# Patient Record
Sex: Male | Born: 1945 | Race: White | Hispanic: No | Marital: Married | Smoking: Former smoker
Health system: Southern US, Community
[De-identification: ages and names within clinical notes are randomized; demographics above are authoritative.]

## PROBLEM LIST (undated history)

## (undated) DIAGNOSIS — M109 Gout, unspecified: Secondary | ICD-10-CM

## (undated) DIAGNOSIS — Z87442 Personal history of urinary calculi: Secondary | ICD-10-CM

## (undated) DIAGNOSIS — J439 Emphysema, unspecified: Secondary | ICD-10-CM

## (undated) DIAGNOSIS — Z974 Presence of external hearing-aid: Secondary | ICD-10-CM

## (undated) DIAGNOSIS — I739 Peripheral vascular disease, unspecified: Secondary | ICD-10-CM

## (undated) DIAGNOSIS — C801 Malignant (primary) neoplasm, unspecified: Secondary | ICD-10-CM

## (undated) DIAGNOSIS — M1990 Unspecified osteoarthritis, unspecified site: Secondary | ICD-10-CM

## (undated) DIAGNOSIS — D494 Neoplasm of unspecified behavior of bladder: Secondary | ICD-10-CM

## (undated) DIAGNOSIS — M75102 Unspecified rotator cuff tear or rupture of left shoulder, not specified as traumatic: Secondary | ICD-10-CM

## (undated) DIAGNOSIS — K4090 Unilateral inguinal hernia, without obstruction or gangrene, not specified as recurrent: Secondary | ICD-10-CM

## (undated) DIAGNOSIS — N189 Chronic kidney disease, unspecified: Secondary | ICD-10-CM

## (undated) DIAGNOSIS — I10 Essential (primary) hypertension: Secondary | ICD-10-CM

## (undated) DIAGNOSIS — K219 Gastro-esophageal reflux disease without esophagitis: Secondary | ICD-10-CM

## (undated) DIAGNOSIS — I2510 Atherosclerotic heart disease of native coronary artery without angina pectoris: Secondary | ICD-10-CM

## (undated) DIAGNOSIS — Z8614 Personal history of Methicillin resistant Staphylococcus aureus infection: Secondary | ICD-10-CM

## (undated) HISTORY — PX: LUMBAR SPINE SURGERY: SHX701

## (undated) HISTORY — DX: Atherosclerotic heart disease of native coronary artery without angina pectoris: I25.10

## (undated) HISTORY — PX: FEMORAL-POPLITEAL BYPASS GRAFT: SHX937

## (undated) HISTORY — PX: AORTA - BILATERAL FEMORAL ARTERY BYPASS GRAFT: SHX1175

---
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
## 1992-04-20 DIAGNOSIS — I739 Peripheral vascular disease, unspecified: Secondary | ICD-10-CM

## 1992-04-20 HISTORY — DX: Peripheral vascular disease, unspecified: I73.9

## 1997-12-26 ENCOUNTER — Ambulatory Visit: Admission: RE | Admit: 1997-12-26 | Discharge: 1997-12-26 | Payer: Self-pay | Admitting: *Deleted

## 1999-02-19 HISTORY — PX: ANTERIOR CERVICAL DECOMP/DISCECTOMY FUSION: SHX1161

## 1999-03-17 ENCOUNTER — Encounter: Payer: Self-pay | Admitting: Neurosurgery

## 1999-03-17 ENCOUNTER — Encounter: Admission: RE | Admit: 1999-03-17 | Discharge: 1999-03-17 | Payer: Self-pay | Admitting: Neurosurgery

## 1999-04-01 ENCOUNTER — Encounter: Payer: Self-pay | Admitting: Neurosurgery

## 1999-04-02 ENCOUNTER — Encounter: Payer: Self-pay | Admitting: Neurosurgery

## 1999-04-02 ENCOUNTER — Observation Stay (HOSPITAL_COMMUNITY): Admission: RE | Admit: 1999-04-02 | Discharge: 1999-04-03 | Payer: Self-pay | Admitting: Neurosurgery

## 1999-04-24 ENCOUNTER — Encounter: Payer: Self-pay | Admitting: Neurosurgery

## 1999-04-24 ENCOUNTER — Encounter: Admission: RE | Admit: 1999-04-24 | Discharge: 1999-04-24 | Payer: Self-pay | Admitting: Neurosurgery

## 1999-05-12 ENCOUNTER — Ambulatory Visit (HOSPITAL_COMMUNITY): Admission: RE | Admit: 1999-05-12 | Discharge: 1999-05-12 | Payer: Self-pay | Admitting: *Deleted

## 1999-05-12 ENCOUNTER — Encounter: Payer: Self-pay | Admitting: *Deleted

## 1999-06-02 ENCOUNTER — Encounter: Payer: Self-pay | Admitting: *Deleted

## 1999-06-02 ENCOUNTER — Encounter: Payer: Self-pay | Admitting: Neurosurgery

## 1999-06-02 ENCOUNTER — Encounter: Admission: RE | Admit: 1999-06-02 | Discharge: 1999-06-02 | Payer: Self-pay | Admitting: Neurosurgery

## 1999-06-03 ENCOUNTER — Inpatient Hospital Stay (HOSPITAL_COMMUNITY): Admission: RE | Admit: 1999-06-03 | Discharge: 1999-06-07 | Payer: Self-pay | Admitting: *Deleted

## 1999-06-03 ENCOUNTER — Encounter: Payer: Self-pay | Admitting: *Deleted

## 1999-06-04 ENCOUNTER — Encounter: Payer: Self-pay | Admitting: *Deleted

## 1999-06-05 ENCOUNTER — Encounter: Payer: Self-pay | Admitting: *Deleted

## 1999-06-06 ENCOUNTER — Encounter: Payer: Self-pay | Admitting: *Deleted

## 2004-11-04 ENCOUNTER — Emergency Department (HOSPITAL_COMMUNITY): Admission: EM | Admit: 2004-11-04 | Discharge: 2004-11-04 | Payer: Self-pay | Admitting: Emergency Medicine

## 2004-11-09 ENCOUNTER — Emergency Department (HOSPITAL_COMMUNITY): Admission: EM | Admit: 2004-11-09 | Discharge: 2004-11-09 | Payer: Self-pay | Admitting: Emergency Medicine

## 2004-12-23 ENCOUNTER — Inpatient Hospital Stay (HOSPITAL_COMMUNITY): Admission: AD | Admit: 2004-12-23 | Discharge: 2004-12-25 | Payer: Self-pay | Admitting: Internal Medicine

## 2004-12-23 ENCOUNTER — Ambulatory Visit: Payer: Self-pay | Admitting: Cardiology

## 2004-12-24 ENCOUNTER — Ambulatory Visit: Payer: Self-pay | Admitting: Cardiology

## 2014-03-05 ENCOUNTER — Emergency Department (HOSPITAL_COMMUNITY): Payer: Medicare Other

## 2014-03-05 ENCOUNTER — Encounter (HOSPITAL_COMMUNITY): Payer: Self-pay | Admitting: Emergency Medicine

## 2014-03-05 ENCOUNTER — Inpatient Hospital Stay (HOSPITAL_COMMUNITY)
Admission: EM | Admit: 2014-03-05 | Discharge: 2014-03-06 | DRG: 287 | Disposition: A | Discharge status: Home / Self Care | Payer: Medicare Other | Attending: Cardiology | Admitting: Cardiology

## 2014-03-05 DIAGNOSIS — I200 Unstable angina: Secondary | ICD-10-CM | POA: Diagnosis present

## 2014-03-05 DIAGNOSIS — K219 Gastro-esophageal reflux disease without esophagitis: Secondary | ICD-10-CM | POA: Diagnosis present

## 2014-03-05 DIAGNOSIS — Z87891 Personal history of nicotine dependence: Secondary | ICD-10-CM

## 2014-03-05 DIAGNOSIS — Z79899 Other long term (current) drug therapy: Secondary | ICD-10-CM | POA: Diagnosis not present

## 2014-03-05 DIAGNOSIS — E785 Hyperlipidemia, unspecified: Secondary | ICD-10-CM | POA: Diagnosis present

## 2014-03-05 DIAGNOSIS — I739 Peripheral vascular disease, unspecified: Secondary | ICD-10-CM | POA: Diagnosis present

## 2014-03-05 DIAGNOSIS — R079 Chest pain, unspecified: Secondary | ICD-10-CM

## 2014-03-05 DIAGNOSIS — Z7982 Long term (current) use of aspirin: Secondary | ICD-10-CM | POA: Diagnosis not present

## 2014-03-05 DIAGNOSIS — I10 Essential (primary) hypertension: Secondary | ICD-10-CM | POA: Diagnosis present

## 2014-03-05 DIAGNOSIS — I25110 Atherosclerotic heart disease of native coronary artery with unstable angina pectoris: Principal | ICD-10-CM | POA: Diagnosis present

## 2014-03-05 DIAGNOSIS — Z8249 Family history of ischemic heart disease and other diseases of the circulatory system: Secondary | ICD-10-CM

## 2014-03-05 HISTORY — DX: Gastro-esophageal reflux disease without esophagitis: K21.9

## 2014-03-05 HISTORY — DX: Essential (primary) hypertension: I10

## 2014-03-05 HISTORY — DX: Peripheral vascular disease, unspecified: I73.9

## 2014-03-05 LAB — CBC WITH DIFFERENTIAL/PLATELET
BASOS ABS: 0.1 10*3/uL (ref 0.0–0.1)
BASOS PCT: 1 % (ref 0–1)
EOS ABS: 0.5 10*3/uL (ref 0.0–0.7)
Eosinophils Relative: 7 % — ABNORMAL HIGH (ref 0–5)
HCT: 49.9 % (ref 39.0–52.0)
Hemoglobin: 17 g/dL (ref 13.0–17.0)
Lymphocytes Relative: 28 % (ref 12–46)
Lymphs Abs: 2 10*3/uL (ref 0.7–4.0)
MCH: 30.9 pg (ref 26.0–34.0)
MCHC: 34.1 g/dL (ref 30.0–36.0)
MCV: 90.6 fL (ref 78.0–100.0)
MONOS PCT: 9 % (ref 3–12)
Monocytes Absolute: 0.6 10*3/uL (ref 0.1–1.0)
NEUTROS ABS: 3.9 10*3/uL (ref 1.7–7.7)
NEUTROS PCT: 55 % (ref 43–77)
PLATELETS: 332 10*3/uL (ref 150–400)
RBC: 5.51 MIL/uL (ref 4.22–5.81)
RDW: 12.6 % (ref 11.5–15.5)
WBC: 7 10*3/uL (ref 4.0–10.5)

## 2014-03-05 LAB — BASIC METABOLIC PANEL
Anion gap: 15 (ref 5–15)
BUN: 18 mg/dL (ref 6–23)
CO2: 24 mEq/L (ref 19–32)
Calcium: 9.7 mg/dL (ref 8.4–10.5)
Chloride: 99 mEq/L (ref 96–112)
Creatinine, Ser: 1.23 mg/dL (ref 0.50–1.35)
GFR, EST AFRICAN AMERICAN: 69 mL/min — AB (ref >90)
GFR, EST NON AFRICAN AMERICAN: 60 mL/min — AB (ref >90)
Glucose, Bld: 102 mg/dL — ABNORMAL HIGH (ref 70–99)
POTASSIUM: 4.3 meq/L (ref 3.7–5.3)
SODIUM: 138 meq/L (ref 137–147)

## 2014-03-05 LAB — I-STAT TROPONIN, ED: TROPONIN I, POC: 0 ng/mL (ref 0.00–0.08)

## 2014-03-05 LAB — TSH: TSH: 1.04 u[IU]/mL (ref 0.350–4.500)

## 2014-03-05 LAB — PROTIME-INR
INR: 1.15 (ref 0.00–1.49)
Prothrombin Time: 14.8 seconds (ref 11.6–15.2)

## 2014-03-05 LAB — TROPONIN I: Troponin I: <0.3 ng/mL (ref <0.30)

## 2014-03-05 MED ORDER — NITROGLYCERIN IN D5W 200-5 MCG/ML-% IV SOLN: 0.0000 ug/min | Freq: Once | INTRAVENOUS | Status: AC

## 2014-03-05 MED ORDER — HEPARIN BOLUS VIA INFUSION
4000.0000 [IU] | Freq: Once | INTRAVENOUS | Status: AC
  Administered 2014-03-05: 4000 [IU] via INTRAVENOUS
  Filled 2014-03-05: qty 4000

## 2014-03-05 MED ORDER — ATORVASTATIN CALCIUM 40 MG PO TABS
40.0000 mg | ORAL_TABLET | Freq: Every day | ORAL | Status: DC
  Administered 2014-03-05: 40 mg via ORAL
  Filled 2014-03-05 (×2): qty 1

## 2014-03-05 MED ORDER — ONDANSETRON HCL 4 MG/2ML IJ SOLN: 4.0000 mg | Freq: Four times a day (QID) | INTRAMUSCULAR | Status: DC | PRN

## 2014-03-05 MED ORDER — ACETAMINOPHEN 325 MG PO TABS
650.0000 mg | ORAL_TABLET | ORAL | Status: DC | PRN
  Administered 2014-03-05 – 2014-03-06 (×2): 650 mg via ORAL
  Filled 2014-03-05 (×2): qty 2

## 2014-03-05 MED ORDER — ASPIRIN 81 MG PO CHEW
324.0000 mg | CHEWABLE_TABLET | ORAL | Status: AC
  Administered 2014-03-06: 324 mg via ORAL
  Filled 2014-03-05: qty 4

## 2014-03-05 MED ORDER — ALPRAZOLAM 0.25 MG PO TABS: 0.2500 mg | ORAL_TABLET | Freq: Two times a day (BID) | ORAL | Status: DC | PRN

## 2014-03-05 MED ORDER — NITROGLYCERIN 0.4 MG SL SUBL
0.4000 mg | SUBLINGUAL_TABLET | SUBLINGUAL | Status: DC | PRN
  Administered 2014-03-05: 0.4 mg via SUBLINGUAL
  Filled 2014-03-05: qty 1

## 2014-03-05 MED ORDER — METOPROLOL TARTRATE 50 MG PO TABS
50.0000 mg | ORAL_TABLET | Freq: Two times a day (BID) | ORAL | Status: DC
  Administered 2014-03-06: 50 mg via ORAL
  Filled 2014-03-05 (×3): qty 1

## 2014-03-05 MED ORDER — ACETAMINOPHEN 325 MG PO TABS: 650.0000 mg | ORAL_TABLET | Freq: Four times a day (QID) | ORAL | Status: DC | PRN

## 2014-03-05 MED ORDER — SODIUM CHLORIDE 0.9 % IJ SOLN: 3.0000 mL | INTRAMUSCULAR | Status: DC | PRN

## 2014-03-05 MED ORDER — NITROGLYCERIN IN D5W 200-5 MCG/ML-% IV SOLN
0.0000 ug/min | Freq: Once | INTRAVENOUS | Status: AC
  Administered 2014-03-05: 50 ug/min via INTRAVENOUS
  Filled 2014-03-05: qty 250

## 2014-03-05 MED ORDER — SODIUM CHLORIDE 0.9 % IV SOLN: 250.0000 mL | INTRAVENOUS | Status: DC | PRN

## 2014-03-05 MED ORDER — NITROGLYCERIN 0.4 MG SL SUBL: 0.4000 mg | SUBLINGUAL_TABLET | SUBLINGUAL | Status: DC | PRN

## 2014-03-05 MED ORDER — HEPARIN (PORCINE) IN NACL 100-0.45 UNIT/ML-% IJ SOLN
1150.0000 [IU]/h | INTRAMUSCULAR | Status: DC
  Administered 2014-03-05 – 2014-03-06 (×2): 1150 [IU]/h via INTRAVENOUS
  Filled 2014-03-05 (×2): qty 250

## 2014-03-05 MED ORDER — SODIUM CHLORIDE 0.9 % IV SOLN
1.0000 mL/kg/h | INTRAVENOUS | Status: DC
  Administered 2014-03-06: 1 mL/kg/h via INTRAVENOUS

## 2014-03-05 MED ORDER — ZOLPIDEM TARTRATE 5 MG PO TABS: 5.0000 mg | ORAL_TABLET | Freq: Every evening | ORAL | Status: DC | PRN

## 2014-03-05 MED ORDER — SODIUM CHLORIDE 0.9 % IJ SOLN
3.0000 mL | Freq: Two times a day (BID) | INTRAMUSCULAR | Status: DC
  Administered 2014-03-05: 3 mL via INTRAVENOUS

## 2014-03-05 MED ORDER — ASPIRIN 325 MG PO TABS: 325.0000 mg | ORAL_TABLET | Freq: Every day | ORAL | Status: DC

## 2014-03-05 MED ORDER — PANTOPRAZOLE SODIUM 40 MG PO TBEC
40.0000 mg | DELAYED_RELEASE_TABLET | Freq: Every day | ORAL | Status: DC
  Administered 2014-03-06: 40 mg via ORAL
  Filled 2014-03-05: qty 1

## 2014-03-05 NOTE — ED Notes (Signed)
Cardiology at bedside.

## 2014-03-05 NOTE — ED Notes (Addendum)
Per EMS, pt comes from home with c/o central chest pain radiating to left side and back. Pt A&OX4, NAD noted. Pt started having chest pain last night and this morning around 10am. Pt has taken 324mg  ASA, 2 nitro tabs before he arrived. Pt has h/o HTN, GERD. VSS. 12 lead unremarkable. 18G placed in left AC.

## 2014-03-05 NOTE — ED Notes (Signed)
X-ray at bedside

## 2014-03-05 NOTE — H&P (Signed)
History and Physical   Patient ID: Manuel Barrett MRN: [id number], DOB/AGE: 1945-10-28 68 y.o. Date of Encounter: 03/05/2014  Primary Physician: [facility] Primary Cardiologist:   Chief Complaint:  Chest pain  HPI: Manuel Barrett is a 68 y.o. male with a history of PAD and non-obstructive CAD.   He had onset of substernal chest pain this am at about 10:00 am. It started with moderate exertion. It was a 4/10. It was an aching pain. When he sat down, the pain would go to his back, but then resolve. There was no other radiation. There was no associated SOB, N&V or diaphoresis. He had similar symptoms last pm that started at rest, but they resolved.   Today, he rested and chest pain resolved. He walked to the mailbox and had recurrent pain. His wife and son insisted he come to the ER. In the ER, he was having pain, it resolved with SL NTG x 1, but recurred. It again resolved with a single SL NTG. He was put on a NTG gtt and the pain has not returned.   Past Medical History  Diagnosis Date  . Hypertension   . GERD (gastroesophageal reflux disease)   . PAD (peripheral artery disease) 1994    Surgical History:  Past Surgical History  Procedure Laterality Date  . Femoral-popliteal bypass graft  1994    Dr. Amedeo Plenty  . 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 Back surgery       I have reviewed the patient's current medications. Medication Sig  acetaminophen (TYLENOL) 500 MG tablet Take 1,500 mg by mouth every 6 (six) hours as needed for mild pain or headache.  aspirin 325 MG tablet Take 325 mg by mouth daily.  metoprolol (LOPRESSOR) 50 MG tablet Take 50 mg by mouth 2 (two) times daily.  omeprazole (PRILOSEC) 20 MG capsule Take 20 mg by mouth daily.  triamterene-hydrochlorothiazide (MAXZIDE-25) 37.5-25 MG per tablet Take 1 tablet by mouth daily.  nitroGLYCERIN (NITROSTAT) 0.4 MG SL tablet Place 0.4 mg under the tongue every 5 (five) minutes as needed for chest pain.    Scheduled Meds:  Continuous Infusions:  PRN Meds:.nitroGLYCERIN  Allergies: No Known Allergies  History   Social History  . Marital Status: Married    Spouse Name: N/A    Number of Children: N/A  . Years of Education: N/A   Occupational History  . Spinner operator    Social History Main Topics  . Smoking status: Former Smoker    Quit date: 04/20/1992  . Smokeless tobacco: Not on file  . Alcohol Use: No  . Drug Use: No  . Sexual Activity: Not on file   Other Topics Concern  . Not on file   Social History Narrative   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    Family Status  Relation Status Death Age  . Father Deceased 11    CHF, COPD  . Mother Alive born 46    Osteoporosis  . Sister Alive     No known CAD    Review of Systems: Some numbness in his feet at times. He has GERD, symptoms controlled with OTC meds. He had some dark stools last week, but has not had this week. Has had some urinary problems (?enlarged prostate).  Full 14-point review of systems otherwise negative except as noted above.  Physical Exam: Blood pressure 99/57, pulse 78, temperature 98.3 F (36.8 C), temperature source Oral, resp.  rate 19, height 5\' 10"  (1.778 m), weight 185 lb (83.915 kg), SpO2 92 %. General: Well developed, well nourished,male in no acute distress. Head: Normocephalic, atraumatic, sclera non-icteric, no xanthomas, nares are without discharge. Dentition:  Neck: No carotid bruits. JVD not elevated. No thyromegally Lungs: Good expansion bilaterally. without wheezes or rhonchi.  Heart: Regular rate and rhythm with S1 S2.  No S3 or S4.  No murmur, no rubs, or gallops appreciated. Abdomen: Soft, non-tender, non-distended with normoactive bowel sounds. No hepatomegaly. No rebound/guarding. No obvious abdominal masses. Msk:  Strength and tone appear normal for age. No joint deformities or effusions, no spine or costo-vertebral angle tenderness. Extremities: No  clubbing or cyanosis. No edema.  Distal pedal pulses are 2+ in 3/4 extrem, slightly decreased in L DP. Bilateral femoral bruits noted, femoral pulses 2-3+ Neuro: Alert and oriented X 3. Moves all extremities spontaneously. No focal deficits noted. Psych:  Responds to questions appropriately with a normal affect. Skin: No rashes or lesions noted  Labs:   Lab Results  Component Value Date   WBC 7.0 03/05/2014   HGB 17.0 03/05/2014   HCT 49.9 03/05/2014   MCV 90.6 03/05/2014   PLT 332 03/05/2014     Recent Labs Lab 03/05/14 1325  NA 138  K 4.3  CL 99  CO2 24  BUN 18  CREATININE 1.23  CALCIUM 9.7  GLUCOSE 102*    Recent Labs  03/05/14 1415  TROPIPOC 0.00   Radiology/Studies: Dg Chest Port 1 View 03/05/2014   CLINICAL DATA:  Left-sided chest pain started about 4 hr ago  EXAM: PORTABLE CHEST - 1 VIEW  COMPARISON:  None.  FINDINGS: The heart size and mediastinal contours are within normal limits. Both lungs are clear. The visualized skeletal structures are unremarkable.  IMPRESSION: No active disease.   Electronically Signed   By: Kathreen Devoid   On: 03/05/2014 14:25   ECG: 03/05/2014 SR, rate 62, minimal J point elevation V2&3; No old available for comparison.  ASSESSMENT AND PLAN:  Principal Problem:   Unstable angina pectoris - admit, r/o MI, continue rx. Add statin, continue ASA, BB. Ck lipids, LFTs.  The risks and benefits of a cardiac catheterization including, but not limited to, death, stroke, MI, kidney damage and bleeding were discussed with the patient who indicates understanding and agrees to proceed.   Jonetta Speak, PA-C 03/05/2014 3:35 PM Beeper [phone]  Patient seen and examined and history reviewed. Agree with above findings and plan. 68 yo WM with known history of PAD, HTN and prior smoking. Presents with typical history for unstable angina. Pain began last night then resolved. Returned today with exertion. Now with pain at rest relieved with IV  Ntg. Remote cath in 2006 with nonobstructive disease. Ecg without acute changes. Exam benign. Will admit- cycle cardiac enzymes and Ecgs. Start statin therapy. Continue ASA and beta blocker. Start IV heparin and Ntg. Plan cardiac cath tomorrow. The procedure and risks were reviewed including but not limited to death, myocardial infarction, stroke, arrythmias, bleeding, transfusion, emergency surgery, dye allergy, or renal dysfunction. The patient voices understanding and is agreeable to proceed.Marland Kitchen   Wilian Kwong [place], [place] 03/05/2014 3:37 PM

## 2014-03-05 NOTE — Progress Notes (Signed)
ANTICOAGULATION CONSULT NOTE - Initial Consult  Pharmacy Consult for Heparin Indication: chest pain/ACS  No Known Allergies  Patient Measurements: Height: 5\' 10"  (177.8 cm) Weight: 185 lb (83.915 kg) IBW/kg (Calculated) : 73 Heparin Dosing Weight: 83.9 kg  Vital Signs: Temp: 98.3 F (36.8 C) (11/16 1323) Temp Source: Oral (11/16 1323) BP: 105/81 mmHg (11/16 1715) Pulse Rate: 69 (11/16 1715)  Labs:  Recent Labs  03/05/14 1325  HGB 17.0  HCT 49.9  PLT 332  CREATININE 1.23    Estimated Creatinine Clearance: 61.8 mL/min (by C-G formula based on Cr of 1.23).   Medical History: Past Medical History  Diagnosis Date  . Hypertension   . GERD (gastroesophageal reflux disease)   . PAD (peripheral artery disease) 1994    Medications:  Prescriptions prior to admission  Medication Sig Dispense Refill Last Dose  . acetaminophen (TYLENOL) 500 MG tablet Take 1,500 mg by mouth every 6 (six) hours as needed for mild pain or headache.   Past Month at Unknown time  . aspirin 325 MG tablet Take 325 mg by mouth daily.   03/05/2014 at Unknown time  . metoprolol (LOPRESSOR) 50 MG tablet Take 50 mg by mouth 2 (two) times daily.   03/05/2014 at 0700  . omeprazole (PRILOSEC) 20 MG capsule Take 20 mg by mouth daily.   03/05/2014 at Unknown time  . triamterene-hydrochlorothiazide (MAXZIDE-25) 37.5-25 MG per tablet Take 1 tablet by mouth daily.   03/05/2014 at Unknown time  . nitroGLYCERIN (NITROSTAT) 0.4 MG SL tablet Place 0.4 mg under the tongue every 5 (five) minutes as needed for chest pain.   Given By EMS at Unknown time    Assessment: 71 YOM with history of PAD and non-obstructive CAD admitted with recurrent substernal chest pain to start IV heparin per pharmacy consult. Plan for cath on 03/06/14. H/H/platelets are within normal limits. SCr is up at 1.23 with estimated CrCl ~ 60-65 mL/min. He was not on anticoagulation prior to admission.   Goal of Therapy:  Heparin level 0.3-0.7  units/ml Monitor platelets by anticoagulation protocol: Yes   Plan:  1. Heparin bolus of 4000 units x1.  2. Heparin drip at 1150 units/hr.  3. Heparin level in 6 hours.  4. Daily heparin level and CBC.   Sloan Leiter, PharmD, BCPS Clinical Pharmacist [phone] 03/05/2014,5:58 PM

## 2014-03-05 NOTE — ED Provider Notes (Signed)
CSN: [id number]     Arrival date & time 03/05/14  1312 History   First MD Initiated Contact with Patient 03/05/14 1320     Chief Complaint  Patient presents with  . Chest Pain      HPI  Patient presents for evaluation of chest, back, and shoulder pain. His episode today was at 10 AM. He was sitting at his home. He walked to the kitchen. The distance of less than 30 feet. He started having pain in his left chest described as an ache. He states that this persisted. He walked back in a sat down. His chest pain resolved. However he began feeling pain in his shoulder, and left arm. This resolved.  However, less than a half later he got up again. He was going to walk up to get his mail. He had worsening left sided chest "ache". Paramedics were called after this persisted and recurred several times over the next few hours. He was given nitroglycerin en route and his pain resolved. It started to recurred he was given a second nitroglycerin, and 324 mg aspirin.  Had angiogram in 06 which she states showed "40% blockages". Has history of aortobifemoral bypass in 69, with revision in 01. History of hypertension. Not diabetic. Family history with his father having an MI.   Past Medical History  Diagnosis Date  . Hypertension   . GERD (gastroesophageal reflux disease)   . PAD (peripheral artery disease) 1994   Past Surgical History  Procedure Laterality Date  . Femoral-popliteal bypass graft  1994    Dr. Amedeo Plenty  . 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 Back surgery     History reviewed. No pertinent family history. History  Substance Use Topics  . Smoking status: Former Smoker    Quit date: 04/20/1992  . Smokeless tobacco: Not on file  . Alcohol Use: No    Review of Systems  Constitutional: Negative for fever, chills, diaphoresis, appetite change and fatigue.  HENT: Negative for mouth sores, sore throat and trouble swallowing.   Eyes: Negative for visual disturbance.   Respiratory: Positive for chest tightness. Negative for cough, shortness of breath and wheezing.   Cardiovascular: Positive for chest pain.  Gastrointestinal: Negative for nausea, vomiting, abdominal pain, diarrhea and abdominal distention.  Endocrine: Negative for polydipsia, polyphagia and polyuria.  Genitourinary: Negative for dysuria, frequency and hematuria.  Musculoskeletal: Negative for gait problem.  Skin: Negative for color change, pallor and rash.  Neurological: Negative for dizziness, syncope, light-headedness and headaches.  Hematological: Does not bruise/bleed easily.  Psychiatric/Behavioral: Negative for behavioral problems and confusion.      Allergies  Review of patient's allergies indicates no known allergies.  Home Medications   Prior to Admission medications   Medication Sig Start Date End Date Taking? Authorizing Provider  acetaminophen (TYLENOL) 500 MG tablet Take 1,500 mg by mouth every 6 (six) hours as needed for mild pain or headache.   Yes Historical Provider, MD  aspirin 325 MG tablet Take 325 mg by mouth daily.   Yes Historical Provider, MD  metoprolol (LOPRESSOR) 50 MG tablet Take 50 mg by mouth 2 (two) times daily.   Yes Historical Provider, MD  omeprazole (PRILOSEC) 20 MG capsule Take 20 mg by mouth daily.   Yes Historical Provider, MD  triamterene-hydrochlorothiazide (MAXZIDE-25) 37.5-25 MG per tablet Take 1 tablet by mouth daily.   Yes Historical Provider, MD  nitroGLYCERIN (NITROSTAT) 0.4 MG SL tablet Place 0.4 mg under the tongue every 5 (  five) minutes as needed for chest pain.    Historical Provider, MD   BP 99/57 mmHg  Pulse 78  Temp(Src) 98.3 F (36.8 C) (Oral)  Resp 19  Ht 5\' 10"  (1.778 m)  Wt 185 lb (83.915 kg)  BMI 26.54 kg/m2  SpO2 92% Physical Exam  Constitutional: He is oriented to person, place, and time. He appears well-developed and well-nourished. No distress.  HENT:  Head: Normocephalic.  Eyes: Conjunctivae are normal. Pupils  are equal, round, and reactive to light. No scleral icterus.  Neck: Normal range of motion. Neck supple. No thyromegaly present.  Cardiovascular: Normal rate and regular rhythm.  Exam reveals no gallop and no friction rub.   No murmur heard. Pulmonary/Chest: Effort normal and breath sounds normal. No respiratory distress. He has no wheezes. He has no rales.  Abdominal: Soft. Bowel sounds are normal. He exhibits no distension. There is no tenderness. There is no rebound.  Musculoskeletal: Normal range of motion.  Neurological: He is alert and oriented to person, place, and time.  Skin: Skin is warm and dry. No rash noted.  Psychiatric: He has a normal mood and affect. His behavior is normal.    ED Course  Procedures (including critical care time) Labs Review Labs Reviewed  CBC WITH DIFFERENTIAL - Abnormal; Notable for the following:    Eosinophils Relative 7 (*)    All other components within normal limits  BASIC METABOLIC PANEL - Abnormal; Notable for the following:    Glucose, Bld 102 (*)    GFR calc non Af Amer 60 (*)    GFR calc Af Amer 69 (*)    All other components within normal limits  Randolm Idol, ED    Imaging Review Dg Chest Port 1 View  03/05/2014   CLINICAL DATA:  Left-sided chest pain started about 4 hr ago  EXAM: PORTABLE CHEST - 1 VIEW  COMPARISON:  None.  FINDINGS: The heart size and mediastinal contours are within normal limits. Both lungs are clear. The visualized skeletal structures are unremarkable.  IMPRESSION: No active disease.   Electronically Signed   By: Kathreen Devoid   On: 03/05/2014 14:25     EKG Interpretation   Date/Time:  Monday March 05 2014 13:48:56 EST Ventricular Rate:  72 PR Interval:  182 QRS Duration: 94 QT Interval:  386 QTC Calculation: 422 R Axis:   46 Text Interpretation:  Sinus rhythm nonspicific repolarization abnormality  Confirmed by Jeneen Rinks  MD, Kimberly ([id number]) on 03/05/2014 2:34:10 PM      MDM   Final diagnoses:   Chest pain  Unstable angina    A very suspicious story. Heart score of 6. Will give sublingual nitroglycerin followed by drip. Await first enzymes.  EKG and repeat showed no acute abnormalities. Initial troponin 0. Titrated on nitroglycerin and pain free. Being admitted by cardiology.    Tanna Furry, MD 03/05/14 [phone]

## 2014-03-06 ENCOUNTER — Encounter (HOSPITAL_COMMUNITY)
Admission: EM | Disposition: A | Discharge status: Home / Self Care | Payer: Self-pay | Source: Home / Self Care | Attending: Cardiology

## 2014-03-06 ENCOUNTER — Ambulatory Visit (HOSPITAL_COMMUNITY): Admit: 2014-03-06 | Payer: Self-pay | Admitting: Cardiology

## 2014-03-06 DIAGNOSIS — I25110 Atherosclerotic heart disease of native coronary artery with unstable angina pectoris: Principal | ICD-10-CM

## 2014-03-06 HISTORY — PX: LEFT HEART CATHETERIZATION WITH CORONARY ANGIOGRAM: SHX5451

## 2014-03-06 LAB — TROPONIN I: Troponin I: <0.3 ng/mL (ref <0.30)

## 2014-03-06 LAB — COMPREHENSIVE METABOLIC PANEL
ALK PHOS: 89 U/L (ref 39–117)
ALT: 18 U/L (ref 0–53)
ANION GAP: 13 (ref 5–15)
AST: 18 U/L (ref 0–37)
Albumin: 3.2 g/dL — ABNORMAL LOW (ref 3.5–5.2)
BUN: 21 mg/dL (ref 6–23)
CO2: 24 meq/L (ref 19–32)
Calcium: 9 mg/dL (ref 8.4–10.5)
Chloride: 100 mEq/L (ref 96–112)
Creatinine, Ser: 1.3 mg/dL (ref 0.50–1.35)
GFR, EST AFRICAN AMERICAN: 65 mL/min — AB (ref >90)
GFR, EST NON AFRICAN AMERICAN: 56 mL/min — AB (ref >90)
GLUCOSE: 118 mg/dL — AB (ref 70–99)
POTASSIUM: 3.9 meq/L (ref 3.7–5.3)
Sodium: 137 mEq/L (ref 137–147)
TOTAL PROTEIN: 6.7 g/dL (ref 6.0–8.3)
Total Bilirubin: 0.4 mg/dL (ref 0.3–1.2)

## 2014-03-06 LAB — HEMOGLOBIN A1C
Hgb A1c MFr Bld: 6.4 % — ABNORMAL HIGH (ref <5.7)
MEAN PLASMA GLUCOSE: 137 mg/dL — AB (ref <117)

## 2014-03-06 LAB — LIPID PANEL
CHOL/HDL RATIO: 7.7 ratio
Cholesterol: 193 mg/dL (ref 0–200)
HDL: 25 mg/dL — AB (ref >39)
LDL Cholesterol: 138 mg/dL — ABNORMAL HIGH (ref 0–99)
Triglycerides: 148 mg/dL (ref <150)
VLDL: 30 mg/dL (ref 0–40)

## 2014-03-06 LAB — HEPARIN LEVEL (UNFRACTIONATED)
HEPARIN UNFRACTIONATED: 0.47 [IU]/mL (ref 0.30–0.70)
Heparin Unfractionated: 0.45 IU/mL (ref 0.30–0.70)

## 2014-03-06 SURGERY — LEFT HEART CATHETERIZATION WITH CORONARY ANGIOGRAM
Anesthesia: LOCAL

## 2014-03-06 MED ORDER — FENTANYL CITRATE 0.05 MG/ML IJ SOLN
INTRAMUSCULAR | Status: AC
  Filled 2014-03-06: qty 2

## 2014-03-06 MED ORDER — MIDAZOLAM HCL 2 MG/2ML IJ SOLN
INTRAMUSCULAR | Status: AC
  Filled 2014-03-06: qty 2

## 2014-03-06 MED ORDER — LIDOCAINE HCL (PF) 1 % IJ SOLN
INTRAMUSCULAR | Status: AC
  Filled 2014-03-06: qty 30

## 2014-03-06 MED ORDER — HEPARIN SODIUM (PORCINE) 1000 UNIT/ML IJ SOLN
INTRAMUSCULAR | Status: AC
  Filled 2014-03-06: qty 1

## 2014-03-06 MED ORDER — SODIUM CHLORIDE 0.9 % IV SOLN: 1.0000 mL/kg/h | INTRAVENOUS | Status: DC

## 2014-03-06 MED ORDER — NITROGLYCERIN IN D5W 200-5 MCG/ML-% IV SOLN
0.0000 ug/min | Freq: Once | INTRAVENOUS | Status: AC
Start: 2014-03-06 — End: 2014-03-06
  Administered 2014-03-06: 20 ug/min via INTRAVENOUS

## 2014-03-06 MED ORDER — NITROGLYCERIN 1 MG/10 ML FOR IR/CATH LAB
INTRA_ARTERIAL | Status: AC
  Filled 2014-03-06: qty 10

## 2014-03-06 MED ORDER — ATORVASTATIN CALCIUM 40 MG PO TABS: 40.0000 mg | ORAL_TABLET | Freq: Every day | ORAL | Status: DC

## 2014-03-06 MED ORDER — VERAPAMIL HCL 2.5 MG/ML IV SOLN
INTRAVENOUS | Status: AC
  Filled 2014-03-06: qty 2

## 2014-03-06 MED ORDER — HEPARIN (PORCINE) IN NACL 2-0.9 UNIT/ML-% IJ SOLN
INTRAMUSCULAR | Status: AC
  Filled 2014-03-06: qty 1500

## 2014-03-06 MED ORDER — NITROGLYCERIN IN D5W 200-5 MCG/ML-% IV SOLN
INTRAVENOUS | Status: AC
  Filled 2014-03-06: qty 250

## 2014-03-06 NOTE — Plan of Care (Signed)
Problem: Phase I Progression Outcomes Goal: Hemodynamically stable Outcome: Completed/Met Date Met:  03/06/14 Goal: Anginal pain relieved Outcome: Completed/Met Date Met:  03/06/14 Goal: Aspirin unless contraindicated Outcome: Completed/Met Date Met:  03/06/14 Goal: Voiding-avoid urinary catheter unless indicated Outcome: Completed/Met Date Met:  03/06/14  Problem: Phase II Progression Outcomes Goal: Hemodynamically stable Outcome: Completed/Met Date Met:  03/06/14 Goal: Anginal pain relieved Outcome: Completed/Met Date Met:  03/06/14 Goal: Cath/PCI Day Path if indicated Outcome: Completed/Met Date Met:  03/06/14 Goal: CV Risk Factors identified Outcome: Completed/Met Date Met:  03/06/14 Goal: Cardiac Rehab if ordered Outcome: Not Applicable Date Met:  03/06/14  Problem: Phase III Progression Outcomes Goal: Hemodynamically stable Outcome: Completed/Met Date Met:  03/06/14 Goal: No anginal pain Outcome: Completed/Met Date Met:  03/06/14 Goal: Cath/PCI Path as indicated Outcome: Completed/Met Date Met:  03/06/14 Goal: Vascular site scale level 0 - I Vascular Site Scale Level 0: No bruising/bleeding/hematoma Level I (Mild): Bruising/Ecchymosis, minimal bleeding/ooozing, palpable hematoma < 3 cm Level II (Moderate): Bleeding not affecting hemodynamic parameters, pseudoaneurysm, palpable hematoma > 3 cm Level III (Severe) Bleeding which affects hemodynamic parameters or retroperitoneal hemorrhage  Outcome: Completed/Met Date Met:  03/06/14 Goal: Discharge plan remains appropriate-arrangements made Outcome: Completed/Met Date Met:  03/06/14 Goal: Tolerating diet Outcome: Completed/Met Date Met:  03/06/14 Goal: If positive for MI, change to MI Path Outcome: Not Applicable Date Met:  03/06/14  Problem: Discharge Progression Outcomes Goal: No anginal pain Outcome: Completed/Met Date Met:  03/06/14 Goal: Hemodynamically stable Outcome: Completed/Met Date Met:  03/06/14 Goal:  Complications resolved/controlled Outcome: Not Applicable Date Met:  03/06/14 Goal: Barriers To Progression Addressed/Resolved Outcome: Not Applicable Date Met:  03/06/14 Goal: Discharge plan in place and appropriate Outcome: Completed/Met Date Met:  03/06/14 Goal: Vascular site scale level 0 - I Vascular Site Scale Level 0: No bruising/bleeding/hematoma Level I (Mild): Bruising/Ecchymosis, minimal bleeding/ooozing, palpable hematoma < 3 cm Level II (Moderate): Bleeding not affecting hemodynamic parameters, pseudoaneurysm, palpable hematoma > 3 cm Level III (Severe) Bleeding which affects hemodynamic parameters or retroperitoneal hemorrhage  Outcome: Completed/Met Date Met:  03/06/14 Goal: Tolerates diet Outcome: Completed/Met Date Met:  03/06/14 Goal: Activity appropriate for discharge plan Outcome: Completed/Met Date Met:  03/06/14     

## 2014-03-06 NOTE — Discharge Instructions (Signed)
PLEASE REMEMBER TO BRING ALL OF YOUR MEDICATIONS TO EACH OF YOUR FOLLOW-UP OFFICE VISITS. ° °PLEASE ATTEND ALL SCHEDULED FOLLOW-UP APPOINTMENTS.  ° °Activity: Increase activity slowly as tolerated. You may shower, but no soaking baths (or swimming) for 1 week. No driving for 2 days. No lifting over 5 lbs for 1 week. No sexual activity for 1 week.  ° °You May Return to Work: in 1 week (if applicable) ° °Wound Care: You may wash cath site gently with soap and water. Keep cath site clean and dry. If you notice pain, swelling, bleeding or pus at your cath site, please call [phone]. ° ° ° °Cardiac Cath Site Care °Refer to this sheet in the next few weeks. These instructions provide you with information on caring for yourself after your procedure. Your caregiver may also give you more specific instructions. Your treatment has been planned according to current medical practices, but problems sometimes occur. Call your caregiver if you have any problems or questions after your procedure. °HOME CARE INSTRUCTIONS °· You may shower 24 hours after the procedure. Remove the bandage (dressing) and gently wash the site with plain soap and water. Gently pat the site dry.  °· Do not apply powder or lotion to the site.  °· Do not sit in a bathtub, swimming pool, or whirlpool for 5 to 7 days.  °· No bending, squatting, or lifting anything over 10 pounds (4.5 kg) as directed by your caregiver.  °· Inspect the site at least twice daily.  °· Do not drive home if you are discharged the same day of the procedure. Have someone else drive you.  °· You may drive 24 hours after the procedure unless otherwise instructed by your caregiver.  °What to expect: °· Any bruising will usually fade within 1 to 2 weeks.  °· Blood that collects in the tissue (hematoma) may be painful to the touch. It should usually decrease in size and tenderness within 1 to 2 weeks.  °SEEK IMMEDIATE MEDICAL CARE IF: °· You have unusual pain at the site or down the  affected limb.  °· You have redness, warmth, swelling, or pain at the site.  °· You have drainage (other than a small amount of blood on the dressing).  °· You have chills.  °· You have a fever or persistent symptoms for more than 72 hours.  °· You have a fever and your symptoms suddenly get worse.  °· Your leg becomes pale, cool, tingly, or numb.  °· You have heavy bleeding from the site. Hold pressure on the site.  °Document Released: 05/09/2010 Document Revised: 03/26/2011 Document Reviewed: 05/09/2010 °ExitCare® Patient Information ©2012 [employer]. ° °

## 2014-03-06 NOTE — Progress Notes (Signed)
UR Completed Aydin Hink Graves-Bigelow, RN,BSN [phone]  

## 2014-03-06 NOTE — Progress Notes (Addendum)
[facility] for Heparin Indication: chest pain/ACS  No Known Allergies  Patient Measurements: Height: 5\' 10"  (177.8 cm) Weight: 185 lb (83.915 kg) IBW/kg (Calculated) : 73 Heparin Dosing Weight: 83.9 kg  Vital Signs: Temp: 98 F (36.7 C) (11/17 0013) Temp Source: Oral (11/17 0013) BP: 97/62 mmHg (11/17 0013) Pulse Rate: 72 (11/17 0013)  Labs:  Recent Labs  03/05/14 1325 03/05/14 1907 03/06/14 0026  HGB 17.0  --   --   HCT 49.9  --   --   PLT 332  --   --   LABPROT  --  14.8  --   INR  --  1.15  --   HEPARINUNFRC  --   --  0.45  CREATININE 1.23  --   --   TROPONINI  --  <0.30 <0.30    Estimated Creatinine Clearance: 61.8 mL/min (by C-G formula based on Cr of 1.23).  Assessment: 68 y.o. male with chest pain for heparin   Goal of Therapy:  Heparin level 0.3-0.7 units/ml Monitor platelets by anticoagulation protocol: Yes   Plan:  Continue Heparin at current rate Follow-up am labs.  Phillis Knack, PharmD, BCPS   03/06/2014,1:39 AM   Addendum  -Morning heparin level is therapeutic -Continue heparin at 1150 units/hr -Cardiac cath this afternoon -Monitor daily HL and CBC while on heparin    Hughes Better, PharmD, BCPS Clinical Pharmacist Pager: [phone] 03/06/2014 8:21 AM

## 2014-03-06 NOTE — CV Procedure (Signed)
    Cardiac Catheterization Procedure Note  Name: Manuel Barrett MRN: [id number] DOB: [birth year]  Procedure: Left Heart Cath, Selective Coronary Angiography, LV angiography  Indication: 68 yo WM with history of HTN, PAD, and HL presents with symptoms consistent with unstable angina.   Procedural Details: The right wrist was prepped, draped, and anesthetized with 1% lidocaine. Using the modified Seldinger technique, a 6 French slender sheath was introduced into the right radial artery. 3 mg of verapamil was administered through the sheath, weight-based unfractionated heparin was administered intravenously. Standard Judkins catheters were used for selective coronary angiography and left ventriculography. Catheter exchanges were performed over an exchange length guidewire. There were no immediate procedural complications. A TR band was used for radial hemostasis at the completion of the procedure.  The patient was transferred to the post catheterization recovery area for further monitoring.  Procedural Findings: Hemodynamics: AO 122/54 mean 83 mm Hg LV 127/9 mm Hg  Coronary angiography: Coronary dominance: right  Left mainstem: Normal  Left anterior descending (LAD): The LAD is tortuous with a focal 50-60% stenosis in the mid vessel. The first diagonal is large with 20-30% narrowing.  Left circumflex (LCx): 30% mid vessel disease  Right coronary artery (RCA): The RCA has a 30% proximal stenosis. There is irregular plaque in the mid and distal vessel up to 20%.   Left ventriculography: Left ventricular systolic function is normal, LVEF is estimated at 55-65%, there is no significant mitral regurgitation   Final Conclusions:   1. Moderate nonobstructive CAD 2. Normal LV function  Recommendations: Medical management with ASA, beta blocker, and statin. Patient may be discharged today post procedure if no complications.  [name], [place]  03/06/2014, 5:19 PM

## 2014-03-06 NOTE — H&P (View-Only) (Signed)
Subjective: No further CP.  No SOB  Objective: Vital signs in last 24 hours: Temp:  [97.6 F (36.4 C)-98.3 F (36.8 C)] 97.9 F (36.6 C) (11/17 0413) Pulse Rate:  [65-81] 65 (11/17 0413) Resp:  [15-23] 18 (11/17 0413) BP: (88-146)/(47-85) 98/61 mmHg (11/17 0413) SpO2:  [92 %-97 %] 93 % (11/17 0413) Weight:  [178 lb 3.2 oz (80.831 kg)-185 lb (83.915 kg)] 178 lb 3.2 oz (80.831 kg) (11/17 0413) Last BM Date: 03/05/14  Intake/Output from previous day: 11/16 0701 - 11/17 0700 In: 645.1 [P.O.:240; I.V.:405.1] Out: -  Intake/Output this shift:    Medications Current Facility-Administered Medications  Medication Dose Route Frequency Provider Last Rate Last Dose  . 0.9 %  sodium chloride infusion  250 mL Intravenous PRN Rhonda G Barrett, PA-C      . 0.9 %  sodium chloride infusion  1 mL/kg/hr Intravenous Continuous Evelene Croon Barrett, PA-C 83.9 mL/hr at 03/06/14 0348 1 mL/kg/hr at 03/06/14 0348  . acetaminophen (TYLENOL) tablet 650 mg  650 mg Oral Q4H PRN Evelene Croon Barrett, PA-C   650 mg at 03/05/14 1917  . ALPRAZolam (XANAX) tablet 0.25 mg  0.25 mg Oral BID PRN Lonn Georgia, PA-C      . [START ON 03/07/2014] aspirin tablet 325 mg  325 mg Oral Daily Rhonda G Barrett, PA-C      . atorvastatin (LIPITOR) tablet 40 mg  40 mg Oral q1800 Rhonda G Barrett, PA-C   40 mg at 03/05/14 2134  . heparin ADULT infusion 100 units/mL (25000 units/250 mL)  1,150 Units/hr Intravenous Continuous Brain Hilts, RPH 11.5 mL/hr at 03/05/14 1828 1,150 Units/hr at 03/05/14 1828  . metoprolol tartrate (LOPRESSOR) tablet 50 mg  50 mg Oral BID Evelene Croon Barrett, PA-C   50 mg at 03/05/14 1822  . nitroGLYCERIN (NITROSTAT) SL tablet 0.4 mg  0.4 mg Sublingual Q5 Min x 3 PRN Rhonda G Barrett, PA-C      . ondansetron (ZOFRAN) injection 4 mg  4 mg Intravenous Q6H PRN Rhonda G Barrett, PA-C      . pantoprazole (PROTONIX) EC tablet 40 mg  40 mg Oral Daily Rhonda G Barrett, PA-C   40 mg at 03/05/14 1821  . sodium  chloride 0.9 % injection 3 mL  3 mL Intravenous Q12H Rhonda G Barrett, PA-C   3 mL at 03/05/14 2144  . sodium chloride 0.9 % injection 3 mL  3 mL Intravenous PRN Rhonda G Barrett, PA-C      . zolpidem (AMBIEN) tablet 5 mg  5 mg Oral QHS PRN Lonn Georgia, PA-C        PE: General appearance: alert, cooperative and no distress Lungs: clear to auscultation bilaterally Heart: regular rate and rhythm, S1, S2 normal, no murmur, click, rub or gallop Extremities: No LEE Pulses: 2+ and symmetric Skin: WArm and dry Neurologic: Grossly normal  Lab Results:   Recent Labs  03/05/14 1325  WBC 7.0  HGB 17.0  HCT 49.9  PLT 332   BMET  Recent Labs  03/05/14 1325 03/06/14 0616  NA 138 137  K 4.3 3.9  CL 99 100  CO2 24 24  GLUCOSE 102* 118*  BUN 18 21  CREATININE 1.23 1.30  CALCIUM 9.7 9.0   PT/INR  Recent Labs  03/05/14 1907  LABPROT 14.8  INR 1.15   Cholesterol  Recent Labs  03/06/14 0616  CHOL 193   Lipid Panel     Component Value Date/Time   CHOL  193 03/06/2014 0616   TRIG 148 03/06/2014 0616   HDL 25* 03/06/2014 0616   CHOLHDL 7.7 03/06/2014 0616   VLDL 30 03/06/2014 0616   LDLCALC 138* 03/06/2014 0616   Cardiac Panel (last 3 results)  Recent Labs  03/05/14 1907 03/06/14 0026 03/06/14 0616  TROPONINI <0.30 <0.30 <0.30     Assessment/Plan  68 y.o. male with a history of PAD and non-obstructive CAD.   He had onset of substernal chest pain yesterday at about 10:00 am. Principal Problem:   Unstable angina pectoris Active Problems:   Unstable angina  Plan:  No further CP.  Ruled out for MI.  Left heart cath today at 1500hrs.   ASA, lopressor 50bid, statin, IV heparin and NTG.  BP soft.   SCr stable.   LOS: 1 day    HAGER, BRYAN PA-C 03/06/2014 7:43 AM  I have examined the patient and reviewed assessment and plan and discussed with patient.  Agree with above as stated.  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 Unstable  angina. Plan for cath.  COntinue heparin, IV NTG.  Sx controlled with the IV meds.  Higher risk for CAD given PAD.  Scarleth Brame S.

## 2014-03-06 NOTE — Interval H&P Note (Signed)
History and Physical Interval Note:  03/06/2014 4:59 PM  Manuel Barrett  has presented today for surgery, with the diagnosis of c/p  The various methods of treatment have been discussed with the patient and family. After consideration of risks, benefits and other options for treatment, the patient has consented to  Procedure(s): LEFT HEART CATHETERIZATION WITH CORONARY ANGIOGRAM (N/A) as a surgical intervention .  The patient's history has been reviewed, patient examined, no change in status, stable for surgery.  I have reviewed the patient's chart and labs.  Questions were answered to the patient's satisfaction.   Cath Lab Visit (complete for each Cath Lab visit)  Clinical Evaluation Leading to the Procedure:   ACS: Yes.    Non-ACS:    Anginal Classification: CCS IV  Anti-ischemic medical therapy: Minimal Therapy (1 class of medications)  Non-Invasive Test Results: No non-invasive testing performed  Prior CABG: No previous CABG        Manuel Barrett [facility] 03/06/2014 5:00 PM

## 2014-03-06 NOTE — Progress Notes (Signed)
Subjective: No further CP.  No SOB  Objective: Vital signs in last 24 hours: Temp:  [97.6 F (36.4 C)-98.3 F (36.8 C)] 97.9 F (36.6 C) (11/17 0413) Pulse Rate:  [65-81] 65 (11/17 0413) Resp:  [15-23] 18 (11/17 0413) BP: (88-146)/(47-85) 98/61 mmHg (11/17 0413) SpO2:  [92 %-97 %] 93 % (11/17 0413) Weight:  [178 lb 3.2 oz (80.831 kg)-185 lb (83.915 kg)] 178 lb 3.2 oz (80.831 kg) (11/17 0413) Last BM Date: 03/05/14  Intake/Output from previous day: 11/16 0701 - 11/17 0700 In: 645.1 [P.O.:240; I.V.:405.1] Out: -  Intake/Output this shift:    Medications Current Facility-Administered Medications  Medication Dose Route Frequency Provider Last Rate Last Dose  . 0.9 %  sodium chloride infusion  250 mL Intravenous PRN Rhonda G Barrett, PA-C      . 0.9 %  sodium chloride infusion  1 mL/kg/hr Intravenous Continuous Evelene Croon Barrett, PA-C 83.9 mL/hr at 03/06/14 0348 1 mL/kg/hr at 03/06/14 0348  . acetaminophen (TYLENOL) tablet 650 mg  650 mg Oral Q4H PRN Evelene Croon Barrett, PA-C   650 mg at 03/05/14 1917  . ALPRAZolam (XANAX) tablet 0.25 mg  0.25 mg Oral BID PRN Lonn Georgia, PA-C      . [START ON 03/07/2014] aspirin tablet 325 mg  325 mg Oral Daily Rhonda G Barrett, PA-C      . atorvastatin (LIPITOR) tablet 40 mg  40 mg Oral q1800 Rhonda G Barrett, PA-C   40 mg at 03/05/14 2134  . heparin ADULT infusion 100 units/mL (25000 units/250 mL)  1,150 Units/hr Intravenous Continuous Brain Hilts, RPH 11.5 mL/hr at 03/05/14 1828 1,150 Units/hr at 03/05/14 1828  . metoprolol tartrate (LOPRESSOR) tablet 50 mg  50 mg Oral BID Evelene Croon Barrett, PA-C   50 mg at 03/05/14 1822  . nitroGLYCERIN (NITROSTAT) SL tablet 0.4 mg  0.4 mg Sublingual Q5 Min x 3 PRN Rhonda G Barrett, PA-C      . ondansetron (ZOFRAN) injection 4 mg  4 mg Intravenous Q6H PRN Rhonda G Barrett, PA-C      . pantoprazole (PROTONIX) EC tablet 40 mg  40 mg Oral Daily Rhonda G Barrett, PA-C   40 mg at 03/05/14 1821  . sodium  chloride 0.9 % injection 3 mL  3 mL Intravenous Q12H Rhonda G Barrett, PA-C   3 mL at 03/05/14 2144  . sodium chloride 0.9 % injection 3 mL  3 mL Intravenous PRN Rhonda G Barrett, PA-C      . zolpidem (AMBIEN) tablet 5 mg  5 mg Oral QHS PRN Lonn Georgia, PA-C        PE: General appearance: alert, cooperative and no distress Lungs: clear to auscultation bilaterally Heart: regular rate and rhythm, S1, S2 normal, no murmur, click, rub or gallop Extremities: No LEE Pulses: 2+ and symmetric Skin: WArm and dry Neurologic: Grossly normal  Lab Results:   Recent Labs  03/05/14 1325  WBC 7.0  HGB 17.0  HCT 49.9  PLT 332   BMET  Recent Labs  03/05/14 1325 03/06/14 0616  NA 138 137  K 4.3 3.9  CL 99 100  CO2 24 24  GLUCOSE 102* 118*  BUN 18 21  CREATININE 1.23 1.30  CALCIUM 9.7 9.0   PT/INR  Recent Labs  03/05/14 1907  LABPROT 14.8  INR 1.15   Cholesterol  Recent Labs  03/06/14 0616  CHOL 193   Lipid Panel     Component Value Date/Time   CHOL  193 03/06/2014 0616   TRIG 148 03/06/2014 0616   HDL 25* 03/06/2014 0616   CHOLHDL 7.7 03/06/2014 0616   VLDL 30 03/06/2014 0616   LDLCALC 138* 03/06/2014 0616   Cardiac Panel (last 3 results)  Recent Labs  03/05/14 1907 03/06/14 0026 03/06/14 0616  TROPONINI <0.30 <0.30 <0.30     Assessment/Plan  68 y.o. male with a history of PAD and non-obstructive CAD.   He had onset of substernal chest pain yesterday at about 10:00 am. Principal Problem:   Unstable angina pectoris Active Problems:   Unstable angina  Plan:  No further CP.  Ruled out for MI.  Left heart cath today at 1500hrs.   ASA, lopressor 50bid, statin, IV heparin and NTG.  BP soft.   SCr stable.   LOS: 1 day    HAGER, BRYAN PA-C 03/06/2014 7:43 AM  I have examined the patient and reviewed assessment and plan and discussed with patient.  Agree with above as stated.  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 Unstable  angina. Plan for cath.  COntinue heparin, IV NTG.  Sx controlled with the IV meds.  Higher risk for CAD given PAD.  Manuel Barrett.

## 2014-03-06 NOTE — Discharge Summary (Signed)
CARDIOLOGY DISCHARGE SUMMARY   Patient ID: Manuel Barrett MRN: [id number] DOB/AGE: 27-Oct-1945 68 y.o.  Admit date: 03/05/2014 Discharge date: 03/06/2014  PCP: [facility] Primary Cardiologist: Dr. Martinique  Primary Discharge Diagnosis: Unstable angina pectoris Secondary Discharge Diagnosis:    Unstable angina   Hypertension   Dyslipidemia  Procedures: cardiac catheterization, coronary arteriogram, left ventriculogram  Hospital Course: Manuel Barrett is a 68 y.o. male with a history of PAD and nonobstructive CAD by remote cath. He had recurrent exertional chest pain and came to the emergency room where he was admitted for further evaluation and treatment.  His cardiac enzymes were negative for MI. His symptoms responded to sublingual nitroglycerin. He was heparinized and remained pain-free overnight. He had been on aspirin and a beta blocker prior to admission and these were continued. He was started on a statin. Lipid profile as below and shows dyslipidemia with a low HDL and an elevated LDL.  His symptoms were concerning for unstable anginal pain so he was taken to the cath lab on 11/17. The cardiac catheterization results are below. He had no critical CAD and his EF was preserved.  Dr. Martinique evaluated all data and felt no further inpatient workup is indicated. He tolerated the cardiac catheterization well, with no complications. He is ambulating without chest pain or shortness of breath and considered stable for discharge, to follow up as an outpatient.  Labs:   Lab Results  Component Value Date   WBC 7.0 03/05/2014   HGB 17.0 03/05/2014   HCT 49.9 03/05/2014   MCV 90.6 03/05/2014   PLT 332 03/05/2014     Recent Labs Lab 03/06/14 0616  NA 137  K 3.9  CL 100  CO2 24  BUN 21  CREATININE 1.30  CALCIUM 9.0  PROT 6.7  BILITOT 0.4  ALKPHOS 89  ALT 18  AST 18  GLUCOSE 118*    Recent Labs  03/05/14 1907 03/06/14 0026 03/06/14 0616  TROPONINI <0.30  <0.30 <0.30   Lipid Panel     Component Value Date/Time   CHOL 193 03/06/2014 0616   TRIG 148 03/06/2014 0616   HDL 25* 03/06/2014 0616   CHOLHDL 7.7 03/06/2014 0616   VLDL 30 03/06/2014 0616   LDLCALC 138* 03/06/2014 0616    Recent Labs  03/05/14 1907  INR 1.15     Radiology: Dg Chest Port 1 View 03/05/2014   CLINICAL DATA:  Left-sided chest pain started about 4 hr ago  EXAM: PORTABLE CHEST - 1 VIEW  COMPARISON:  None.  FINDINGS: The heart size and mediastinal contours are within normal limits. Both lungs are clear. The visualized skeletal structures are unremarkable.  IMPRESSION: No active disease.   Electronically Signed   By: Kathreen Devoid   On: 03/05/2014 14:25   Cardiac Cath: 03/06/2014 Left mainstem: Normal Left anterior descending (LAD): The LAD is tortuous with a focal 50-60% stenosis in the mid vessel. The first diagonal is large with 20-30% narrowing. Left circumflex (LCx): 30% mid vessel disease Right coronary artery (RCA): The RCA has a 30% proximal stenosis. There is irregular plaque in the mid and distal vessel up to 20%.  Left ventriculography: Left ventricular systolic function is normal, LVEF is estimated at 55-65%, there is no significant mitral regurgitation  Final Conclusions:  1. Moderate nonobstructive CAD 2. Normal LV function Recommendations: Medical management with ASA, beta blocker, and statin. Patient may be discharged today post procedure if no complications.  EKG: 03/05/2014 Sinus rhythm, rate  72 1 mm of J-point elevation in V1-V3, does not meet STEMI criteria  FOLLOW UP PLANS AND APPOINTMENTS No Known Allergies   Medication List    TAKE these medications        acetaminophen 500 MG tablet  Commonly known as:  TYLENOL  Take 1,500 mg by mouth every 6 (six) hours as needed for mild pain or headache.     aspirin 325 MG tablet  Take 325 mg by mouth daily.     atorvastatin 40 MG tablet  Commonly known as:  LIPITOR  Take 1 tablet (40 mg  total) by mouth daily at 6 PM.     metoprolol 50 MG tablet  Commonly known as:  LOPRESSOR  Take 50 mg by mouth 2 (two) times daily.     nitroGLYCERIN 0.4 MG SL tablet  Commonly known as:  NITROSTAT  Place 0.4 mg under the tongue every 5 (five) minutes as needed for chest pain.     omeprazole 20 MG capsule  Commonly known as:  PRILOSEC  Take 20 mg by mouth daily.     triamterene-hydrochlorothiazide 37.5-25 MG per tablet  Commonly known as:  MAXZIDE-25  Take 1 tablet by mouth daily.        Discharge Instructions    Diet - low sodium heart healthy    Complete by:  As directed      Increase activity slowly    Complete by:  As directed           Follow-up Information    Follow up with [name], MD.   Specialty:  Cardiology   Why:  The office will call   Contact information:   [address] [place] [address] [phone]       BRING ALL MEDICATIONS WITH YOU TO FOLLOW UP APPOINTMENTS  Time spent with patient to include physician time: 38 min Signed: Rosaria Ferries, PA-C 03/06/2014, 6:53 PM Co-Sign MD

## 2014-03-06 NOTE — Progress Notes (Signed)
CARE MANAGEMENT NOTE 03/06/2014  Patient:  Manuel Barrett, Manuel Barrett   Account Number:  [id number]  Date Initiated:  03/06/2014  Documentation initiated by:  GRAVES-BIGELOW,Darrill Vreeland  Subjective/Objective Assessment:   Pt admitted for Cp. Hx CAD and PAD. Initiated on IV hep and nitro gtt. Plan for cardiac cath today.     Action/Plan:   CM to monitor for disposition needs.   Anticipated DC Date:  03/07/2014   Anticipated DC Plan:  [place]  CM consult      Choice offered to / List presented to:             Status of service:  In process, will continue to follow Medicare Important Message given?   (If response is "NO", the following Medicare IM given date fields will be blank) Date Medicare IM given:   Medicare IM given by:   Date Additional Medicare IM given:   Additional Medicare IM given by:    Discharge Disposition:    Per UR Regulation:  Reviewed for med. necessity/level of care/duration of stay  If discussed at [place] of Stay Meetings, dates discussed:    Comments:

## 2014-03-07 ENCOUNTER — Telehealth: Payer: Self-pay | Admitting: Cardiology

## 2014-03-07 NOTE — Telephone Encounter (Signed)
Pt need a note for work stating the actual date he wlll return. Please fax [phone] Att: Veryl Speak

## 2014-03-07 NOTE — Telephone Encounter (Signed)
Returned call to patient spoke to wife she stated husband needs a letter stating ok to return to work 03/13/14.Dr.Jordan out of office, will let Dr.Jordan know 03/09/14 and will fax letter.

## 2014-03-09 MED ORDER — NITROGLYCERIN 0.4 MG SL SUBL: 0.4000 mg | SUBLINGUAL_TABLET | SUBLINGUAL | Status: DC | PRN

## 2014-03-09 NOTE — Telephone Encounter (Signed)
Spoke to patient letter to return to work 03/13/14 faxed to Veryl Speak at fax # [phone].Patient requested needs ntg refill.Refill sent to pharmacy.

## 2014-03-28 ENCOUNTER — Encounter: Payer: Self-pay | Admitting: Cardiology

## 2014-03-28 ENCOUNTER — Ambulatory Visit (INDEPENDENT_AMBULATORY_CARE_PROVIDER_SITE_OTHER): Payer: Medicare Other | Admitting: Cardiology

## 2014-03-28 VITALS — BP 150/80 | HR 52 | Ht 70.0 in | Wt 184.1 lb

## 2014-03-28 DIAGNOSIS — I10 Essential (primary) hypertension: Secondary | ICD-10-CM

## 2014-03-28 DIAGNOSIS — E785 Hyperlipidemia, unspecified: Secondary | ICD-10-CM

## 2014-03-28 DIAGNOSIS — Z79899 Other long term (current) drug therapy: Secondary | ICD-10-CM

## 2014-03-28 DIAGNOSIS — I200 Unstable angina: Secondary | ICD-10-CM

## 2014-03-28 DIAGNOSIS — I2510 Atherosclerotic heart disease of native coronary artery without angina pectoris: Secondary | ICD-10-CM

## 2014-03-28 MED ORDER — METOPROLOL SUCCINATE ER 50 MG PO TB24: ORAL_TABLET | ORAL | Status: DC

## 2014-03-28 NOTE — Progress Notes (Signed)
03/28/2014   PCP: Pcp Not In System   Chief Complaint  Patient presents with  . [facility]    cath 11/17, no chest oain since Cath    Primary Cardiologist:Dr. P. Martinique   HPI:  68 y.o. male with a history of PAD and nonobstructive CAD by remote cath. He had recurrent exertional chest pain and came to the emergency room 03/05/14. No MI and cardiac cath with non obstructive disease.  He does have 50-60% in LAD.  He was started on Statins.  He is back today for followup  No further chest pain, he does have some finger numbness at times with cold and toe numbness frequently- asked him to follow up with PCP for this.    He has been seen at [place], but he may continue cardiac care here.  He is upset that the [place] is not giving him disability for [employer].  I am giving copies of his cardiac cath  And discharge summary to him.  We discussed importance of exercise and diet.  Other hx as below.  Cardiac cath: Left anterior descending (LAD): The LAD is tortuous with a focal 50-60% stenosis in the mid vessel. The first diagonal is large with 20-30% narrowing.  Left circumflex (LCx): 30% mid vessel disease  Right coronary artery (RCA): The RCA has a 30% proximal stenosis. There is irregular plaque in the mid and distal vessel up to 20%.   Left ventriculography: Left ventricular systolic function is normal, LVEF is estimated at 55-65%, there is no significant mitral regurgitation    No Known Allergies  Current Outpatient Prescriptions  Medication Sig Dispense Refill  . acetaminophen (TYLENOL) 500 MG tablet Take 1,500 mg by mouth every 6 (six) hours as needed for mild pain or headache.    Marland Kitchen aspirin 325 MG tablet Take 325 mg by mouth daily.    Marland Kitchen atorvastatin (LIPITOR) 40 MG tablet Take 1 tablet (40 mg total) by mouth daily at 6 PM. 30 tablet 11  . nitroGLYCERIN (NITROSTAT) 0.4 MG SL tablet Place 0.4 mg under the tongue every 5 (five) minutes as needed for chest pain.    .  nitroGLYCERIN (NITROSTAT) 0.4 MG SL tablet Place 1 tablet (0.4 mg total) under the tongue every 5 (five) minutes as needed for chest pain. 25 tablet 11  . omeprazole (PRILOSEC) 20 MG capsule Take 20 mg by mouth daily.    Marland Kitchen triamterene-hydrochlorothiazide (MAXZIDE-25) 37.5-25 MG per tablet Take 1 tablet by mouth daily.    . metoprolol succinate (TOPROL XL) 50 MG 24 hr tablet Take 75mg  (1.5 tablets) Daily 135 tablet 2   No current facility-administered medications for this visit.    Past Medical History  Diagnosis Date  . Hypertension   . GERD (gastroesophageal reflux disease)   . PAD (peripheral artery disease) 1994  . CAD in native artery     Past Surgical History  Procedure Laterality Date  . Femoral-popliteal bypass graft  1994    Dr. Amedeo Plenty  . 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 Back surgery    . Cardiac cath  03/14/14    non obstructive disease    LAG:TXMIWOE:HO colds or fevers,  weight up from hospital with clothes and boots on. Skin:no rashes or ulcers HEENT:no blurred vision, no congestion CV:see HPI PUL:see HPI GI:no diarrhea constipation or melena, no indigestion GU:no hematuria, no dysuria MS:no joint pain, no claudication, toe numbness at times Neuro:no  syncope, no lightheadedness Endo:no diabetes, no thyroid disease  Wt Readings from Last 3 Encounters:  03/28/14 184 lb 1.6 oz (83.507 kg)  03/06/14 178 lb 3.2 oz (80.831 kg)    PHYSICAL EXAM BP 150/80 mmHg  Pulse 52  Ht 5\' 10"  (1.778 m)  Wt 184 lb 1.6 oz (83.507 kg)  BMI 26.42 kg/m2 General:Pleasant affect, NAD Skin:Warm and dry, brisk capillary refill HEENT:normocephalic, sclera clear, mucus membranes moist Neck:supple, no JVD, no bruits  Heart:S1S2 RRR without murmur, gallup, rub or click Lungs:clear without rales, rhonchi, or wheezes QIO:NGEX, non tender, + BS, do not palpate liver spleen or masses Ext:no lower ext edema,  2+ radial pulses- cath site without hematoma  Neuro:alert  and oriented, MAE, follows commands, + facial symmetry  EKG:S Brady at 52 no acute changes and no changes from Hospital visit.   ASSESSMENT AND PLAN CAD in native artery Non obstructive disease.  Now on statin.  Will recheck CMP and Lipids in 6 weeks.  He will follow up with Dr. P. Martinique in 4 months. On his metoprolol he is only taking 50 mg once a day will change to toprol 75 mg daily.    Hypertension Mildly elevated. He is only taking BB once a day see above for med changes.  Hyperlipidemia LDL goal <70 Lipid Panel     Component Value Date/Time   CHOL 193 03/06/2014 0616   TRIG 148 03/06/2014 0616   HDL 25* 03/06/2014 0616   CHOLHDL 7.7 03/06/2014 0616   VLDL 30 03/06/2014 0616   LDLCALC 138* 03/06/2014 0616    Now on lipitor.

## 2014-03-28 NOTE — Assessment & Plan Note (Signed)
Non obstructive disease.  Now on statin.  Will recheck CMP and Lipids in 6 weeks.  He will follow up with Dr. P. Martinique in 4 months. On his metoprolol he is only taking 50 mg once a day will change to toprol 75 mg daily.

## 2014-03-28 NOTE — Assessment & Plan Note (Signed)
Mildly elevated. He is only taking BB once a day see above for med changes.

## 2014-03-28 NOTE — Assessment & Plan Note (Signed)
Lipid Panel     Component Value Date/Time   CHOL 193 03/06/2014 0616   TRIG 148 03/06/2014 0616   HDL 25* 03/06/2014 0616   CHOLHDL 7.7 03/06/2014 0616   VLDL 30 03/06/2014 0616   LDLCALC 138* 03/06/2014 0616    Now on lipitor.

## 2014-03-28 NOTE — Patient Instructions (Signed)
Your physician recommends that you return for lab work in: 6 weeks. This will be FASTING  Your physician wants you to follow-up in: 4 months with Dr.Jordan.  You will receive a reminder letter in the mail two months in advance. If you don't receive a letter, please call our office to schedule the follow-up appointment.

## 2014-03-29 ENCOUNTER — Encounter (HOSPITAL_COMMUNITY): Payer: Self-pay | Admitting: Cardiology

## 2014-05-08 DIAGNOSIS — E785 Hyperlipidemia, unspecified: Secondary | ICD-10-CM | POA: Diagnosis not present

## 2014-05-08 DIAGNOSIS — Z79899 Other long term (current) drug therapy: Secondary | ICD-10-CM | POA: Diagnosis not present

## 2014-05-08 LAB — LIPID PANEL
Cholesterol: 145 mg/dL (ref 0–200)
HDL: 27 mg/dL — ABNORMAL LOW (ref >39)
LDL Cholesterol: 87 mg/dL (ref 0–99)
Total CHOL/HDL Ratio: 5.4 Ratio
Triglycerides: 154 mg/dL — ABNORMAL HIGH (ref <150)
VLDL: 31 mg/dL (ref 0–40)

## 2014-05-08 LAB — COMPREHENSIVE METABOLIC PANEL
ALK PHOS: 92 U/L (ref 39–117)
ALT: 21 U/L (ref 0–53)
AST: 22 U/L (ref 0–37)
Albumin: 3.9 g/dL (ref 3.5–5.2)
BUN: 17 mg/dL (ref 6–23)
CO2: 26 mEq/L (ref 19–32)
CREATININE: 1.12 mg/dL (ref 0.50–1.35)
Calcium: 9.1 mg/dL (ref 8.4–10.5)
Chloride: 104 mEq/L (ref 96–112)
GLUCOSE: 104 mg/dL — AB (ref 70–99)
Potassium: 4.1 mEq/L (ref 3.5–5.3)
Sodium: 139 mEq/L (ref 135–145)
Total Bilirubin: 0.7 mg/dL (ref 0.2–1.2)
Total Protein: 6.8 g/dL (ref 6.0–8.3)

## 2014-06-11 ENCOUNTER — Emergency Department (HOSPITAL_COMMUNITY)
Admission: EM | Admit: 2014-06-11 | Discharge: 2014-06-11 | Disposition: A | Discharge status: Home / Self Care | Payer: Medicare Other | Attending: Emergency Medicine | Admitting: Emergency Medicine

## 2014-06-11 ENCOUNTER — Encounter (HOSPITAL_COMMUNITY): Payer: Self-pay | Admitting: Emergency Medicine

## 2014-06-11 DIAGNOSIS — K219 Gastro-esophageal reflux disease without esophagitis: Secondary | ICD-10-CM | POA: Insufficient documentation

## 2014-06-11 DIAGNOSIS — Z7982 Long term (current) use of aspirin: Secondary | ICD-10-CM | POA: Diagnosis not present

## 2014-06-11 DIAGNOSIS — Z79899 Other long term (current) drug therapy: Secondary | ICD-10-CM | POA: Insufficient documentation

## 2014-06-11 DIAGNOSIS — H6091 Unspecified otitis externa, right ear: Secondary | ICD-10-CM | POA: Insufficient documentation

## 2014-06-11 DIAGNOSIS — Z87891 Personal history of nicotine dependence: Secondary | ICD-10-CM | POA: Insufficient documentation

## 2014-06-11 DIAGNOSIS — Z9889 Other specified postprocedural states: Secondary | ICD-10-CM | POA: Diagnosis not present

## 2014-06-11 DIAGNOSIS — I10 Essential (primary) hypertension: Secondary | ICD-10-CM | POA: Diagnosis not present

## 2014-06-11 DIAGNOSIS — Z951 Presence of aortocoronary bypass graft: Secondary | ICD-10-CM | POA: Diagnosis not present

## 2014-06-11 DIAGNOSIS — H9201 Otalgia, right ear: Secondary | ICD-10-CM | POA: Diagnosis present

## 2014-06-11 MED ORDER — NAPROXEN 500 MG PO TABS: 500.0000 mg | ORAL_TABLET | Freq: Two times a day (BID) | ORAL | Status: DC

## 2014-06-11 MED ORDER — AMOXICILLIN-POT CLAVULANATE 875-125 MG PO TABS: 1.0000 | ORAL_TABLET | Freq: Two times a day (BID) | ORAL | Status: DC

## 2014-06-11 MED ORDER — CIPROFLOXACIN-DEXAMETHASONE 0.3-0.1 % OT SUSP: 4.0000 [drp] | Freq: Four times a day (QID) | OTIC | Status: DC

## 2014-06-11 NOTE — Discharge Instructions (Signed)
Please call your doctor for a followup appointment within 24-48 hours. When you talk to your doctor please let them know that you were seen in the emergency department and have them acquire all of your records so that they can discuss the findings with you and formulate a treatment plan to fully care for your new and ongoing problems. ° °

## 2014-06-11 NOTE — ED Provider Notes (Signed)
CSN: [id number]     Arrival date & time 06/11/14  0701 History  This chart was scribed for Johnna Acosta, MD by Stephania Fragmin, ED Scribe. This patient was seen in room APA18/APA18 and the patient's care was started at 7:30 AM.    Chief Complaint  Patient presents with  . Otalgia   Patient is a 69 y.o. male presenting with ear pain. The history is provided by the patient and the spouse. No language interpreter was used.  Otalgia Associated symptoms: no fever and no vomiting      HPI Comments: Manuel Barrett is a 69 y.o. male who presents to the Emergency Department complaining of constant, worsening swelling and throbbing, burning pain in his right ear; the ear swelling began 3 days ago. Per wife, patient started using harder ear plugs at work 5 days ago, which is when his ears started to hurt. Patient denies any known trauma or injury. He has a 60% blockage in his coronary arteries, but has not had stents placed. He denies fever, nausea, or vomiting.    Past Medical History  Diagnosis Date  . Hypertension   . GERD (gastroesophageal reflux disease)   . PAD (peripheral artery disease) 1994  . CAD in native artery    Past Surgical History  Procedure Laterality Date  . Femoral-popliteal bypass graft  1994    Dr. Amedeo Plenty  . 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 Back surgery    . Cardiac cath  03/14/14    non obstructive disease  . Left heart catheterization with coronary angiogram N/A 03/06/2014    Procedure: LEFT HEART CATHETERIZATION WITH CORONARY ANGIOGRAM;  Surgeon: Peter M Martinique, MD;  Location: [facility] CATH LAB;  Service: Cardiovascular;  Laterality: N/A;   Family History  Problem Relation Age of Onset  . Heart failure Father   . COPD Father    History  Substance Use Topics  . Smoking status: Former Smoker    Quit date: 04/20/1992  . Smokeless tobacco: Never Used  . Alcohol Use: No    Review of Systems  Constitutional: Negative for fever.  HENT: Positive for  ear pain.   Gastrointestinal: Negative for nausea and vomiting.      Allergies  Review of patient's allergies indicates no known allergies.  Home Medications   Prior to Admission medications   Medication Sig Start Date End Date Taking? Authorizing Provider  acetaminophen (TYLENOL) 500 MG tablet Take 1,500 mg by mouth every 6 (six) hours as needed for mild pain or headache.    Historical Provider, MD  amoxicillin-clavulanate (AUGMENTIN) 875-125 MG per tablet Take 1 tablet by mouth every 12 (twelve) hours. 06/11/14   Johnna Acosta, MD  aspirin 325 MG tablet Take 325 mg by mouth daily.    Historical Provider, MD  atorvastatin (LIPITOR) 40 MG tablet Take 1 tablet (40 mg total) by mouth daily at 6 PM. 03/06/14   Evelene Croon Barrett, PA-C  ciprofloxacin-dexamethasone (CIPRODEX) otic suspension Place 4 drops into the right ear every 6 (six) hours. 06/11/14   Johnna Acosta, MD  metoprolol succinate (TOPROL XL) 50 MG 24 hr tablet Take 75mg  (1.5 tablets) Daily 03/28/14   [name], NP  naproxen (NAPROSYN) 500 MG tablet Take 1 tablet (500 mg total) by mouth 2 (two) times daily with a meal. 06/11/14   Johnna Acosta, MD  nitroGLYCERIN (NITROSTAT) 0.4 MG SL tablet Place 0.4 mg under the tongue every 5 (five) minutes as  needed for chest pain.    Historical Provider, MD  nitroGLYCERIN (NITROSTAT) 0.4 MG SL tablet Place 1 tablet (0.4 mg total) under the tongue every 5 (five) minutes as needed for chest pain. 03/09/14   Peter M Martinique, MD  omeprazole (PRILOSEC) 20 MG capsule Take 20 mg by mouth daily.    Historical Provider, MD  triamterene-hydrochlorothiazide (MAXZIDE-25) 37.5-25 MG per tablet Take 1 tablet by mouth daily.    Historical Provider, MD   BP 129/62 mmHg  Pulse 60  Temp(Src) 97.8 F (36.6 C) (Oral)  Resp 16  Ht 5\' 10"  (1.778 m)  Wt 180 lb (81.647 kg)  BMI 25.83 kg/m2  SpO2 97% Physical Exam  Constitutional: He appears well-developed and well-nourished.  HENT:  Head: Normocephalic and  atraumatic.  Right Ear: Tympanic membrane normal.  Left Ear: Tympanic membrane normal.  Right tragus is swollen with some EAC swelling.  Left ear is normal. TMs were visualized and normal bilaterally.  Eyes: Conjunctivae are normal. Right eye exhibits no discharge. Left eye exhibits no discharge.  Pulmonary/Chest: Effort normal. No respiratory distress.  Neurological: He is alert. Coordination normal.  Skin: Skin is warm and dry. No rash noted. He is not diaphoretic. No erythema.  Psychiatric: He has a normal mood and affect.  Nursing note and vitals reviewed.   ED Course  Procedures (including critical care time)  DIAGNOSTIC STUDIES: Oxygen Saturation is 97% on room air, normal by my interpretation.    COORDINATION OF CARE: 7:30 AM - Discussed treatment plan with pt at bedside which includes antibiotic and anti-inflammatory medication and pt agreed to plan.   Labs Review Labs Reviewed - No data to display  Imaging Review No results found.    MDM   Final diagnoses:  Otitis externa of right ear   Well appearing, abx prescribed - no fever or signs of malignant otitis etc.    Meds given in ED:  Medications - No data to display  Discharge Medication List as of 06/11/2014  7:49 AM    START taking these medications   Details  amoxicillin-clavulanate (AUGMENTIN) 875-125 MG per tablet Take 1 tablet by mouth every 12 (twelve) hours., Starting 06/11/2014, Until Discontinued, Print    ciprofloxacin-dexamethasone (CIPRODEX) otic suspension Place 4 drops into the right ear every 6 (six) hours., Starting 06/11/2014, Until Discontinued, Print    naproxen (NAPROSYN) 500 MG tablet Take 1 tablet (500 mg total) by mouth 2 (two) times daily with a meal., Starting 06/11/2014, Until Discontinued, Print         I personally performed the services described in this documentation, which was scribed in my presence. The recorded information has been reviewed and is accurate.      Johnna Acosta, MD 06/11/14 [phone]

## 2014-06-11 NOTE — ED Notes (Signed)
Patient with no complaints at this time. Respirations even and unlabored. Skin warm/dry. Discharge instructions reviewed with patient at this time. Patient given opportunity to voice concerns/ask questions. Patient discharged at this time and left Emergency Department with steady gait.   

## 2014-06-11 NOTE — ED Notes (Signed)
Pt with pain to the R ear, onset Thurs night. Pt reports pain and edema.

## 2014-08-15 DIAGNOSIS — L02411 Cutaneous abscess of right axilla: Secondary | ICD-10-CM | POA: Diagnosis not present

## 2014-09-06 ENCOUNTER — Encounter: Payer: Self-pay | Admitting: Cardiology

## 2014-09-06 ENCOUNTER — Ambulatory Visit (INDEPENDENT_AMBULATORY_CARE_PROVIDER_SITE_OTHER): Payer: Medicare Other | Admitting: Cardiology

## 2014-09-06 VITALS — BP 140/80 | HR 54 | Ht 70.0 in | Wt 181.6 lb

## 2014-09-06 DIAGNOSIS — E785 Hyperlipidemia, unspecified: Secondary | ICD-10-CM

## 2014-09-06 DIAGNOSIS — I2510 Atherosclerotic heart disease of native coronary artery without angina pectoris: Secondary | ICD-10-CM

## 2014-09-06 DIAGNOSIS — I10 Essential (primary) hypertension: Secondary | ICD-10-CM | POA: Diagnosis not present

## 2014-09-06 NOTE — Patient Instructions (Signed)
Continue your current therapy  I will see you in one year   

## 2014-09-06 NOTE — Progress Notes (Signed)
09/06/2014   PCP: [facility] in [place].  Chief Complaint  Patient presents with  . 4 mo rov    patient reports DOE    Primary Cardiologist:Dr. P. Martinique   HPI:  69 y.o. male with a history of PAD and nonobstructive CAD by remote cath. Admitted with chest pain on 03/05/14. No MI and cardiac cath with non obstructive disease.  He does have 50-60% in LAD.  He was started on Statins.  He is back today for followup  No further chest pain or SOB. Stays active doing mowing and gardening. Also works as a Primary school teacher. Feels well. No problems with medication.    No Known Allergies  Current Outpatient Prescriptions  Medication Sig Dispense Refill  . acetaminophen (TYLENOL) 500 MG tablet Take 1,500 mg by mouth every 6 (six) hours as needed for mild pain or headache.    Marland Kitchen aspirin 325 MG tablet Take 325 mg by mouth daily.    Marland Kitchen atorvastatin (LIPITOR) 40 MG tablet Take 1 tablet (40 mg total) by mouth daily at 6 PM. 30 tablet 11  . metoprolol succinate (TOPROL XL) 50 MG 24 hr tablet Take 75mg  (1.5 tablets) Daily 135 tablet 2  . naproxen (NAPROSYN) 500 MG tablet Take 1 tablet (500 mg total) by mouth 2 (two) times daily with a meal. 30 tablet 0  . nitroGLYCERIN (NITROSTAT) 0.4 MG SL tablet Place 1 tablet (0.4 mg total) under the tongue every 5 (five) minutes as needed for chest pain. 25 tablet 11  . omeprazole (PRILOSEC) 20 MG capsule Take 20 mg by mouth daily.    Marland Kitchen triamterene-hydrochlorothiazide (MAXZIDE-25) 37.5-25 MG per tablet Take 1 tablet by mouth daily.     No current facility-administered medications for this visit.    Past Medical History  Diagnosis Date  . Hypertension   . GERD (gastroesophageal reflux disease)   . PAD (peripheral artery disease) 1994  . CAD in native artery     Past Surgical History  Procedure Laterality Date  . Femoral-popliteal bypass graft  1994    Dr. Amedeo Plenty  . 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 Back surgery      . Cardiac cath  03/14/14    non obstructive disease  . Left heart catheterization with coronary angiogram N/A 03/06/2014    Procedure: LEFT HEART CATHETERIZATION WITH CORONARY ANGIOGRAM;  Surgeon: Peter M Martinique, MD;  Location: [facility] CATH LAB;  Service: Cardiovascular;  Laterality: N/A;    EQA:STMHDQQ:IW colds or fevers,  weight up from hospital with clothes and boots on. Skin:no rashes or ulcers HEENT:no blurred vision, no congestion CV:see HPI PUL:see HPI GI:no diarrhea constipation or melena, no indigestion GU:no hematuria, no dysuria MS:no joint pain, no claudication, toe numbness at times Neuro:no syncope, no lightheadedness Endo:no diabetes, no thyroid disease  Wt Readings from Last 3 Encounters:  09/06/14 181 lb 9.6 oz (82.373 kg)  06/11/14 180 lb (81.647 kg)  03/28/14 184 lb 1.6 oz (83.507 kg)    PHYSICAL EXAM BP 140/80 mmHg  Pulse 54  Ht 5\' 10"  (1.778 m)  Wt 181 lb 9.6 oz (82.373 kg)  BMI 26.06 kg/m2 General:Pleasant affect, NAD Skin:Warm and dry, brisk capillary refill HEENT:normocephalic, sclera clear, mucus membranes moist Neck:supple, no JVD, no bruits  Heart:S1S2 RRR without murmur, gallup, rub or click Lungs:clear without rales, rhonchi, or wheezes LNL:GXQJ, non tender, + BS, do not palpate liver spleen or masses Ext:no lower ext edema,  2+ radial pulses- cath site without hematoma  Neuro:alert and oriented, MAE, follows commands, + facial symmetry  Laboratory data:  Lab Results  Component Value Date   WBC 7.0 03/05/2014   HGB 17.0 03/05/2014   HCT 49.9 03/05/2014   PLT 332 03/05/2014   GLUCOSE 104* 05/08/2014   CHOL 145 05/08/2014   TRIG 154* 05/08/2014   HDL 27* 05/08/2014   LDLCALC 87 05/08/2014   ALT 21 05/08/2014   AST 22 05/08/2014   NA 139 05/08/2014   K 4.1 05/08/2014   CL 104 05/08/2014   CREATININE 1.12 05/08/2014   BUN 17 05/08/2014   CO2 26 05/08/2014   TSH 1.040 03/05/2014   INR 1.15 03/05/2014   HGBA1C 6.4* 03/05/2014      ASSESSMENT AND PLAN 1. 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 Asymptomatic. Continue beta blocker, ASA, and statin. Stressed importance of dietary modification and aerobic activity. Follow up in one year.  2. HTN controlled. 3. Hypercholesterolemia. Well controlled on lipitor.

## 2014-09-24 DIAGNOSIS — L0291 Cutaneous abscess, unspecified: Secondary | ICD-10-CM | POA: Diagnosis not present

## 2014-10-15 ENCOUNTER — Other Ambulatory Visit: Payer: Self-pay

## 2015-01-09 ENCOUNTER — Encounter (HOSPITAL_COMMUNITY)
Admission: EM | Disposition: A | Discharge status: Home / Self Care | Payer: Self-pay | Source: Home / Self Care | Attending: Internal Medicine

## 2015-01-09 ENCOUNTER — Inpatient Hospital Stay (HOSPITAL_COMMUNITY): Payer: Medicare Other | Admitting: Anesthesiology

## 2015-01-09 ENCOUNTER — Inpatient Hospital Stay (HOSPITAL_COMMUNITY)
Admission: EM | Admit: 2015-01-09 | Discharge: 2015-01-12 | DRG: 513 | Disposition: A | Discharge status: Home / Self Care | Payer: Medicare Other | Attending: Internal Medicine | Admitting: Internal Medicine

## 2015-01-09 ENCOUNTER — Emergency Department (HOSPITAL_COMMUNITY): Payer: Medicare Other

## 2015-01-09 ENCOUNTER — Encounter (HOSPITAL_COMMUNITY): Payer: Self-pay | Admitting: *Deleted

## 2015-01-09 DIAGNOSIS — Z95820 Peripheral vascular angioplasty status with implants and grafts: Secondary | ICD-10-CM

## 2015-01-09 DIAGNOSIS — Z87891 Personal history of nicotine dependence: Secondary | ICD-10-CM

## 2015-01-09 DIAGNOSIS — Z7982 Long term (current) use of aspirin: Secondary | ICD-10-CM

## 2015-01-09 DIAGNOSIS — M7989 Other specified soft tissue disorders: Secondary | ICD-10-CM | POA: Diagnosis not present

## 2015-01-09 DIAGNOSIS — L03011 Cellulitis of right finger: Secondary | ICD-10-CM | POA: Diagnosis not present

## 2015-01-09 DIAGNOSIS — L03119 Cellulitis of unspecified part of limb: Secondary | ICD-10-CM

## 2015-01-09 DIAGNOSIS — A4901 Methicillin susceptible Staphylococcus aureus infection, unspecified site: Secondary | ICD-10-CM | POA: Diagnosis present

## 2015-01-09 DIAGNOSIS — I10 Essential (primary) hypertension: Secondary | ICD-10-CM | POA: Diagnosis not present

## 2015-01-09 DIAGNOSIS — I739 Peripheral vascular disease, unspecified: Secondary | ICD-10-CM | POA: Diagnosis present

## 2015-01-09 DIAGNOSIS — L02519 Cutaneous abscess of unspecified hand: Secondary | ICD-10-CM | POA: Diagnosis present

## 2015-01-09 DIAGNOSIS — L02511 Cutaneous abscess of right hand: Secondary | ICD-10-CM | POA: Diagnosis not present

## 2015-01-09 DIAGNOSIS — M65841 Other synovitis and tenosynovitis, right hand: Secondary | ICD-10-CM | POA: Diagnosis not present

## 2015-01-09 DIAGNOSIS — M65141 Other infective (teno)synovitis, right hand: Principal | ICD-10-CM | POA: Diagnosis present

## 2015-01-09 DIAGNOSIS — E785 Hyperlipidemia, unspecified: Secondary | ICD-10-CM | POA: Diagnosis not present

## 2015-01-09 DIAGNOSIS — B9562 Methicillin resistant Staphylococcus aureus infection as the cause of diseases classified elsewhere: Secondary | ICD-10-CM | POA: Diagnosis present

## 2015-01-09 DIAGNOSIS — Z79899 Other long term (current) drug therapy: Secondary | ICD-10-CM

## 2015-01-09 DIAGNOSIS — I2510 Atherosclerotic heart disease of native coronary artery without angina pectoris: Secondary | ICD-10-CM | POA: Diagnosis present

## 2015-01-09 DIAGNOSIS — K219 Gastro-esophageal reflux disease without esophagitis: Secondary | ICD-10-CM | POA: Diagnosis present

## 2015-01-09 DIAGNOSIS — L089 Local infection of the skin and subcutaneous tissue, unspecified: Secondary | ICD-10-CM | POA: Diagnosis not present

## 2015-01-09 DIAGNOSIS — M79644 Pain in right finger(s): Secondary | ICD-10-CM | POA: Diagnosis not present

## 2015-01-09 HISTORY — PX: I & D EXTREMITY: SHX5045

## 2015-01-09 LAB — SURGICAL PCR SCREEN
MRSA, PCR: POSITIVE — AB
Staphylococcus aureus: POSITIVE — AB

## 2015-01-09 LAB — BASIC METABOLIC PANEL
ANION GAP: 6 (ref 5–15)
BUN: 18 mg/dL (ref 6–20)
CALCIUM: 8.4 mg/dL — AB (ref 8.9–10.3)
CO2: 24 mmol/L (ref 22–32)
Chloride: 105 mmol/L (ref 101–111)
Creatinine, Ser: 1.05 mg/dL (ref 0.61–1.24)
GFR calc Af Amer: >60 mL/min (ref >60)
GFR calc non Af Amer: >60 mL/min (ref >60)
GLUCOSE: 125 mg/dL — AB (ref 65–99)
POTASSIUM: 3.7 mmol/L (ref 3.5–5.1)
Sodium: 135 mmol/L (ref 135–145)

## 2015-01-09 LAB — CBC WITH DIFFERENTIAL/PLATELET
Basophils Absolute: 0 10*3/uL (ref 0.0–0.1)
Basophils Relative: 0 %
EOS PCT: 2 %
Eosinophils Absolute: 0.2 10*3/uL (ref 0.0–0.7)
HEMATOCRIT: 43.2 % (ref 39.0–52.0)
Hemoglobin: 15.2 g/dL (ref 13.0–17.0)
LYMPHS ABS: 1.5 10*3/uL (ref 0.7–4.0)
LYMPHS PCT: 12 %
MCH: 30.8 pg (ref 26.0–34.0)
MCHC: 35.2 g/dL (ref 30.0–36.0)
MCV: 87.4 fL (ref 78.0–100.0)
MONO ABS: 1 10*3/uL (ref 0.1–1.0)
Monocytes Relative: 8 %
NEUTROS ABS: 10.3 10*3/uL — AB (ref 1.7–7.7)
Neutrophils Relative %: 78 %
PLATELETS: 204 10*3/uL (ref 150–400)
RBC: 4.94 MIL/uL (ref 4.22–5.81)
RDW: 13 % (ref 11.5–15.5)
WBC: 13.1 10*3/uL — ABNORMAL HIGH (ref 4.0–10.5)

## 2015-01-09 LAB — CBC
HEMATOCRIT: 44.2 % (ref 39.0–52.0)
HEMOGLOBIN: 15 g/dL (ref 13.0–17.0)
MCH: 29.8 pg (ref 26.0–34.0)
MCHC: 33.9 g/dL (ref 30.0–36.0)
MCV: 87.7 fL (ref 78.0–100.0)
Platelets: 213 10*3/uL (ref 150–400)
RBC: 5.04 MIL/uL (ref 4.22–5.81)
RDW: 13.2 % (ref 11.5–15.5)
WBC: 10.5 10*3/uL (ref 4.0–10.5)

## 2015-01-09 LAB — CREATININE, SERUM: Creatinine, Ser: 1.18 mg/dL (ref 0.61–1.24)

## 2015-01-09 SURGERY — IRRIGATION AND DEBRIDEMENT EXTREMITY
Anesthesia: General | Site: Hand | Laterality: Right

## 2015-01-09 MED ORDER — DIPHENHYDRAMINE HCL 25 MG PO CAPS: 25.0000 mg | ORAL_CAPSULE | Freq: Four times a day (QID) | ORAL | Status: DC | PRN

## 2015-01-09 MED ORDER — ATORVASTATIN CALCIUM 40 MG PO TABS
40.0000 mg | ORAL_TABLET | Freq: Every day | ORAL | Status: DC
Start: 2015-01-09 — End: 2015-01-12
  Administered 2015-01-09 – 2015-01-11 (×3): 40 mg via ORAL
  Filled 2015-01-09 (×3): qty 1

## 2015-01-09 MED ORDER — VITAMIN C 500 MG PO TABS
1000.0000 mg | ORAL_TABLET | Freq: Every day | ORAL | Status: DC
  Administered 2015-01-10 – 2015-01-12 (×3): 1000 mg via ORAL
  Filled 2015-01-09 (×3): qty 2

## 2015-01-09 MED ORDER — METHOCARBAMOL 500 MG PO TABS
500.0000 mg | ORAL_TABLET | Freq: Four times a day (QID) | ORAL | Status: DC | PRN
Start: 2015-01-09 — End: 2015-01-12

## 2015-01-09 MED ORDER — ACETAMINOPHEN 500 MG PO TABS: 500.0000 mg | ORAL_TABLET | Freq: Four times a day (QID) | ORAL | Status: DC | PRN

## 2015-01-09 MED ORDER — SENNA 8.6 MG PO TABS
1.0000 | ORAL_TABLET | Freq: Two times a day (BID) | ORAL | Status: DC
  Administered 2015-01-09 – 2015-01-12 (×6): 8.6 mg via ORAL
  Filled 2015-01-09 (×6): qty 1

## 2015-01-09 MED ORDER — VANCOMYCIN HCL IN DEXTROSE 1-5 GM/200ML-% IV SOLN
1000.0000 mg | Freq: Once | INTRAVENOUS | Status: AC
  Administered 2015-01-09: 1000 mg via INTRAVENOUS
  Filled 2015-01-09: qty 200

## 2015-01-09 MED ORDER — DOUBLE ANTIBIOTIC 500-10000 UNIT/GM EX OINT
TOPICAL_OINTMENT | Freq: Once | CUTANEOUS | Status: AC
  Administered 2015-01-09: 20:00:00 via TOPICAL
  Filled 2015-01-09: qty 1

## 2015-01-09 MED ORDER — ONDANSETRON HCL 4 MG PO TABS: 4.0000 mg | ORAL_TABLET | Freq: Four times a day (QID) | ORAL | Status: DC | PRN

## 2015-01-09 MED ORDER — SODIUM CHLORIDE 0.9 % IR SOLN
Status: DC | PRN
  Administered 2015-01-09 (×3): 1000 mL

## 2015-01-09 MED ORDER — FENTANYL CITRATE (PF) 100 MCG/2ML IJ SOLN: 25.0000 ug | INTRAMUSCULAR | Status: DC | PRN

## 2015-01-09 MED ORDER — DEXTROSE-NACL 5-0.9 % IV SOLN
INTRAVENOUS | Status: DC
  Administered 2015-01-09: 09:00:00 via INTRAVENOUS

## 2015-01-09 MED ORDER — CHLORHEXIDINE GLUCONATE CLOTH 2 % EX PADS
6.0000 | MEDICATED_PAD | Freq: Every day | CUTANEOUS | Status: DC
  Administered 2015-01-10 – 2015-01-11 (×2): 6 via TOPICAL

## 2015-01-09 MED ORDER — LACTATED RINGERS IV SOLN
INTRAVENOUS | Status: DC
  Administered 2015-01-09: 18:00:00 via INTRAVENOUS

## 2015-01-09 MED ORDER — MIDAZOLAM HCL 5 MG/5ML IJ SOLN
INTRAMUSCULAR | Status: DC | PRN
  Administered 2015-01-09: 2 mg via INTRAVENOUS

## 2015-01-09 MED ORDER — PHENYLEPHRINE 40 MCG/ML (10ML) SYRINGE FOR IV PUSH (FOR BLOOD PRESSURE SUPPORT)
PREFILLED_SYRINGE | INTRAVENOUS | Status: AC
  Filled 2015-01-09: qty 10

## 2015-01-09 MED ORDER — SODIUM CHLORIDE 0.9 % IV SOLN
3.0000 g | Freq: Three times a day (TID) | INTRAVENOUS | Status: DC
  Administered 2015-01-09 – 2015-01-12 (×8): 3 g via INTRAVENOUS
  Filled 2015-01-09 (×10): qty 3

## 2015-01-09 MED ORDER — METHOCARBAMOL 1000 MG/10ML IJ SOLN
500.0000 mg | Freq: Four times a day (QID) | INTRAVENOUS | Status: DC | PRN
  Filled 2015-01-09: qty 5

## 2015-01-09 MED ORDER — GLYCOPYRROLATE 0.2 MG/ML IJ SOLN
INTRAMUSCULAR | Status: AC
  Filled 2015-01-09: qty 3

## 2015-01-09 MED ORDER — MIDAZOLAM HCL 2 MG/2ML IJ SOLN
INTRAMUSCULAR | Status: AC
Start: 2015-01-09 — End: 2015-01-09
  Filled 2015-01-09: qty 4

## 2015-01-09 MED ORDER — PHENYLEPHRINE 40 MCG/ML (10ML) SYRINGE FOR IV PUSH (FOR BLOOD PRESSURE SUPPORT)
PREFILLED_SYRINGE | INTRAVENOUS | Status: AC
Start: 2015-01-09 — End: 2015-01-09
  Filled 2015-01-09: qty 10

## 2015-01-09 MED ORDER — ARTIFICIAL TEARS OP OINT
TOPICAL_OINTMENT | OPHTHALMIC | Status: AC
  Filled 2015-01-09: qty 3.5

## 2015-01-09 MED ORDER — PROPOFOL 10 MG/ML IV BOLUS
INTRAVENOUS | Status: DC | PRN
  Administered 2015-01-09: 150 mg via INTRAVENOUS

## 2015-01-09 MED ORDER — FENTANYL CITRATE (PF) 250 MCG/5ML IJ SOLN
INTRAMUSCULAR | Status: AC
  Filled 2015-01-09: qty 5

## 2015-01-09 MED ORDER — LIDOCAINE HCL (CARDIAC) 20 MG/ML IV SOLN
INTRAVENOUS | Status: DC | PRN
  Administered 2015-01-09: 70 mg via INTRAVENOUS

## 2015-01-09 MED ORDER — MAGNESIUM CITRATE PO SOLN: 1.0000 | Freq: Once | ORAL | Status: DC | PRN

## 2015-01-09 MED ORDER — OXYCODONE HCL 5 MG PO TABS: 5.0000 mg | ORAL_TABLET | ORAL | Status: DC | PRN

## 2015-01-09 MED ORDER — MORPHINE SULFATE (PF) 2 MG/ML IV SOLN: 2.0000 mg | INTRAVENOUS | Status: DC | PRN

## 2015-01-09 MED ORDER — POLYETHYLENE GLYCOL 3350 17 G PO PACK: 17.0000 g | PACK | Freq: Every day | ORAL | Status: DC | PRN

## 2015-01-09 MED ORDER — MORPHINE SULFATE (PF) 2 MG/ML IV SOLN
2.0000 mg | INTRAVENOUS | Status: DC | PRN
  Administered 2015-01-09: 2 mg via INTRAVENOUS
  Filled 2015-01-09: qty 1

## 2015-01-09 MED ORDER — SUCCINYLCHOLINE CHLORIDE 20 MG/ML IJ SOLN
INTRAMUSCULAR | Status: AC
  Filled 2015-01-09: qty 1

## 2015-01-09 MED ORDER — ONDANSETRON HCL 4 MG/2ML IJ SOLN
INTRAMUSCULAR | Status: AC
  Filled 2015-01-09: qty 2

## 2015-01-09 MED ORDER — PROPOFOL 10 MG/ML IV BOLUS
INTRAVENOUS | Status: AC
  Filled 2015-01-09: qty 20

## 2015-01-09 MED ORDER — HYDROCODONE-ACETAMINOPHEN 5-325 MG PO TABS
1.0000 | ORAL_TABLET | ORAL | Status: DC | PRN
  Administered 2015-01-09 (×2): 2 via ORAL
  Filled 2015-01-09 (×2): qty 2

## 2015-01-09 MED ORDER — PHENYLEPHRINE HCL 10 MG/ML IJ SOLN
INTRAMUSCULAR | Status: DC | PRN
  Administered 2015-01-09 (×2): 120 ug via INTRAVENOUS

## 2015-01-09 MED ORDER — SODIUM CHLORIDE 0.9 % IV SOLN
10.0000 mg | INTRAVENOUS | Status: DC | PRN
  Administered 2015-01-09: 10 ug/min via INTRAVENOUS

## 2015-01-09 MED ORDER — VANCOMYCIN HCL IN DEXTROSE 1-5 GM/200ML-% IV SOLN
1000.0000 mg | Freq: Two times a day (BID) | INTRAVENOUS | Status: DC
  Administered 2015-01-09 – 2015-01-12 (×8): 1000 mg via INTRAVENOUS
  Filled 2015-01-09 (×8): qty 200

## 2015-01-09 MED ORDER — SODIUM CHLORIDE 0.9 % IR SOLN
Status: DC | PRN
  Administered 2015-01-09: 1000 mL

## 2015-01-09 MED ORDER — ONDANSETRON HCL 4 MG/2ML IJ SOLN
4.0000 mg | Freq: Four times a day (QID) | INTRAMUSCULAR | Status: DC | PRN
  Administered 2015-01-09: 4 mg via INTRAVENOUS

## 2015-01-09 MED ORDER — FENTANYL CITRATE (PF) 100 MCG/2ML IJ SOLN
INTRAMUSCULAR | Status: DC | PRN
  Administered 2015-01-09: 50 ug via INTRAVENOUS

## 2015-01-09 MED ORDER — PROMETHAZINE HCL 25 MG RE SUPP: 12.5000 mg | Freq: Four times a day (QID) | RECTAL | Status: DC | PRN

## 2015-01-09 MED ORDER — FAMOTIDINE 20 MG PO TABS: 20.0000 mg | ORAL_TABLET | Freq: Two times a day (BID) | ORAL | Status: DC | PRN

## 2015-01-09 MED ORDER — POTASSIUM CHLORIDE IN NACL 20-0.45 MEQ/L-% IV SOLN
INTRAVENOUS | Status: DC
  Administered 2015-01-09 – 2015-01-10 (×2): via INTRAVENOUS
  Filled 2015-01-09 (×4): qty 1000

## 2015-01-09 MED ORDER — ONDANSETRON HCL 4 MG/2ML IJ SOLN: 4.0000 mg | Freq: Four times a day (QID) | INTRAMUSCULAR | Status: DC | PRN

## 2015-01-09 MED ORDER — METOPROLOL SUCCINATE ER 50 MG PO TB24
50.0000 mg | ORAL_TABLET | Freq: Every day | ORAL | Status: DC
  Administered 2015-01-09 – 2015-01-12 (×4): 50 mg via ORAL
  Filled 2015-01-09 (×4): qty 1

## 2015-01-09 MED ORDER — DOUBLE ANTIBIOTIC 500-10000 UNIT/GM EX OINT
TOPICAL_OINTMENT | CUTANEOUS | Status: AC
Start: 2015-01-09 — End: 2015-01-09
  Filled 2015-01-09: qty 1

## 2015-01-09 MED ORDER — DOCUSATE SODIUM 100 MG PO CAPS
100.0000 mg | ORAL_CAPSULE | Freq: Two times a day (BID) | ORAL | Status: DC
  Administered 2015-01-09: 100 mg via ORAL
  Filled 2015-01-09: qty 1

## 2015-01-09 MED ORDER — LIDOCAINE HCL (CARDIAC) 20 MG/ML IV SOLN
INTRAVENOUS | Status: AC
  Filled 2015-01-09: qty 10

## 2015-01-09 MED ORDER — BISACODYL 10 MG RE SUPP: 10.0000 mg | Freq: Every day | RECTAL | Status: DC | PRN

## 2015-01-09 MED ORDER — HEPARIN SODIUM (PORCINE) 5000 UNIT/ML IJ SOLN: 5000.0000 [IU] | Freq: Three times a day (TID) | INTRAMUSCULAR | Status: DC

## 2015-01-09 MED ORDER — DOCUSATE SODIUM 100 MG PO CAPS
100.0000 mg | ORAL_CAPSULE | Freq: Two times a day (BID) | ORAL | Status: DC
  Administered 2015-01-09 – 2015-01-12 (×6): 100 mg via ORAL
  Filled 2015-01-09 (×6): qty 1

## 2015-01-09 MED ORDER — EPHEDRINE SULFATE 50 MG/ML IJ SOLN
INTRAMUSCULAR | Status: DC | PRN
  Administered 2015-01-09: 10 mg via INTRAVENOUS

## 2015-01-09 MED ORDER — PANTOPRAZOLE SODIUM 40 MG PO TBEC
40.0000 mg | DELAYED_RELEASE_TABLET | Freq: Every day | ORAL | Status: DC
  Administered 2015-01-09 – 2015-01-12 (×4): 40 mg via ORAL
  Filled 2015-01-09 (×4): qty 1

## 2015-01-09 MED ORDER — OXYCODONE-ACETAMINOPHEN 5-325 MG PO TABS
1.0000 | ORAL_TABLET | Freq: Once | ORAL | Status: AC
  Administered 2015-01-09: 1 via ORAL
  Filled 2015-01-09: qty 1

## 2015-01-09 MED ORDER — ASPIRIN 325 MG PO TABS
325.0000 mg | ORAL_TABLET | Freq: Every day | ORAL | Status: DC
  Administered 2015-01-10 – 2015-01-12 (×3): 325 mg via ORAL
  Filled 2015-01-09 (×3): qty 1

## 2015-01-09 MED ORDER — PROMETHAZINE HCL 25 MG/ML IJ SOLN: 6.2500 mg | INTRAMUSCULAR | Status: DC | PRN

## 2015-01-09 MED ORDER — BACITRACIN ZINC 500 UNIT/GM EX OINT
TOPICAL_OINTMENT | CUTANEOUS | Status: AC
  Filled 2015-01-09: qty 28.35

## 2015-01-09 MED ORDER — NAPROXEN 250 MG PO TABS
500.0000 mg | ORAL_TABLET | Freq: Two times a day (BID) | ORAL | Status: DC
Start: 2015-01-09 — End: 2015-01-12
  Administered 2015-01-09 – 2015-01-12 (×7): 500 mg via ORAL
  Filled 2015-01-09 (×7): qty 2

## 2015-01-09 MED ORDER — MUPIROCIN 2 % EX OINT
1.0000 "application " | TOPICAL_OINTMENT | Freq: Two times a day (BID) | CUTANEOUS | Status: DC
  Administered 2015-01-09 – 2015-01-12 (×7): 1 via NASAL
  Filled 2015-01-09: qty 22

## 2015-01-09 SURGICAL SUPPLY — 45 items
BANDAGE ELASTIC 3 VELCRO ST LF (GAUZE/BANDAGES/DRESSINGS) ×2 IMPLANT
BANDAGE ELASTIC 4 VELCRO ST LF (GAUZE/BANDAGES/DRESSINGS) ×1 IMPLANT
BNDG CONFORM 2 STRL LF (GAUZE/BANDAGES/DRESSINGS) IMPLANT
BNDG GAUZE ELAST 4 BULKY (GAUZE/BANDAGES/DRESSINGS) ×5 IMPLANT
CORDS BIPOLAR (ELECTRODE) ×3 IMPLANT
COVER SURGICAL LIGHT HANDLE (MISCELLANEOUS) ×2 IMPLANT
CUFF TOURNIQUET SINGLE 18IN (TOURNIQUET CUFF) ×3 IMPLANT
CUFF TOURNIQUET SINGLE 24IN (TOURNIQUET CUFF) IMPLANT
DRSG ADAPTIC 3X8 NADH LF (GAUZE/BANDAGES/DRESSINGS) ×3 IMPLANT
GAUZE SPONGE 4X4 12PLY STRL (GAUZE/BANDAGES/DRESSINGS) ×3 IMPLANT
GAUZE XEROFORM 1X8 LF (GAUZE/BANDAGES/DRESSINGS) ×3 IMPLANT
GAUZE XEROFORM 5X9 LF (GAUZE/BANDAGES/DRESSINGS) ×2 IMPLANT
GLOVE BIOGEL M STRL SZ7.5 (GLOVE) ×1 IMPLANT
GLOVE SS BIOGEL STRL SZ 8 (GLOVE) ×1 IMPLANT
GLOVE SUPERSENSE BIOGEL SZ 8 (GLOVE) ×2
GLOVE SURG SS PI 6.5 STRL IVOR (GLOVE) ×2 IMPLANT
GOWN STRL REUS W/ TWL LRG LVL3 (GOWN DISPOSABLE) ×1 IMPLANT
GOWN STRL REUS W/ TWL XL LVL3 (GOWN DISPOSABLE) ×2 IMPLANT
GOWN STRL REUS W/TWL LRG LVL3 (GOWN DISPOSABLE) ×6
GOWN STRL REUS W/TWL XL LVL3 (GOWN DISPOSABLE) ×3
HANDPIECE INTERPULSE COAX TIP (DISPOSABLE)
KIT BASIN OR (CUSTOM PROCEDURE TRAY) ×3 IMPLANT
KIT ROOM TURNOVER OR (KITS) ×3 IMPLANT
LOOP VESSEL MAXI BLUE (MISCELLANEOUS) ×2 IMPLANT
MANIFOLD NEPTUNE II (INSTRUMENTS) ×3 IMPLANT
NDL HYPO 25GX1X1/2 BEV (NEEDLE) IMPLANT
NEEDLE HYPO 25GX1X1/2 BEV (NEEDLE) ×3 IMPLANT
NS IRRIG 1000ML POUR BTL (IV SOLUTION) ×3 IMPLANT
PACK ORTHO EXTREMITY (CUSTOM PROCEDURE TRAY) ×3 IMPLANT
PAD ARMBOARD 7.5X6 YLW CONV (MISCELLANEOUS) ×3 IMPLANT
PAD CAST 4YDX4 CTTN HI CHSV (CAST SUPPLIES) ×1 IMPLANT
PADDING CAST COTTON 4X4 STRL (CAST SUPPLIES)
SET HNDPC FAN SPRY TIP SCT (DISPOSABLE) IMPLANT
SPONGE GAUZE 4X4 12PLY STER LF (GAUZE/BANDAGES/DRESSINGS) ×2 IMPLANT
SPONGE LAP 4X18 X RAY DECT (DISPOSABLE) ×3 IMPLANT
SUT CHROMIC 4 0 PS 2 18 (SUTURE) ×2 IMPLANT
SWAB COLLECTION DEVICE MRSA (MISCELLANEOUS) ×2 IMPLANT
SYR CONTROL 10ML LL (SYRINGE) IMPLANT
TOWEL OR 17X24 6PK STRL BLUE (TOWEL DISPOSABLE) ×3 IMPLANT
TOWEL OR 17X26 10 PK STRL BLUE (TOWEL DISPOSABLE) ×3 IMPLANT
TUBE ANAEROBIC SPECIMEN COL (MISCELLANEOUS) ×2 IMPLANT
TUBE CONNECTING 12'X1/4 (SUCTIONS) ×1
TUBE CONNECTING 12X1/4 (SUCTIONS) ×2 IMPLANT
WATER STERILE IRR 1000ML POUR (IV SOLUTION) ×1 IMPLANT
YANKAUER SUCT BULB TIP NO VENT (SUCTIONS) ×3 IMPLANT

## 2015-01-09 NOTE — ED Provider Notes (Signed)
CSN: [id number]     Arrival date & time 01/09/15  0253 History   First MD Initiated Contact with Patient 01/09/15 0303     Chief Complaint  Patient presents with  . Cellulitis     (Consider location/radiation/quality/duration/timing/severity/associated sxs/prior Treatment) HPI  This is a 69 year old male with a history of hypertension and coronary artery disease who presents with right thumb redness and swelling. Patient reports that several days ago he noted a small ON his right thumb. He states that he popped the bump and got some purulent drainage.  Yesterday he had increasing redness and swelling. At that time his wife sterilized a needle and popped a bump and got out "a lot of yellow and green stuff." Patient reports worsening pain and redness overnight. No fevers but does endorse chills. He is right-handed.   Past Medical History  Diagnosis Date  . Hypertension   . GERD (gastroesophageal reflux disease)   . PAD (peripheral artery disease) 1994  . CAD in native artery    Past Surgical History  Procedure Laterality Date  . Femoral-popliteal bypass graft  1994    Dr. Amedeo Plenty  . 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 Back surgery    . Cardiac cath  03/14/14    non obstructive disease  . Left heart catheterization with coronary angiogram N/A 03/06/2014    Procedure: LEFT HEART CATHETERIZATION WITH CORONARY ANGIOGRAM;  Surgeon: Peter M Martinique, MD;  Location: [facility] CATH LAB;  Service: Cardiovascular;  Laterality: N/A;   Family History  Problem Relation Age of Onset  . Heart failure Father   . COPD Father    Social History  Substance Use Topics  . Smoking status: Former Smoker    Quit date: 04/20/1992  . Smokeless tobacco: Never Used  . Alcohol Use: No    Review of Systems  Constitutional: Positive for chills. Negative for fever.  Respiratory: Negative.  Negative for chest tightness and shortness of breath.   Cardiovascular: Negative.  Negative for chest pain.   Gastrointestinal: Negative.   Genitourinary: Negative.   Skin: Positive for color change.  All other systems reviewed and are negative.     Allergies  Review of patient's allergies indicates no known allergies.  Home Medications   Prior to Admission medications   Medication Sig Start Date End Date Taking? Authorizing Provider  acetaminophen (TYLENOL) 500 MG tablet Take 1,500 mg by mouth every 6 (six) hours as needed for mild pain or headache.    Historical Provider, MD  aspirin 325 MG tablet Take 325 mg by mouth daily.    Historical Provider, MD  atorvastatin (LIPITOR) 40 MG tablet Take 1 tablet (40 mg total) by mouth daily at 6 PM. 03/06/14   Evelene Croon Barrett, PA-C  metoprolol succinate (TOPROL XL) 50 MG 24 hr tablet Take 75mg  (1.5 tablets) Daily 03/28/14   [name], NP  naproxen (NAPROSYN) 500 MG tablet Take 1 tablet (500 mg total) by mouth 2 (two) times daily with a meal. 06/11/14   [name], MD  nitroGLYCERIN (NITROSTAT) 0.4 MG SL tablet Place 1 tablet (0.4 mg total) under the tongue every 5 (five) minutes as needed for chest pain. 03/09/14   Peter M Martinique, MD  omeprazole (PRILOSEC) 20 MG capsule Take 20 mg by mouth daily.    Historical Provider, MD  triamterene-hydrochlorothiazide (MAXZIDE-25) 37.5-25 MG per tablet Take 1 tablet by mouth daily.    Historical Provider, MD   BP 145/68 mmHg  Pulse  75  Temp(Src) 98.4 F (36.9 C) (Oral)  Resp 18  Ht 5\' 10"  (1.778 m)  Wt 178 lb (80.74 kg)  BMI 25.54 kg/m2  SpO2 95% Physical Exam  Constitutional: He is oriented to person, place, and time. He appears well-developed and well-nourished. No distress.  HENT:  Head: Normocephalic and atraumatic.  Cardiovascular: Normal rate, regular rhythm and normal heart sounds.   No murmur heard. Pulmonary/Chest: Effort normal and breath sounds normal. No respiratory distress. He has no wheezes.  Musculoskeletal:  Focused examination of the right hand reveals swelling and erythema to  the proximal aspect of the right first digit, small pustule noted just proximal to the interphalangeal joint, limited range of motion at the interphalangeal joint, normal range of motion at the MCP joint, tense swelling of the digit mostly over the dorsum of the finger, no erythema extending proximally, 2+ radial pulse  Neurological: He is alert and oriented to person, place, and time.  Skin: Skin is warm and dry.  Psychiatric: He has a normal mood and affect.  Nursing note and vitals reviewed.   ED Course  Procedures (including critical care time) Labs Review Labs Reviewed  CBC WITH DIFFERENTIAL/PLATELET - Abnormal; Notable for the following:    WBC 13.1 (*)    Neutro Abs 10.3 (*)    All other components within normal limits  BASIC METABOLIC PANEL - Abnormal; Notable for the following:    Glucose, Bld 125 (*)    Calcium 8.4 (*)    All other components within normal limits  CULTURE, BLOOD (ROUTINE X 2)  CULTURE, BLOOD (ROUTINE X 2)    Imaging Review No results found. I have personally reviewed and evaluated these images and lab results as part of my medical decision-making.   EKG Interpretation None      MDM   Final diagnoses:  Cellulitis and abscess of hand    Patient presents with redness, swelling, and pain to the right first digit. Nontoxic on exam. Afebrile. Exam notable for tense swelling of the right palm, decreased range of motion at the interphalangeal joint, and overlying cellulitis. No spontaneous drainage noted. Basic lab work including CBC, BMP, and blood cultures obtained. Patient given IV vancomycin. X-ray pending. Patient has evidence of leukocytosis to 13.1 with a left shift.  Discussed the case with Dr. Amedeo Plenty.  Given evidence of cellulitis and leukocytosis, would favor admission with hand surgery consultation for evaluation for washout.  This will be discussed with Dr. Marin Comment. He will need transfer to [facility].    Merryl Hacker, MD 01/09/15 [phone]

## 2015-01-09 NOTE — Op Note (Signed)
See dictation #[id number] Amedeo Plenty MD

## 2015-01-09 NOTE — ED Notes (Signed)
Report given to Billy with [facility] 

## 2015-01-09 NOTE — Consult Note (Signed)
Reason for Consult: Infection right thumb Referring Physician: [facility] emergency [facility]  Manuel Barrett is an 69 y.o. male.  HPI: Patient presents with an infected thumb. This been going on for days. He has tried to pop it numerous times and has had discharge. At present time he presents with a festering infection about his thumb. I was called at 4 AM but unfortunately the patient did not arrive until 7 AM to the hospital for evaluation. He was transferred from [facility].  He denies neck back chest or abdomen pain  Patient is here with his wife.  He denies an obvious inciting event that he is aware of  He is alert and oriented. Chart is reviewed.  Past Medical History  Diagnosis Date  . Hypertension   . GERD (gastroesophageal reflux disease)   . PAD (peripheral artery disease) 1994  . CAD in native artery     Past Surgical History  Procedure Laterality Date  . Femoral-popliteal bypass graft  1994    Dr. Amedeo Plenty  . 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 Back surgery    . Cardiac cath  03/14/14    non obstructive disease  . Left heart catheterization with coronary angiogram N/A 03/06/2014    Procedure: LEFT HEART CATHETERIZATION WITH CORONARY ANGIOGRAM;  Surgeon: Peter M Martinique, MD;  Location: [facility] CATH LAB;  Service: Cardiovascular;  Laterality: N/A;    Family History  Problem Relation Age of Onset  . Heart failure Father   . COPD Father     Social History:  reports that he quit smoking about 22 years ago. He has never used smokeless tobacco. He reports that he does not drink alcohol or use illicit drugs.  Allergies: No Known Allergies  Medications: I have reviewed the patient's current medications.  Results for orders placed or performed during the hospital encounter of 01/09/15 (from the past 48 hour(s))  CBC with Differential     Status: Abnormal   Collection Time: 01/09/15  3:22 AM  Result Value Ref Range   WBC 13.1 (H) 4.0 - 10.5 K/uL   RBC 4.94  4.22 - 5.81 MIL/uL   Hemoglobin 15.2 13.0 - 17.0 g/dL   HCT 43.2 39.0 - 52.0 %   MCV 87.4 78.0 - 100.0 fL   MCH 30.8 26.0 - 34.0 pg   MCHC 35.2 30.0 - 36.0 g/dL   RDW 13.0 11.5 - 15.5 %   Platelets 204 150 - 400 K/uL   Neutrophils Relative % 78 %   Neutro Abs 10.3 (H) 1.7 - 7.7 K/uL   Lymphocytes Relative 12 %   Lymphs Abs 1.5 0.7 - 4.0 K/uL   Monocytes Relative 8 %   Monocytes Absolute 1.0 0.1 - 1.0 K/uL   Eosinophils Relative 2 %   Eosinophils Absolute 0.2 0.0 - 0.7 K/uL   Basophils Relative 0 %   Basophils Absolute 0.0 0.0 - 0.1 K/uL  Basic metabolic panel     Status: Abnormal   Collection Time: 01/09/15  3:22 AM  Result Value Ref Range   Sodium 135 135 - 145 mmol/L   Potassium 3.7 3.5 - 5.1 mmol/L   Chloride 105 101 - 111 mmol/L   CO2 24 22 - 32 mmol/L   Glucose, Bld 125 (H) 65 - 99 mg/dL   BUN 18 6 - 20 mg/dL   Creatinine, Ser 1.05 0.61 - 1.24 mg/dL   Calcium 8.4 (L) 8.9 - 10.3 mg/dL   GFR calc non  Af Amer >60 >60 mL/min   GFR calc Af Amer >60 >60 mL/min    Comment: (NOTE) The eGFR has been calculated using the CKD EPI equation. This calculation has not been validated in all clinical situations. eGFR's persistently <60 mL/min signify possible Chronic Kidney Disease.    Anion gap 6 5 - 15  Culture, blood (routine x 2)     Status: None (Preliminary result)   Collection Time: 01/09/15  3:22 AM  Result Value Ref Range   Specimen Description BLOOD RIGHT ANTECUBITAL    Special Requests      BOTTLES DRAWN AEROBIC AND ANAEROBIC AEB 8CC ANA Delcambre   Culture PENDING    Report Status PENDING   Culture, blood (routine x 2)     Status: None (Preliminary result)   Collection Time: 01/09/15  3:37 AM  Result Value Ref Range   Specimen Description BLOOD LEFT ANTECUBITAL    Special Requests      BOTTLES DRAWN AEROBIC AND ANAEROBIC AEB 8CC ANA Chrisman   Culture PENDING    Report Status PENDING     Dg Finger Thumb Right  01/09/2015   CLINICAL DATA:  Redness and swelling. Pain in  the right thumb. Draining wound.  EXAM: RIGHT THUMB 2+V  COMPARISON:  None.  FINDINGS: Thumb soft tissue swelling correlating with the history. There is no soft tissue gas or opaque foreign body. No evidence of osseous infection.  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  IMPRESSION: No acute finding. No foreign body, soft tissue gas, or evidence of osseous infection.   Electronically Signed   By: Monte Fantasia M.D.   On: 01/09/2015 04:57    Review of Systems  Eyes: Negative.   Respiratory: Negative.   Cardiovascular: Negative.   Gastrointestinal: Negative.   Genitourinary: Negative.   Neurological: Negative.   Endo/Heme/Allergies: Negative.    Blood pressure 152/70, pulse 63, temperature 97.7 F (36.5 C), temperature source Oral, resp. rate 18, height _0  (1.778 m), weight 80.74 kg (178 lb), SpO2 98 %. Physical Exam Right thumb with ascending cellulitis lymphangitis and obvious fluctuance. This involves extensor apparatus and dorsal aspect of the thumb. Thenar space is soft hyperthenar space is soft  Mainexamination show generalized tenderness but no evidence of necrotizing fasciitis or compartment syndrome. He does have sensation and refill to the tip of his finger.  The patient is alert and oriented in no acute distress. The patient complains of pain in the affected upper extremity.  The patient is noted to have a normal HEENT exam. Lung fields show equal chest expansion and no shortness of breath. Abdomen exam is nontender without distention. Lower extremity examination does not show any fracture dislocation or blood clot symptoms. Pelvis is stable and the neck and back are stable and nontender. 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 We'll plan permission elevation IV antibiotics per hospitalist and I will taken to the operative theater  tonight for I and D  Patient understands risks and benefits  We are planning surgery for your upper extremity. The risk and benefits of surgery to include risk of bleeding, infection, anesthesia,  damage to normal structures and failure of the surgery to accomplish its intended goals of relieving symptoms and restoring function have been discussed in detail. With this in mind we plan to proceed. I have specifically discussed with the patient the pre-and postoperative regime and the [facility]  and don'ts and risk and benefits in great detail. Risk and benefits of surgery also include risk of dystrophy(CRPS), chronic nerve pain, failure of the healing process to go onto completion and other inherent risks of surgery The relavent the pathophysiology of the disease/injury process, as well as the alternatives for treatment and postoperative course of action has been discussed in great detail with the patient who desires to proceed.  We will do everything in our power to help you (the patient) restore function to the upper extremity. It is a pleasure to see this patient today.   GRAMIG III,WILLIAM M 01/09/2015, 7:09 AM

## 2015-01-09 NOTE — Progress Notes (Addendum)
ANTIBIOTIC CONSULT NOTE - INITIAL  Pharmacy Consult for Vancomycin Indication: R thumb cellulitis  No Known Allergies  Patient Measurements: Height: 5\' 10"  (177.8 cm) Weight: 178 lb (80.74 kg) IBW/kg (Calculated) : 73 Vital Signs: Temp: 98.4 F (36.9 C) (09/21 0302) Temp Source: Oral (09/21 0302) BP: 125/58 mmHg (09/21 0600) Pulse Rate: 64 (09/21 0600) Intake/Output from previous day:   Intake/Output from this shift:    Labs:  Recent Labs  01/09/15 0322  WBC 13.1*  HGB 15.2  PLT 204  CREATININE 1.05   Estimated Creatinine Clearance: 71.5 mL/min (by C-G formula based on Cr of 1.05). No results for input(s): VANCOTROUGH, VANCOPEAK, VANCORANDOM, GENTTROUGH, GENTPEAK, GENTRANDOM, TOBRATROUGH, TOBRAPEAK, TOBRARND, AMIKACINPEAK, AMIKACINTROU, AMIKACIN in the last 72 hours.   Microbiology: Recent Results (from the past 720 hour(s))  Culture, blood (routine x 2)     Status: None (Preliminary result)   Collection Time: 01/09/15  3:22 AM  Result Value Ref Range Status   Specimen Description BLOOD RIGHT ANTECUBITAL  Final   Special Requests   Final    BOTTLES DRAWN AEROBIC AND ANAEROBIC AEB 8CC ANA [place]   Culture PENDING  Incomplete   Report Status PENDING  Incomplete  Culture, blood (routine x 2)     Status: None (Preliminary result)   Collection Time: 01/09/15  3:37 AM  Result Value Ref Range Status   Specimen Description BLOOD LEFT ANTECUBITAL  Final   Special Requests   Final    BOTTLES DRAWN AEROBIC AND ANAEROBIC AEB 8CC ANA 6CC   Culture PENDING  Incomplete   Report Status PENDING  Incomplete    Medical History: Past Medical History  Diagnosis Date  . Hypertension   . GERD (gastroesophageal reflux disease)   . PAD (peripheral artery disease) 1994  . CAD in native artery     Medications:  Awaiting med rec  Assessment: 69 y.o. male presents with swelling and pain of R thumb. Vancomycin 1gm IV given in ED ~0330. To continue Vancomycin for R thumb  cellulitis/abscess. WBC elevated to 13.3. Afeb. Estimated CrCl 70 ml/min.  Goal of Therapy:  Vancomycin trough level 10-15 mcg/ml  Plan:  Vancomycin 1gm IV q12h Will f/u renal function, micro data, and pt's clinical condition Vanc trough prn  Sherlon Handing, PharmD, BCPS Clinical pharmacist, pager [phone] 01/09/2015,6:57 AM     ======================================  Addendum: - add Unasyn for hand cellulitis with purulence, s/p surgery - appears that patient received vanc 1gm x 2 doses this afternoon (about 40 min apart)   Plan: - Unasyn 3gm IV Q8H - Monitor renal fxn, clinical progress - BMET in AM   Thuy D. Mina Marble, PharmD, BCPS Pager:  [phone] 01/09/2015, 8:34 PM

## 2015-01-09 NOTE — Op Note (Signed)
Manuel Barrett, CAFFREY NO.:  [id number]  MEDICAL RECORD NO.:  [id number]  LOCATION:  [id number]                        FACILITY:  [facility]  PHYSICIAN:  Satira Anis. Gramig, M.D.DATE OF BIRTH:  07/05/1945  DATE OF PROCEDURE:  01/09/2015 DATE OF DISCHARGE:                              OPERATIVE REPORT   PREOPERATIVE DIAGNOSIS:  Right thumb deep abscess with extensor tenosynovitis, infectious in nature.  POSTOPERATIVE DIAGNOSIS:  Right thumb deep abscess with extensor tenosynovitis, infectious in nature.  PROCEDURE: 1. Irrigation and debridement of deep abscess, this was a deep abscess     about the thumb and hand, cultured with aerobic and anaerobic     cultures. 2. Extensive extensor pollicis longus tenolysis, tenosynovectomy, and     decompression of infectious material. 3. Superficial radial nerve neurolysis, extensive in nature about the     ulnar aspect of the thumb.  SURGEON:  Satira Anis. Amedeo Plenty, M.D.  ASSISTANT:  None.  COMPLICATION:  None.  ANESTHESIA:  General.  TOURNIQUET TIME:  Less than an hour.  DRAINS:  Two.  CULTURES:  For aerobic and anaerobic taken.  INDICATIONS:  A 69 year old male referred from [facility] last night at 4 a.m.  He understands risks and benefits of surgery.  This had been a festering wound for quite sometime and has not improved.  He has obvious abscess and desires to proceed with surgical intervention.  I have discussed with him the risks and benefits, do's and don'ts, timeframe duration of recovery.  OPERATIVE PROCEDURE:  The patient was seen by myself and Anesthesia, taken to the operative suite, underwent smooth induction of general anesthesia.  Following this, he was prepped and draped in usual sterile fashion.  I pre-scrubbed him myself with Hibiclens, followed by Betadine scrub and paint.  Once this was complete, the patient then underwent time-out.  Pre and postop check list was observed.  Arm was  elevated, tourniquet was insufflated.  A curvilinear modified Brunner incision was made.  Once this was complete, the patient then underwent a decompression of an abscess.  He had a large amount of gross pus/purulence.  This was cultured for aerobic and anaerobic cultures.  Following evacuation, we then performed a radical tenolysis, tenosynovectomy of the EPL tendon that did have infectious sheath debris.  This underwent a tenosynovectomy, extensive in nature.  During the course of this, he had devitalized necrotic fat encased around the superficial radial nerve, and this was very carefully teased away the nerve, underwent distinct neurolysis under 4.0 loupe magnification with facial nerve dissector.  Following neurolysis and extensor sheath decompression and tenosynovectomy, I then placed 3 L of fluid in the wound.  Following this, I closed him very loosely with two 4-0 chromic sutures, and I placed vessel loop drains.  Refill was excellent.  The refill was established while we were irrigating and all looked well.  I was pleased with these findings.  We will look for reaccumulation, continue IV antibiotics, and continue postop algorithm.  He was dressed sterilely with Adaptic, Xeroform gauze, Kerlix, and an Ace wrap.  We will see him tomorrow, watch him closely and see how the wound declares itself.  I  would not be surprised if he has some devitalized skin tissue which appears.  Coverage could be an issue, we just have to wait and see given the time frame duration that he has had this infectious issue.  These notes have been discussed and all questions have been encouraged and answered.     Satira Anis. Amedeo Plenty, M.D.     [facility]  D:  01/09/2015  T:  01/09/2015  Job:  [id number]

## 2015-01-09 NOTE — Anesthesia Procedure Notes (Signed)
Procedure Name: LMA Insertion Date/Time: 01/09/2015 6:22 PM Performed by: Rush Farmer E Pre-anesthesia Checklist: Patient identified, Emergency Drugs available, Suction available, Patient being monitored and Timeout performed Patient Re-evaluated:Patient Re-evaluated prior to inductionOxygen Delivery Method: Circle system utilized Preoxygenation: Pre-oxygenation with 100% oxygen Intubation Type: IV induction LMA: LMA inserted LMA Size: 4.0 Number of attempts: 1 Placement Confirmation: positive ETCO2 and breath sounds checked- equal and bilateral Tube secured with: Tape Dental Injury: Teeth and Oropharynx as per pre-operative assessment

## 2015-01-09 NOTE — ED Notes (Signed)
Pt c/o redness, pain and swelling to right thumb; pt states it started out as a small bump x 4 days ago and he popped the bump, yesterday his wife cut it open and noted it to have green drainage coming from wound

## 2015-01-09 NOTE — Transfer of Care (Signed)
Immediate Anesthesia Transfer of Care Note  Patient: Manuel Barrett  Procedure(s) Performed: Procedure(s): IRRIGATION AND DEBRIDEMENT EXTREMITY (Right)  Patient Location: PACU  Anesthesia Type:General  Level of Consciousness: sedated  Airway & Oxygen Therapy: Patient Spontanous Breathing and Patient connected to nasal cannula oxygen  Post-op Assessment: Report given to RN and Post -op Vital signs reviewed and stable  Post vital signs: Reviewed and stable  Last Vitals:  Filed Vitals:   01/09/15 1300  BP: 110/61  Pulse: 52  Temp: 36.7 C  Resp: 18    Complications: No apparent anesthesia complications

## 2015-01-09 NOTE — Progress Notes (Signed)
[facility] PROGRESS NOTE   Manuel Barrett VZD:[id number] DOB: [birth year] DOA: 01/09/2015 PCP: Pcp Not In System  HPI/Subjective: Patient seen was his wife at bedside. Seen by Dr. Amedeo Plenty earlier today.  Assessment/Plan: Active Problems:   Hypertension   Hyperlipidemia LDL goal <70   Cellulitis and abscess of hand   Cellulitis   Patient seen and examined, data base reviewed. This is no charge note, patient seen earlier today by my colleague Dr. Truman Hayward. Right thumb cellulitis, started on antibiotics, to have I&D later today.  Code Status: Full Code Family Communication: Plan discussed with the patient. Disposition Plan: Remains inpatient Diet: Diet NPO time specified  Consultants:  Orthopedics  Procedures:  I&D scheduled for later today.  Antibiotics:  Vancomycin   Objective: Filed Vitals:   01/09/15 0659  BP: 152/70  Pulse: 63  Temp: 97.7 F (36.5 C)  Resp: 18   No intake or output data in the 24 hours ending 01/09/15 1247 Filed Weights   01/09/15 0302  Weight: 80.74 kg (178 lb)    Exam: General: Alert and awake, oriented x3, not in any acute distress. HEENT: anicteric sclera, pupils reactive to light and accommodation, EOMI CVS: S1-S2 clear, no murmur rubs or gallops Chest: clear to auscultation bilaterally, no wheezing, rales or rhonchi Abdomen: soft nontender, nondistended, normal bowel sounds, no organomegaly Extremities: no cyanosis, clubbing or edema noted bilaterally Neuro: Cranial nerves II-XII intact, no focal neurological deficits  Data Reviewed: Basic Metabolic Panel:  Recent Labs Lab 01/09/15 0322 01/09/15 0745  NA 135  --   K 3.7  --   CL 105  --   CO2 24  --   GLUCOSE 125*  --   BUN 18  --   CREATININE 1.05 1.18  CALCIUM 8.4*  --    Liver Function Tests: No results for input(s): AST, ALT, ALKPHOS, BILITOT, PROT, ALBUMIN in the last 168 hours. No results for input(s): LIPASE, AMYLASE in the last 168 hours. No results  for input(s): AMMONIA in the last 168 hours. CBC:  Recent Labs Lab 01/09/15 0322 01/09/15 0745  WBC 13.1* 10.5  NEUTROABS 10.3*  --   HGB 15.2 15.0  HCT 43.2 44.2  MCV 87.4 87.7  PLT 204 213   Cardiac Enzymes: No results for input(s): CKTOTAL, CKMB, CKMBINDEX, TROPONINI in the last 168 hours. BNP (last 3 results) No results for input(s): BNP in the last 8760 hours.  ProBNP (last 3 results) No results for input(s): PROBNP in the last 8760 hours.  CBG: No results for input(s): GLUCAP in the last 168 hours.  Micro Recent Results (from the past 240 hour(s))  Culture, blood (routine x 2)     Status: None (Preliminary result)   Collection Time: 01/09/15  3:22 AM  Result Value Ref Range Status   Specimen Description BLOOD RIGHT ANTECUBITAL  Final   Special Requests   Final    BOTTLES DRAWN AEROBIC AND ANAEROBIC AEB 8CC ANA [place]   Culture NO GROWTH < 12 HOURS  Final   Report Status PENDING  Incomplete  Culture, blood (routine x 2)     Status: None (Preliminary result)   Collection Time: 01/09/15  3:37 AM  Result Value Ref Range Status   Specimen Description BLOOD LEFT ANTECUBITAL  Final   Special Requests   Final    BOTTLES DRAWN AEROBIC AND ANAEROBIC AEB 8CC ANA 6CC   Culture NO GROWTH < 12 HOURS  Final   Report Status PENDING  Incomplete  Studies: Dg Finger Thumb Right  01/09/2015   CLINICAL DATA:  Redness and swelling. Pain in the right thumb. Draining wound.  EXAM: RIGHT THUMB 2+V  COMPARISON:  None.  FINDINGS: Thumb soft tissue swelling correlating with the history. There is no soft tissue gas or opaque foreign body. No evidence of osseous infection.  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  IMPRESSION: No acute finding. No foreign body, soft tissue gas, or evidence of osseous infection.   Electronically Signed   By: Monte Fantasia M.D.   On: 01/09/2015 04:57    Scheduled Meds: . aspirin  325 mg Oral Daily  .  atorvastatin  40 mg Oral q1800  . docusate sodium  100 mg Oral BID  . heparin  5,000 Units Subcutaneous 3 times per day  . metoprolol succinate  50 mg Oral Daily  . naproxen  500 mg Oral BID WC  . pantoprazole  40 mg Oral Daily  . vancomycin  1,000 mg Intravenous Q12H   Continuous Infusions: . dextrose 5 % and 0.9% NaCl 50 mL/hr at 01/09/15 0837       Time spent: 35 minutes    [facility]  [facility] Pager [phone] If 7PM-7AM, please contact night-coverage at [email or web address], password [facility] 01/09/2015, 12:47 PM  LOS: 0 days

## 2015-01-09 NOTE — Anesthesia Preprocedure Evaluation (Addendum)
Anesthesia Evaluation  Patient identified by MRN, date of birth, ID band Patient awake    Reviewed: Allergy & Precautions, NPO status , Patient's Chart, lab work & pertinent test results  Airway Mallampati: II  TM Distance: >3 FB Neck ROM: Full    Dental no notable dental hx.    Pulmonary neg pulmonary ROS, former smoker,    Pulmonary exam normal breath sounds clear to auscultation       Cardiovascular hypertension, Pt. on medications + Peripheral Vascular Disease  Normal cardiovascular exam Rhythm:Regular Rate:Normal     Neuro/Psych negative neurological ROS  negative psych ROS   GI/Hepatic negative GI ROS, Neg liver ROS,   Endo/Other  negative endocrine ROS  Renal/GU negative Renal ROS  negative genitourinary   Musculoskeletal negative musculoskeletal ROS (+)   Abdominal   Peds negative pediatric ROS (+)  Hematology negative hematology ROS (+)   Anesthesia Other Findings   Reproductive/Obstetrics negative OB ROS                            Anesthesia Physical Anesthesia Plan  ASA: III  Anesthesia Plan: General   Post-op Pain Management:    Induction: Intravenous  Airway Management Planned: Oral ETT and LMA  Additional Equipment:   Intra-op Plan:   Post-operative Plan: Extubation in OR  Informed Consent: I have reviewed the patients History and Physical, chart, labs and discussed the procedure including the risks, benefits and alternatives for the proposed anesthesia with the patient or authorized representative who has indicated his/her understanding and acceptance.   Dental advisory given  Plan Discussed with: CRNA and Surgeon  Anesthesia Plan Comments:         Anesthesia Quick Evaluation

## 2015-01-09 NOTE — OR Nursing (Signed)
1842: Dr Amedeo Plenty used 1/2 of a blue vessel loop as a drain for RIGHT thumb. Dressing applied by surgeon.

## 2015-01-09 NOTE — H&P (Signed)
[facility] History and Physical  SHARROD ACHILLE IOE:[id number] DOB: [birth year]    PCP:  [facility]   Chief Complaint: Swelling and pain of right thumb.   HPI: Manuel Barrett is an 69 y.o. male with hx of CAD and HTN, GERD, PAD, presented to the ER as his right thumb became more swollen, red, and tender after he expressed purulent discharge from a single while pustule a few days ago.  He has some subjective fever, but no chills.  He is UTD with his dT (five years ago).  Evaluation in the ER showed leukocytosis with WBC of 13K, normal renal Fx.  Xray of his thumb is pending.  EDP spoke with hand surgeon Dr Amedeo Plenty at [facility], and planned for transfer him to [facility] for antibiotics and hand surgery consultation.  He denied being DM, no regular alcohol use, and not a smoker.   Hospitalist was asked to admit him for same.   Rewiew of Systems:  Constitutional: Negative for malaise, fever and chills. No significant weight loss or weight gain Eyes: Negative for eye pain, redness and discharge, diplopia, visual changes, or flashes of light. ENMT: Negative for ear pain, hoarseness, nasal congestion, sinus pressure and sore throat. No headaches; tinnitus, drooling, or problem swallowing. Cardiovascular: Negative for chest pain, palpitations, diaphoresis, dyspnea and peripheral edema. ; No orthopnea, PND Respiratory: Negative for cough, hemoptysis, wheezing and stridor. No pleuritic chestpain. Gastrointestinal: Negative for nausea, vomiting, diarrhea, constipation, abdominal pain, melena, blood in stool, hematemesis, jaundice and rectal bleeding.    Genitourinary: Negative for frequency, dysuria, incontinence,flank pain and hematuria; Musculoskeletal: Negative for back pain and neck pain. Negative for swelling and trauma.;  Skin: . Negative for pruritus, rash, abrasions, bruising and skin lesion.; ulcerations Neuro: Negative for headache, lightheadedness and neck stiffness. Negative for weakness, altered level  of consciousness , altered mental status, extremity weakness, burning feet, involuntary movement, seizure and syncope.  Psych: negative for anxiety, depression, insomnia, tearfulness, panic attacks, hallucinations, paranoia, suicidal or homicidal ideation    Past Medical History  Diagnosis Date  . Hypertension   . GERD (gastroesophageal reflux disease)   . PAD (peripheral artery disease) 1994  . CAD in native artery     Past Surgical History  Procedure Laterality Date  . Femoral-popliteal bypass graft  1994    Dr. Amedeo Plenty  . 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 Back surgery    . Cardiac cath  03/14/14    non obstructive disease  . Left heart catheterization with coronary angiogram N/A 03/06/2014    Procedure: LEFT HEART CATHETERIZATION WITH CORONARY ANGIOGRAM;  Surgeon: Peter M Martinique, MD;  Location: [facility] CATH LAB;  Service: Cardiovascular;  Laterality: N/A;    Medications:  HOME MEDS: Prior to Admission medications   Medication Sig Start Date End Date Taking? Authorizing Provider  acetaminophen (TYLENOL) 500 MG tablet Take 1,500 mg by mouth every 6 (six) hours as needed for mild pain or headache.    Historical Provider, MD  aspirin 325 MG tablet Take 325 mg by mouth daily.    Historical Provider, MD  atorvastatin (LIPITOR) 40 MG tablet Take 1 tablet (40 mg total) by mouth daily at 6 PM. 03/06/14   Evelene Croon Barrett, PA-C  metoprolol succinate (TOPROL XL) 50 MG 24 hr tablet Take 75mg  (1.5 tablets) Daily 03/28/14   [name], NP  naproxen (NAPROSYN) 500 MG tablet Take 1 tablet (500 mg total) by mouth 2 (two) times daily with a  meal. 06/11/14   [name], MD  nitroGLYCERIN (NITROSTAT) 0.4 MG SL tablet Place 1 tablet (0.4 mg total) under the tongue every 5 (five) minutes as needed for chest pain. 03/09/14   Peter M Martinique, MD  omeprazole (PRILOSEC) 20 MG capsule Take 20 mg by mouth daily.    Historical Provider, MD  triamterene-hydrochlorothiazide (MAXZIDE-25)  37.5-25 MG per tablet Take 1 tablet by mouth daily.    Historical Provider, MD     Allergies:  No Known Allergies  Social History:   reports that he quit smoking about 22 years ago. He has never used smokeless tobacco. He reports that he does not drink alcohol or use illicit drugs.  Family History: Family History  Problem Relation Age of Onset  . Heart failure Father   . COPD Father      Physical Exam: Filed Vitals:   01/09/15 0302 01/09/15 0430  BP: 145/68 138/73  Pulse: 75   Temp: 98.4 F (36.9 C)   TempSrc: Oral   Resp: 18   Height: 5\' 10"  (1.778 m)   Weight: 80.74 kg (178 lb)   SpO2: 95%    Blood pressure 138/73, pulse 75, temperature 98.4 F (36.9 C), temperature source Oral, resp. rate 18, height 5\' 10"  (1.778 m), weight 80.74 kg (178 lb), SpO2 95 %.  GEN:  Pleasant  patient lying in the stretcher in no acute distress; cooperative with exam. PSYCH:  alert and oriented x4; does not appear anxious or depressed; affect is appropriate. HEENT: Mucous membranes pink and anicteric; PERRLA; EOM intact; no cervical lymphadenopathy nor thyromegaly or carotid bruit; no JVD; There were no stridor. Neck is very supple. Breasts:: Not examined CHEST WALL: No tenderness CHEST: Normal respiration, clear to auscultation bilaterally.  HEART: Regular rate and rhythm.  There are no murmur, rub, or gallops.   BACK: No kyphosis or scoliosis; no CVA tenderness ABDOMEN: soft and non-tender; no masses, no organomegaly, normal abdominal bowel sounds; no pannus; no intertriginous candida. There is no rebound and no distention. Rectal Exam: Not done EXTREMITIES: No bone or joint deformity; age-appropriate arthropathy of the hands and knees; no edema; no ulcerations.  There is no calf tenderness.  There is erythema and swelling over the dorsal aspect of the right thumb.   Genitalia: not examined PULSES: 2+ and symmetric SKIN: Normal hydration no rash or ulceration CNS: Cranial nerves 2-12  grossly intact no focal lateralizing neurologic deficit.  Speech is fluent; uvula elevated with phonation, facial symmetry and tongue midline. DTR are normal bilaterally, cerebella exam is intact, barbinski is negative and strengths are equaled bilaterally.  No sensory loss.   Labs on Admission:  Basic Metabolic Panel:  Recent Labs Lab 01/09/15 0322  NA 135  K 3.7  CL 105  CO2 24  GLUCOSE 125*  BUN 18  CREATININE 1.05  CALCIUM 8.4*   CBC:  Recent Labs Lab 01/09/15 0322  WBC 13.1*  NEUTROABS 10.3*  HGB 15.2  HCT 43.2  MCV 87.4  PLT 204    EKG: Independently reviewed.    Assessment/Plan Present on Admission:  . Cellulitis and abscess of hand . Hypertension . Hyperlipidemia LDL goal <70 . Cellulitis  PLAN:  Will continue with IV Vancomycin given in the ER, and admit him to [facility] under Dr Posey Pronto of [facility].  Hand surgery consultation with Dr Brigid Re was requested and he is aware.  Pain meds IV PRN, and continue with his cardiac meds.  NPO though I suspect he will not require surgery  today.  Give IVF.  He is stable and is a full code.  Thank you and Good day.   Other plans as per orders.  Code Status: FULL Haskel Khan, MD. [facility] Pager [phone] 7pm to 7am.  01/09/2015, 4:58 AM

## 2015-01-10 ENCOUNTER — Encounter (HOSPITAL_COMMUNITY): Payer: Self-pay | Admitting: Orthopedic Surgery

## 2015-01-10 DIAGNOSIS — L02511 Cutaneous abscess of right hand: Secondary | ICD-10-CM

## 2015-01-10 DIAGNOSIS — L02519 Cutaneous abscess of unspecified hand: Secondary | ICD-10-CM

## 2015-01-10 DIAGNOSIS — L03119 Cellulitis of unspecified part of limb: Secondary | ICD-10-CM

## 2015-01-10 LAB — BASIC METABOLIC PANEL
ANION GAP: 5 (ref 5–15)
BUN: 15 mg/dL (ref 6–20)
CHLORIDE: 107 mmol/L (ref 101–111)
CO2: 26 mmol/L (ref 22–32)
Calcium: 8.3 mg/dL — ABNORMAL LOW (ref 8.9–10.3)
Creatinine, Ser: 1.17 mg/dL (ref 0.61–1.24)
GFR calc non Af Amer: >60 mL/min (ref >60)
Glucose, Bld: 81 mg/dL (ref 65–99)
POTASSIUM: 4.2 mmol/L (ref 3.5–5.1)
SODIUM: 138 mmol/L (ref 135–145)

## 2015-01-10 LAB — VANCOMYCIN, TROUGH: VANCOMYCIN TR: 13 ug/mL (ref 10.0–20.0)

## 2015-01-10 MED ORDER — HYDROCODONE-ACETAMINOPHEN 5-325 MG PO TABS: 1.0000 | ORAL_TABLET | Freq: Four times a day (QID) | ORAL | Status: DC | PRN

## 2015-01-10 MED ORDER — DOXYCYCLINE HYCLATE 100 MG PO TABS: 100.0000 mg | ORAL_TABLET | Freq: Two times a day (BID) | ORAL | Status: DC

## 2015-01-10 MED ORDER — AMOXICILLIN-POT CLAVULANATE 875-125 MG PO TABS: 1.0000 | ORAL_TABLET | Freq: Two times a day (BID) | ORAL | Status: DC

## 2015-01-10 NOTE — Progress Notes (Signed)
[facility] PROGRESS NOTE   Manuel Barrett WCB:[id number] DOB: [birth year] DOA: 01/09/2015 PCP: Pcp Not In System  HPI/Subjective: Patient seen was his wife at bedside. Seen by Dr. Amedeo Plenty earlier today.  Assessment/Plan: Active Problems:   Hypertension   Hyperlipidemia LDL goal <70   Cellulitis and abscess of hand   Cellulitis   Abscess of hand, right   Cellulitis and abscess of the right thumb Patient presented initially to [facility], transferred to [facility] to see hand surgery. Patient seen by Dr. Amedeo Plenty and surgery was done with incision and drainage of an abscess on 9/21. Patient feels much better, dressing changes to be done by Dr. Amedeo Plenty later today. Continue IV vancomycin.  Hypertension Controlled, continue home medications.  Hyperlipidemia Continue home medications.  Code Status: Full Code Family Communication: Plan discussed with the patient. Disposition Plan: Remains inpatient Diet: Diet Heart Room service appropriate?: Yes; Fluid consistency:: Thin  Consultants:  Orthopedics  Procedures:  I&D done on 9/21.  Antibiotics:  Vancomycin   Objective: Filed Vitals:   01/10/15 1117  BP: 114/53  Pulse:   Temp:   Resp:     Intake/Output Summary (Last 24 hours) at 01/10/15 1409 Last data filed at 01/09/15 1900  Gross per 24 hour  Intake    700 ml  Output      5 ml  Net    695 ml   Filed Weights   01/09/15 0302  Weight: 80.74 kg (178 lb)    Exam: General: Alert and awake, oriented x3, not in any acute distress. HEENT: anicteric sclera, pupils reactive to light and accommodation, EOMI CVS: S1-S2 clear, no murmur rubs or gallops Chest: clear to auscultation bilaterally, no wheezing, rales or rhonchi Abdomen: soft nontender, nondistended, normal bowel sounds, no organomegaly Extremities: no cyanosis, clubbing or edema noted bilaterally Neuro: Cranial nerves II-XII intact, no focal neurological deficits  Data  Reviewed: Basic Metabolic Panel:  Recent Labs Lab 01/09/15 0322 01/09/15 0745 01/10/15 0405  NA 135  --  138  K 3.7  --  4.2  CL 105  --  107  CO2 24  --  26  GLUCOSE 125*  --  81  BUN 18  --  15  CREATININE 1.05 1.18 1.17  CALCIUM 8.4*  --  8.3*   Liver Function Tests: No results for input(s): AST, ALT, ALKPHOS, BILITOT, PROT, ALBUMIN in the last 168 hours. No results for input(s): LIPASE, AMYLASE in the last 168 hours. No results for input(s): AMMONIA in the last 168 hours. CBC:  Recent Labs Lab 01/09/15 0322 01/09/15 0745  WBC 13.1* 10.5  NEUTROABS 10.3*  --   HGB 15.2 15.0  HCT 43.2 44.2  MCV 87.4 87.7  PLT 204 213   Cardiac Enzymes: No results for input(s): CKTOTAL, CKMB, CKMBINDEX, TROPONINI in the last 168 hours. BNP (last 3 results) No results for input(s): BNP in the last 8760 hours.  ProBNP (last 3 results) No results for input(s): PROBNP in the last 8760 hours.  CBG: No results for input(s): GLUCAP in the last 168 hours.  Micro Recent Results (from the past 240 hour(s))  Culture, blood (routine x 2)     Status: None (Preliminary result)   Collection Time: 01/09/15  3:22 AM  Result Value Ref Range Status   Specimen Description BLOOD RIGHT ANTECUBITAL  Final   Special Requests   Final    BOTTLES DRAWN AEROBIC AND ANAEROBIC AEB 8CC ANA [place]   Culture NO GROWTH <  12 HOURS  Final   Report Status PENDING  Incomplete  Culture, blood (routine x 2)     Status: None (Preliminary result)   Collection Time: 01/09/15  3:37 AM  Result Value Ref Range Status   Specimen Description BLOOD LEFT ANTECUBITAL  Final   Special Requests   Final    BOTTLES DRAWN AEROBIC AND ANAEROBIC AEB 8CC ANA 6CC   Culture NO GROWTH < 12 HOURS  Final   Report Status PENDING  Incomplete  Surgical pcr screen     Status: Abnormal   Collection Time: 01/09/15 11:16 AM  Result Value Ref Range Status   MRSA, PCR POSITIVE (A) NEGATIVE Final   Staphylococcus aureus POSITIVE (A) NEGATIVE  Final    Comment:        The Xpert SA Assay (FDA approved for NASAL specimens in patients over 76 years of age), is one component of a comprehensive surveillance program.  Test performance has been validated by [facility] for patients greater than or equal to 61 year old. It is not intended to diagnose infection nor to guide or monitor treatment.   Anaerobic culture     Status: None (Preliminary result)   Collection Time: 01/09/15  7:19 PM  Result Value Ref Range Status   Specimen Description WOUND RIGHT HAND  Final   Special Requests NONE  Final   Gram Stain   Final    ABUNDANT WBC PRESENT, PREDOMINANTLY PMN NO SQUAMOUS EPITHELIAL CELLS SEEN MODERATE GRAM POSITIVE COCCI IN CLUSTERS Performed at [facility]    Culture   Final    NO ANAEROBES ISOLATED; CULTURE IN PROGRESS FOR 5 DAYS Performed at [facility]    Report Status PENDING  Incomplete  Wound culture     Status: None (Preliminary result)   Collection Time: 01/09/15  7:19 PM  Result Value Ref Range Status   Specimen Description WOUND RIGHT HAND  Final   Special Requests NONE  Final   Gram Stain   Final    ABUNDANT WBC PRESENT, PREDOMINANTLY PMN NO SQUAMOUS EPITHELIAL CELLS SEEN MODERATE GRAM POSITIVE COCCI IN CLUSTERS Performed at [facility]    Culture PENDING  Incomplete   Report Status PENDING  Incomplete     Studies: Dg Finger Thumb Right  01/09/2015   CLINICAL DATA:  Redness and swelling. Pain in the right thumb. Draining wound.  EXAM: RIGHT THUMB 2+V  COMPARISON:  None.  FINDINGS: Thumb soft tissue swelling correlating with the history. There is no soft tissue gas or opaque foreign body. No evidence of osseous infection.  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  IMPRESSION: No acute finding. No foreign body, soft tissue gas, or evidence of osseous infection.   Electronically Signed   By: Monte Fantasia M.D.   On: 01/09/2015  04:57    Scheduled Meds: . ampicillin-sulbactam (UNASYN) IV  3 g Intravenous Q8H  . aspirin  325 mg Oral Daily  . atorvastatin  40 mg Oral q1800  . Chlorhexidine Gluconate Cloth  6 each Topical Q0600  . docusate sodium  100 mg Oral BID  . metoprolol succinate  50 mg Oral Daily  . mupirocin ointment  1 application Nasal BID  . naproxen  500 mg Oral BID WC  . pantoprazole  40 mg Oral Daily  . senna  1 tablet Oral BID  . vancomycin  1,000 mg Intravenous Q12H  . vitamin C  1,000 mg Oral Daily   Continuous  Infusions: . 0.45 % NaCl with KCl 20 mEq / L 75 mL/hr at 01/09/15 2344  . dextrose 5 % and 0.9% NaCl 50 mL/hr at 01/09/15 0837  . lactated ringers         Time spent: 35 minutes    [facility]  [facility] Pager [phone] If 7PM-7AM, please contact night-coverage at [email or web address], password [facility] 01/10/2015, 2:09 PM  LOS: 1 day

## 2015-01-10 NOTE — Anesthesia Postprocedure Evaluation (Signed)
  Anesthesia Post-op Note  Patient: Manuel Barrett  Procedure(s) Performed: Procedure(s) (LRB): IRRIGATION AND DEBRIDEMENT RIGHT THUMB/HAND  (Right)  Patient Location: PACU  Anesthesia Type: General  Level of Consciousness: awake and alert   Airway and Oxygen Therapy: Patient Spontanous Breathing  Post-op Pain: mild  Post-op Assessment: Post-op Vital signs reviewed, Patient's Cardiovascular Status Stable, Respiratory Function Stable, Patent Airway and No signs of Nausea or vomiting  Last Vitals:  Filed Vitals:   01/09/15 2007  BP: 124/65  Pulse: 69  Temp: 36.4 C  Resp: 12    Post-op Vital Signs: stable   Complications: No apparent anesthesia complications

## 2015-01-10 NOTE — Progress Notes (Signed)
Patient ID: Manuel Barrett, male   DOB: 1945-09-23, 69 y.o.   MRN: [id number] Patient seen and examined at bedside  I removed his bandage in his drains following this I been discussed with him our next steps.  I performed irrigation and debridement skin success tissue and wet-to-dry dressing.  I will order PT hydrotherapy for [facility] and wet-to-dry dressing changes to be taut.  I discussed with patient we have to await culture results before he is discharged. This is a severe infection he needs IV antibiotics and final cultures and then transition to by mouth antibiotics before discharge.  I appreciate the help of the hospitalist-Dr Elmahi. I discussed his care at length with the medicine folks.  We have discussed with the patient the issues regarding their infection to the extremity. We will continue antibiotics and await culture results. Often times it will take 3-5 days for cultures to become final. During this time we will typically have the patient on intravenous antibiotics until we can find a parenteral route of antibiotic regime specific for the bacteria or organism isolated. We have discussed with the patient the need for daily irrigation and debridement as well as therapy to the area. We have discussed with the patient the necessity of range of motion to the involved joints as discussed today. We have discussed with the patient the unpredictability of infections at times. We'll continue to work towards good pain control and restoration of function. The patient understands the need for meticulous wound care and the necessity of proper followup.  The possible complications of stiffness (loss of motion), resistant infection, possible deep bone infection, possible chronic pain issues, possible need for multiple surgeries and even amputation.  With this in mind the patient understands our goal is to eradicate the infection to quiesence. We will continue to work towards these goals.  Gramig  MD

## 2015-01-10 NOTE — Progress Notes (Signed)
ANTIBIOTIC CONSULT NOTE - Follow up   Pharmacy Consult for Vancomycin Indication: R thumb cellulitis  No Known Allergies  Patient Measurements: Height: 5\' 10"  (177.8 cm) Weight: 178 lb (80.74 kg) IBW/kg (Calculated) : 73 Vital Signs: BP: 114/53 mmHg (09/22 1117) Pulse Rate: 70 (09/22 1100) Intake/Output from previous day: 09/21 0701 - 09/22 0700 In: 700 [I.V.:700] Out: 5 [Blood:5] Intake/Output from this shift:    Labs:  Recent Labs  01/09/15 0322 01/09/15 0745 01/10/15 0405  WBC 13.1* 10.5  --   HGB 15.2 15.0  --   PLT 204 213  --   CREATININE 1.05 1.18 1.17   Estimated Creatinine Clearance: 64.1 mL/min (by C-G formula based on Cr of 1.17).  Recent Labs  01/10/15 1619  [place] 13     Microbiology: Recent Results (from the past 720 hour(s))  Culture, blood (routine x 2)     Status: None (Preliminary result)   Collection Time: 01/09/15  3:22 AM  Result Value Ref Range Status   Specimen Description BLOOD RIGHT ANTECUBITAL  Final   Special Requests   Final    BOTTLES DRAWN AEROBIC AND ANAEROBIC AEB=8CC ANA=6CC   Culture NO GROWTH 1 DAY  Final   Report Status PENDING  Incomplete  Culture, blood (routine x 2)     Status: None (Preliminary result)   Collection Time: 01/09/15  3:37 AM  Result Value Ref Range Status   Specimen Description BLOOD LEFT ANTECUBITAL  Final   Special Requests   Final    BOTTLES DRAWN AEROBIC AND ANAEROBIC AEB=8CC ANA=6CC   Culture NO GROWTH 1 DAY  Final   Report Status PENDING  Incomplete  Surgical pcr screen     Status: Abnormal   Collection Time: 01/09/15 11:16 AM  Result Value Ref Range Status   MRSA, PCR POSITIVE (A) NEGATIVE Final   Staphylococcus aureus POSITIVE (A) NEGATIVE Final    Comment:        The Xpert SA Assay (FDA approved for NASAL specimens in patients over 44 years of age), is one component of a comprehensive surveillance program.  Test performance has been validated by [facility] for patients  greater than or equal to 94 year old. It is not intended to diagnose infection nor to guide or monitor treatment.   Anaerobic culture     Status: None (Preliminary result)   Collection Time: 01/09/15  7:19 PM  Result Value Ref Range Status   Specimen Description WOUND RIGHT HAND  Final   Special Requests NONE  Final   Gram Stain   Final    ABUNDANT WBC PRESENT, PREDOMINANTLY PMN NO SQUAMOUS EPITHELIAL CELLS SEEN MODERATE GRAM POSITIVE COCCI IN CLUSTERS Performed at [facility]    Culture   Final    NO ANAEROBES ISOLATED; CULTURE IN PROGRESS FOR 5 DAYS Performed at [facility]    Report Status PENDING  Incomplete  Wound culture     Status: None (Preliminary result)   Collection Time: 01/09/15  7:19 PM  Result Value Ref Range Status   Specimen Description WOUND RIGHT HAND  Final   Special Requests NONE  Final   Gram Stain   Final    ABUNDANT WBC PRESENT, PREDOMINANTLY PMN NO SQUAMOUS EPITHELIAL CELLS SEEN MODERATE GRAM POSITIVE COCCI IN CLUSTERS Performed at [facility]    Culture PENDING  Incomplete   Report Status PENDING  Incomplete    Medical History: Past Medical History  Diagnosis Date  . Hypertension   . GERD (  gastroesophageal reflux disease)   . PAD (peripheral artery disease) 1994  . CAD in native artery     Medications:  Awaiting med rec  Assessment: 69 y.o. male on Vancomycin 1gm IV q24h  for R thumb cellulitis/abscess. WBC 10.5 down from 13.3. Afebrile.  SCr = 1.17, Estimated CrCl 64 ml/min. Looks like patient received total of two 1gm vancomycin doses yesterday within 1 hour from each other.  Vancomycin trough level checked today = 13 mcg/ml. Level is therapeutic, though this is not a true steady state level since dosing is off.  If vancomycin continued ~ 5 days we should recheck trough at steady state.    Goal of Therapy:  Vancomycin trough level 10-15 mcg/ml  Plan:  Continue Vancomycin 1gm IV q12h Will f/u renal  function, micro data, and pt's clinical condition Vanc trough at steady state  Thank you for allowing pharmacy to be part of this patients care team.  Nicole Cella, RPh Clinical Pharmacist Pager: [phone] 01/10/2015,5:21 PM

## 2015-01-11 DIAGNOSIS — A4901 Methicillin susceptible Staphylococcus aureus infection, unspecified site: Secondary | ICD-10-CM | POA: Diagnosis present

## 2015-01-11 NOTE — Progress Notes (Addendum)
Patient ID: Manuel Barrett, male   DOB: 1946-02-09, 69 y.o.   MRN: [id number]  [facility] PROGRESS NOTE  CRU KRITIKOS XLK:[id number] DOB: [birth year] DOA: 01/09/2015 PCP: will need to set up pt with PCP    Brief narrative:    69 y.o. male with hx of CAD and HTN, GERD, PAD, presented to the ER as his right thumb became more swollen, red, and tender after he expressed purulent discharge from a single while pustule a few days PTA.  Assessment/Plan:    Right thumb deep abscess with extensor tenosynovitis, associated cellulitis, staph aureus  - s/p I&Dbyt Dr. Amedeo Plenty, post op day #2 - preliminary wound cultures with S. Aureus - per Dr. Amedeo Plenty, order PT hydrotherapy for [facility] and wet-to-dry dressing changes - awaiting wound cultures final report before pt can be discharge so we can narrow down ABX appropriately  - for now will continue Unasyn and Vancomycin day #3 - no indication for cardiac monitor, will discontinue   HTN - reasonable inpatient control  HLD - continue statin   DVT prophylaxis - SCD's  Code Status: Full.  Family Communication:  plan of care discussed with the patient Disposition Plan: Home when wound cultures final report is back   IV access:  Peripheral IV  Procedures and diagnostic studies:    Dg Finger Thumb Right 01/09/2015  No acute finding. No foreign body, soft tissue gas, or evidence of osseous infection.     Medical Consultants:  Dr. Amedeo Plenty - ortho   Other Consultants:  None  IAnti-Infectives:   Vancomycin 9/21 -->  Unasyn 9/21 -->   Faye Ramsay, MD  [facility] Pager [phone]  If 7PM-7AM, please contact night-coverage [email or web address] Password [facility] 01/11/2015, 12:45 PM   LOS: 2 days   HPI/Subjective: No events overnight.   Objective: Filed Vitals:   01/10/15 1100 01/10/15 1117 01/10/15 1900 01/11/15 0500  BP: 114/53 114/53 132/59 139/62  Pulse: 70  62 62  Temp:   98.1 F (36.7 C) 98.6 F (37 C)  TempSrc:   Oral Oral   Resp:   15 15  Height:      Weight:      SpO2:   96% 98%    Intake/Output Summary (Last 24 hours) at 01/11/15 1245 Last data filed at 01/11/15 4742  Gross per 24 hour  Intake 2413.75 ml  Output      0 ml  Net 2413.75 ml    Exam:   General:  Pt is alert, follows commands appropriately, not in acute distress  Cardiovascular: Regular rate and rhythm, S1/S2, no murmurs, no rubs, no gallops  Respiratory: Clear to auscultation bilaterally, no wheezing, no crackles, no rhonchi  Abdomen: Soft, non tender, non distended, bowel sounds present, no guarding  Extremities: right hand in dressing  Data Reviewed: Basic Metabolic Panel:  Recent Labs Lab 01/09/15 0322 01/09/15 0745 01/10/15 0405  NA 135  --  138  K 3.7  --  4.2  CL 105  --  107  CO2 24  --  26  GLUCOSE 125*  --  81  BUN 18  --  15  CREATININE 1.05 1.18 1.17  CALCIUM 8.4*  --  8.3*   CBC:  Recent Labs Lab 01/09/15 0322 01/09/15 0745  WBC 13.1* 10.5  NEUTROABS 10.3*  --   HGB 15.2 15.0  HCT 43.2 44.2  MCV 87.4 87.7  PLT 204 213    Recent Results (from the past 240 hour(s))  Culture, blood (routine x 2)  Status: None (Preliminary result)   Collection Time: 01/09/15  3:22 AM  Result Value Ref Range Status   Specimen Description BLOOD RIGHT ANTECUBITAL  Final   Special Requests   Final    BOTTLES DRAWN AEROBIC AND ANAEROBIC AEB=8CC ANA=6CC   Culture NO GROWTH 2 DAYS  Final   Report Status PENDING  Incomplete  Culture, blood (routine x 2)     Status: None (Preliminary result)   Collection Time: 01/09/15  3:37 AM  Result Value Ref Range Status   Specimen Description BLOOD LEFT ANTECUBITAL  Final   Special Requests   Final    BOTTLES DRAWN AEROBIC AND ANAEROBIC AEB=8CC ANA=6CC   Culture NO GROWTH 2 DAYS  Final   Report Status PENDING  Incomplete  Surgical pcr screen     Status: Abnormal   Collection Time: 01/09/15 11:16 AM  Result Value Ref Range Status   MRSA, PCR POSITIVE (A) NEGATIVE Final    Staphylococcus aureus POSITIVE (A) NEGATIVE Final    Comment:        The Xpert SA Assay (FDA approved for NASAL specimens in patients over 42 years of age), is one component of a comprehensive surveillance program.  Test performance has been validated by [facility] for patients greater than or equal to 54 year old. It is not intended to diagnose infection nor to guide or monitor treatment.   Anaerobic culture     Status: None (Preliminary result)   Collection Time: 01/09/15  7:19 PM  Result Value Ref Range Status   Specimen Description WOUND RIGHT HAND  Final   Special Requests NONE  Final   Gram Stain   Final    ABUNDANT WBC PRESENT, PREDOMINANTLY PMN NO SQUAMOUS EPITHELIAL CELLS SEEN MODERATE GRAM POSITIVE COCCI IN CLUSTERS Performed at [facility]    Culture   Final    NO ANAEROBES ISOLATED; CULTURE IN PROGRESS FOR 5 DAYS Performed at [facility]    Report Status PENDING  Incomplete  Wound culture     Status: None (Preliminary result)   Collection Time: 01/09/15  7:19 PM  Result Value Ref Range Status   Specimen Description WOUND RIGHT HAND  Final   Special Requests NONE  Final   Gram Stain   Final    ABUNDANT WBC PRESENT, PREDOMINANTLY PMN NO SQUAMOUS EPITHELIAL CELLS SEEN MODERATE GRAM POSITIVE COCCI IN CLUSTERS Performed at [facility]    Culture   Final    ABUNDANT STAPHYLOCOCCUS AUREUS Note: RIFAMPIN AND GENTAMICIN SHOULD NOT BE USED AS SINGLE DRUGS FOR TREATMENT OF STAPH INFECTIONS. Performed at [facility]    Report Status PENDING  Incomplete     Scheduled Meds: . ampicillin-sulbactam (UNASYN) IV  3 g Intravenous Q8H  . aspirin  325 mg Oral Daily  . atorvastatin  40 mg Oral q1800  . Chlorhexidine Gluconate Cloth  6 each Topical Q0600  . docusate sodium  100 mg Oral BID  . metoprolol succinate  50 mg Oral Daily  . mupirocin ointment  1 application Nasal BID  . naproxen  500 mg Oral BID WC  . pantoprazole  40 mg  Oral Daily  . senna  1 tablet Oral BID  . vancomycin  1,000 mg Intravenous Q12H  . vitamin C  1,000 mg Oral Daily   Continuous Infusions: . 0.45 % NaCl with KCl 20 mEq / L 75 mL/hr at 01/10/15 1636  . dextrose 5 % and 0.9% NaCl 50 mL/hr at 01/09/15 0837  .  lactated ringers

## 2015-01-12 LAB — CBC
HEMATOCRIT: 42.6 % (ref 39.0–52.0)
Hemoglobin: 14.3 g/dL (ref 13.0–17.0)
MCH: 29.7 pg (ref 26.0–34.0)
MCHC: 33.6 g/dL (ref 30.0–36.0)
MCV: 88.6 fL (ref 78.0–100.0)
Platelets: 239 10*3/uL (ref 150–400)
RBC: 4.81 MIL/uL (ref 4.22–5.81)
RDW: 12.8 % (ref 11.5–15.5)
WBC: 5.5 10*3/uL (ref 4.0–10.5)

## 2015-01-12 LAB — BASIC METABOLIC PANEL
ANION GAP: 8 (ref 5–15)
BUN: 16 mg/dL (ref 6–20)
CO2: 24 mmol/L (ref 22–32)
Calcium: 8.5 mg/dL — ABNORMAL LOW (ref 8.9–10.3)
Chloride: 107 mmol/L (ref 101–111)
Creatinine, Ser: 1.09 mg/dL (ref 0.61–1.24)
GFR calc Af Amer: >60 mL/min (ref >60)
GFR calc non Af Amer: >60 mL/min (ref >60)
GLUCOSE: 95 mg/dL (ref 65–99)
POTASSIUM: 3.9 mmol/L (ref 3.5–5.1)
Sodium: 139 mmol/L (ref 135–145)

## 2015-01-12 LAB — WOUND CULTURE

## 2015-01-12 LAB — VANCOMYCIN, TROUGH: Vancomycin Tr: 19 ug/mL (ref 10.0–20.0)

## 2015-01-12 MED ORDER — SULFAMETHOXAZOLE-TRIMETHOPRIM 800-160 MG PO TABS: 1.0000 | ORAL_TABLET | Freq: Two times a day (BID) | ORAL | Status: DC

## 2015-01-12 MED ORDER — VANCOMYCIN HCL IN DEXTROSE 1-5 GM/200ML-% IV SOLN
1000.0000 mg | Freq: Two times a day (BID) | INTRAVENOUS | Status: DC
  Filled 2015-01-12: qty 200

## 2015-01-12 NOTE — Progress Notes (Signed)
Instructed patient on specific dressing changes using sterile technique, per Dr Vanetta Shawl verbal orders.  Additional dressing supplies sent with patient for optimum technique.  Pt to have spouse perform dressing changes with follow up in [place] office on Monday September 26 @ 12 noon.

## 2015-01-12 NOTE — Progress Notes (Signed)
ANTIBIOTIC CONSULT NOTE - Follow up   Pharmacy Consult for Vancomycin Indication: R thumb cellulitis  Infectious Disease: Day #4 of abx for R thumb cellulitis / abscess. S/p I&D on 9/21. Afebrile, WBC down to wnl.  Awaiting wound cx to narrow abx and select PO option for discharge.  9/24 VT 19 on 1g q12 hours Vancomycin trough yesterday was 13 not at true steady state, today level was slightly above desired goal of 10-15. Will decrease dose and plan on rechecking level next week if abx are not changed to oral.  9/21 MRSA PCR>>positive  9/21 Blood cx > pending 9/21 wound cx >> MRSA   Vanc 9/21 >> Unasyn 9/21 >>9/24  Plan: Reduce vancomycin to 750mg  IV q12 hours   Labs:  Recent Labs  01/10/15 0405 01/12/15 0550  WBC  --  5.5  HGB  --  14.3  PLT  --  239  CREATININE 1.17 1.09   Estimated Creatinine Clearance: 68.8 mL/min (by C-G formula based on Cr of 1.09).  Recent Labs  01/10/15 1619 01/12/15 1450  VANCOTROUGH 13 19     Microbiology: Recent Results (from the past 720 hour(s))  Culture, blood (routine x 2)     Status: None (Preliminary result)   Collection Time: 01/09/15  3:22 AM  Result Value Ref Range Status   Specimen Description BLOOD RIGHT ANTECUBITAL  Final   Special Requests   Final    BOTTLES DRAWN AEROBIC AND ANAEROBIC AEB=8CC ANA=6CC   Culture NO GROWTH 3 DAYS  Final   Report Status PENDING  Incomplete  Culture, blood (routine x 2)     Status: None (Preliminary result)   Collection Time: 01/09/15  3:37 AM  Result Value Ref Range Status   Specimen Description BLOOD LEFT ANTECUBITAL  Final   Special Requests   Final    BOTTLES DRAWN AEROBIC AND ANAEROBIC AEB=8CC ANA=6CC   Culture NO GROWTH 3 DAYS  Final   Report Status PENDING  Incomplete  Surgical pcr screen     Status: Abnormal   Collection Time: 01/09/15 11:16 AM  Result Value Ref Range Status   MRSA, PCR POSITIVE (A) NEGATIVE Final   Staphylococcus aureus POSITIVE (A) NEGATIVE Final   Comment:        The Xpert SA Assay (FDA approved for NASAL specimens in patients over 23 years of age), is one component of a comprehensive surveillance program.  Test performance has been validated by [facility] for patients greater than or equal to 13 year old. It is not intended to diagnose infection nor to guide or monitor treatment.   Anaerobic culture     Status: None (Preliminary result)   Collection Time: 01/09/15  7:19 PM  Result Value Ref Range Status   Specimen Description WOUND RIGHT HAND  Final   Special Requests NONE  Final   Gram Stain   Final    ABUNDANT WBC PRESENT, PREDOMINANTLY PMN NO SQUAMOUS EPITHELIAL CELLS SEEN MODERATE GRAM POSITIVE COCCI IN CLUSTERS Performed at [facility]    Culture   Final    NO ANAEROBES ISOLATED; CULTURE IN PROGRESS FOR 5 DAYS Performed at [facility]    Report Status PENDING  Incomplete  Wound culture     Status: None   Collection Time: 01/09/15  7:19 PM  Result Value Ref Range Status   Specimen Description WOUND RIGHT HAND  Final   Special Requests NONE  Final   Gram Stain   Final    ABUNDANT WBC  PRESENT, PREDOMINANTLY PMN NO SQUAMOUS EPITHELIAL CELLS SEEN MODERATE GRAM POSITIVE COCCI IN CLUSTERS Performed at [facility]    Culture   Final    ABUNDANT METHICILLIN RESISTANT STAPHYLOCOCCUS AUREUS Note: RIFAMPIN AND GENTAMICIN SHOULD NOT BE USED AS SINGLE DRUGS FOR TREATMENT OF STAPH INFECTIONS. This organism DOES NOT demonstrate inducible Clindamycin resistance in vitro. CRITICAL RESULT CALLED TO, READ BACK BY AND VERIFIED WITH: ADRIENNE @0843  ON  01/12/15 BY KENDR Performed at [facility]    Report Status 01/12/2015 FINAL  Final   Organism ID, Bacteria METHICILLIN RESISTANT STAPHYLOCOCCUS AUREUS  Final      Susceptibility   Methicillin resistant staphylococcus aureus - MIC*    CLINDAMYCIN <=0.25 SENSITIVE Sensitive     ERYTHROMYCIN >=8 RESISTANT Resistant     GENTAMICIN <=0.5  SENSITIVE Sensitive     LEVOFLOXACIN 0.25 SENSITIVE Sensitive     OXACILLIN >=4 RESISTANT Resistant     RIFAMPIN <=0.5 SENSITIVE Sensitive     TRIMETH/SULFA <=10 SENSITIVE Sensitive     VANCOMYCIN 1 SENSITIVE Sensitive     TETRACYCLINE >=16 RESISTANT Resistant     * ABUNDANT METHICILLIN RESISTANT STAPHYLOCOCCUS AUREUS    Medical History: Past Medical History  Diagnosis Date  . Hypertension   . GERD (gastroesophageal reflux disease)   . PAD (peripheral artery disease) 1994  . CAD in native artery    Erin Hearing PharmD., BCPS Clinical Pharmacist Pager [phone] 01/12/2015 4:46 PM

## 2015-01-12 NOTE — Discharge Summary (Signed)
Physician Discharge Summary  Manuel Barrett [id number] DOB: [birth year] DOA: 01/09/2015  PCP: Pcp Not In System  Admit date: 01/09/2015 Discharge date: 01/12/2015  Time spent: greater than 30 minutes  Recommendations for Outpatient Follow-up:  1. F/u Dr. Amedeo Plenty next week 2. Keep arm elevated  Discharge Diagnoses:  Principal Problem:   Cellulitis and abscess of righthand, MRSA Active Problems:   Hypertension   Hyperlipidemia LDL goal <70  Discharge Condition: stable  Diet recommendation: heart healthy  Filed Weights   01/09/15 0302  Weight: 80.74 kg (178 lb)    History of present illness:  69 y.o. male with hx of CAD and HTN, GERD, PAD, presented to the ER as his right thumb became more swollen, red, and tender after he expressed purulent discharge from a single while pustule a few days ago. He has some subjective fever, but no chills. He is UTD with his dT (five years ago). Evaluation in the ER showed leukocytosis with WBC of 13K, normal renal Fx. Xray of his thumb is pending. EDP spoke with hand surgeon Dr Amedeo Plenty at [facility], and planned for transfer him to [facility] for antibiotics and hand surgery consultation. He denied being DM, no regular alcohol use, and not a smoker. Hospitalist was asked to admit him for same.   Hospital Course:  IV antibiotics continued. Taken to OR for I and D. Cultures grew MRSA resistant to tetracycline, sensitive to trimethoprim/sulfa.  Will discharge on 7 more days bactrim DS and close outpatient f/u with Dr. Amedeo Plenty.  Procedures: 1. Irrigation and debridement of deep abscess, this was a deep abscess  about the thumb and hand, cultured with aerobic and anaerobic  cultures. 2. Extensive extensor pollicis longus tenolysis, tenosynovectomy, and  decompression of infectious material. 3. Superficial radial nerve neurolysis, extensive in nature about the  ulnar aspect of the thumb.   Consultations: Hand surgery  Discharge Exam: Filed  Vitals:   01/12/15 1204  BP: 138/72  Pulse: 66  Temp: 97.7 F (36.5 C)  Resp: 18    General: comfortable, a and o Cardiovascular: RRR Respiratory: CTA Ext dressing CDI  Discharge Instructions   Discharge Instructions    Activity as tolerated - No restrictions    Complete by:  As directed      Leave dressing on - Keep it clean, dry, and intact until clinic visit    Complete by:  As directed           Current Discharge Medication List    START taking these medications   Details  HYDROcodone-acetaminophen (NORCO) 5-325 MG per tablet Take 1 tablet by mouth every 6 (six) hours as needed for severe pain. Qty: 20 tablet, Refills: 0    sulfamethoxazole-trimethoprim (BACTRIM DS,SEPTRA DS) 800-160 MG per tablet Take 1 tablet by mouth 2 (two) times daily. Until gone Qty: 14 tablet, Refills: 0      CONTINUE these medications which have NOT CHANGED   Details  aspirin 325 MG tablet Take 325 mg by mouth daily.    atorvastatin (LIPITOR) 40 MG tablet Take 1 tablet (40 mg total) by mouth daily at 6 PM. Qty: 30 tablet, Refills: 11    ibuprofen (ADVIL,MOTRIN) 200 MG tablet Take 400 mg by mouth every 6 (six) hours as needed for mild pain.    metoprolol succinate (TOPROL XL) 50 MG 24 hr tablet Take 75mg  (1.5 tablets) Daily Qty: 135 tablet, Refills: 2    omeprazole (PRILOSEC) 20 MG capsule Take 20 mg by mouth daily.  triamterene-hydrochlorothiazide (MAXZIDE-25) 37.5-25 MG per tablet Take 1 tablet by mouth daily.    naproxen (NAPROSYN) 500 MG tablet Take 1 tablet (500 mg total) by mouth 2 (two) times daily with a meal. Qty: 30 tablet, Refills: 0    nitroGLYCERIN (NITROSTAT) 0.4 MG SL tablet Place 1 tablet (0.4 mg total) under the tongue every 5 (five) minutes as needed for chest pain. Qty: 25 tablet, Refills: 11      STOP taking these medications     acetaminophen (TYLENOL) 500 MG tablet        No Known Allergies Follow-up Information    Follow up with Paulene Floor, MD.   Specialty:  Orthopedic Surgery   Contact information:   [address] [place] [address] [phone]        The results of significant diagnostics from this hospitalization (including imaging, microbiology, ancillary and laboratory) are listed below for reference.    Significant Diagnostic Studies: Dg Finger Thumb Right  01/09/2015   CLINICAL DATA:  Redness and swelling. Pain in the right thumb. Draining wound.  EXAM: RIGHT THUMB 2+V  COMPARISON:  None.  FINDINGS: Thumb soft tissue swelling correlating with the history. There is no soft tissue gas or opaque foreign body. No evidence of osseous infection.  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  IMPRESSION: No acute finding. No foreign body, soft tissue gas, or evidence of osseous infection.   Electronically Signed   By: Monte Fantasia M.D.   On: 01/09/2015 04:57    Microbiology: Recent Results (from the past 240 hour(s))  Culture, blood (routine x 2)     Status: None (Preliminary result)   Collection Time: 01/09/15  3:22 AM  Result Value Ref Range Status   Specimen Description BLOOD RIGHT ANTECUBITAL  Final   Special Requests   Final    BOTTLES DRAWN AEROBIC AND ANAEROBIC AEB=8CC ANA=6CC   Culture NO GROWTH 3 DAYS  Final   Report Status PENDING  Incomplete  Culture, blood (routine x 2)     Status: None (Preliminary result)   Collection Time: 01/09/15  3:37 AM  Result Value Ref Range Status   Specimen Description BLOOD LEFT ANTECUBITAL  Final   Special Requests   Final    BOTTLES DRAWN AEROBIC AND ANAEROBIC AEB=8CC ANA=6CC   Culture NO GROWTH 3 DAYS  Final   Report Status PENDING  Incomplete  Surgical pcr screen     Status: Abnormal   Collection Time: 01/09/15 11:16 AM  Result Value Ref Range Status   MRSA, PCR POSITIVE (A) NEGATIVE Final   Staphylococcus aureus POSITIVE (A) NEGATIVE Final    Comment:        The Xpert SA Assay  (FDA approved for NASAL specimens in patients over 16 years of age), is one component of a comprehensive surveillance program.  Test performance has been validated by [facility] for patients greater than or equal to 54 year old. It is not intended to diagnose infection nor to guide or monitor treatment.   Anaerobic culture     Status: None (Preliminary result)   Collection Time: 01/09/15  7:19 PM  Result Value Ref Range Status   Specimen Description WOUND RIGHT HAND  Final   Special Requests NONE  Final   Gram Stain   Final    ABUNDANT WBC PRESENT, PREDOMINANTLY PMN NO SQUAMOUS EPITHELIAL CELLS SEEN MODERATE GRAM POSITIVE COCCI IN CLUSTERS Performed at [facility]  Culture   Final    NO ANAEROBES ISOLATED; CULTURE IN PROGRESS FOR 5 DAYS Performed at [facility]    Report Status PENDING  Incomplete  Wound culture     Status: None   Collection Time: 01/09/15  7:19 PM  Result Value Ref Range Status   Specimen Description WOUND RIGHT HAND  Final   Special Requests NONE  Final   Gram Stain   Final    ABUNDANT WBC PRESENT, PREDOMINANTLY PMN NO SQUAMOUS EPITHELIAL CELLS SEEN MODERATE GRAM POSITIVE COCCI IN CLUSTERS Performed at [facility]    Culture   Final    ABUNDANT METHICILLIN RESISTANT STAPHYLOCOCCUS AUREUS Note: RIFAMPIN AND GENTAMICIN SHOULD NOT BE USED AS SINGLE DRUGS FOR TREATMENT OF STAPH INFECTIONS. This organism DOES NOT demonstrate inducible Clindamycin resistance in vitro. CRITICAL RESULT CALLED TO, READ BACK BY AND VERIFIED WITH: ADRIENNE @0843  ON  01/12/15 BY KENDR Performed at [facility]    Report Status 01/12/2015 FINAL  Final   Organism ID, Bacteria METHICILLIN RESISTANT STAPHYLOCOCCUS AUREUS  Final      Susceptibility   Methicillin resistant staphylococcus aureus - MIC*    CLINDAMYCIN <=0.25 SENSITIVE Sensitive     ERYTHROMYCIN >=8 RESISTANT Resistant     GENTAMICIN <=0.5 SENSITIVE Sensitive     LEVOFLOXACIN 0.25  SENSITIVE Sensitive     OXACILLIN >=4 RESISTANT Resistant     RIFAMPIN <=0.5 SENSITIVE Sensitive     TRIMETH/SULFA <=10 SENSITIVE Sensitive     VANCOMYCIN 1 SENSITIVE Sensitive     TETRACYCLINE >=16 RESISTANT Resistant     * ABUNDANT METHICILLIN RESISTANT STAPHYLOCOCCUS AUREUS     Labs: Basic Metabolic Panel:  Recent Labs Lab 01/09/15 0322 01/09/15 0745 01/10/15 0405 01/12/15 0550  NA 135  --  138 139  K 3.7  --  4.2 3.9  CL 105  --  107 107  CO2 24  --  26 24  GLUCOSE 125*  --  81 95  BUN 18  --  15 16  CREATININE 1.05 1.18 1.17 1.09  CALCIUM 8.4*  --  8.3* 8.5*   Liver Function Tests: No results for input(s): AST, ALT, ALKPHOS, BILITOT, PROT, ALBUMIN in the last 168 hours. No results for input(s): LIPASE, AMYLASE in the last 168 hours. No results for input(s): AMMONIA in the last 168 hours. CBC:  Recent Labs Lab 01/09/15 0322 01/09/15 0745 01/12/15 0550  WBC 13.1* 10.5 5.5  NEUTROABS 10.3*  --   --   HGB 15.2 15.0 14.3  HCT 43.2 44.2 42.6  MCV 87.4 87.7 88.6  PLT 204 213 239   Cardiac Enzymes: No results for input(s): CKTOTAL, CKMB, CKMBINDEX, TROPONINI in the last 168 hours. BNP: BNP (last 3 results) No results for input(s): BNP in the last 8760 hours.  ProBNP (last 3 results) No results for input(s): PROBNP in the last 8760 hours.  CBG: No results for input(s): GLUCAP in the last 168 hours.     SignedDelfina Redwood  [facility] 01/12/2015, 12:38 PM

## 2015-01-12 NOTE — Progress Notes (Signed)
CM placed [facility] resource number [phone] on pt's AVS with instructions to call, follow prompts to secure a PCP.  No other CM needs were communicated.

## 2015-01-12 NOTE — Care Management Note (Signed)
Case Management Note  Patient Details  Name: Manuel Barrett MRN: [id number] Date of Birth: Jun 22, 1945  Subjective/Objective:                    Action/Plan:   Expected Discharge Date:                  Expected Discharge Plan:     In-House Referral:     Discharge planning Services     Post Acute Care Choice:    Choice offered to:     DME Arranged:    DME Agency:     HH Arranged:    [place] Agency:     Status of Service:     Medicare Important Message Given:  yes Date Medicare IM Given:   01/12/15 Medicare IM give by:   Frann Rider, RN, BSN Date Additional Medicare IM Given:    Additional Medicare Important Message give by:     If discussed at [place] of Stay Meetings, dates discussed:    Additional Comments:  Norina Buzzard, RN 01/12/2015, 10:53 AM

## 2015-01-14 DIAGNOSIS — M79644 Pain in right finger(s): Secondary | ICD-10-CM | POA: Diagnosis not present

## 2015-01-14 DIAGNOSIS — Z4789 Encounter for other orthopedic aftercare: Secondary | ICD-10-CM | POA: Diagnosis not present

## 2015-01-14 LAB — ANAEROBIC CULTURE

## 2015-01-14 LAB — CULTURE, BLOOD (ROUTINE X 2)
CULTURE: NO GROWTH
CULTURE: NO GROWTH

## 2015-01-17 DIAGNOSIS — Z4789 Encounter for other orthopedic aftercare: Secondary | ICD-10-CM | POA: Diagnosis not present

## 2015-01-17 DIAGNOSIS — M79644 Pain in right finger(s): Secondary | ICD-10-CM | POA: Diagnosis not present

## 2015-07-02 DIAGNOSIS — J209 Acute bronchitis, unspecified: Secondary | ICD-10-CM | POA: Diagnosis not present

## 2015-07-08 ENCOUNTER — Emergency Department (HOSPITAL_COMMUNITY)
Admission: EM | Admit: 2015-07-08 | Discharge: 2015-07-08 | Disposition: A | Discharge status: Home / Self Care | Payer: Medicare Other | Attending: Emergency Medicine | Admitting: Emergency Medicine

## 2015-07-08 ENCOUNTER — Emergency Department (HOSPITAL_COMMUNITY): Payer: Medicare Other

## 2015-07-08 ENCOUNTER — Encounter (HOSPITAL_COMMUNITY): Payer: Self-pay

## 2015-07-08 DIAGNOSIS — I2510 Atherosclerotic heart disease of native coronary artery without angina pectoris: Secondary | ICD-10-CM | POA: Insufficient documentation

## 2015-07-08 DIAGNOSIS — Z7982 Long term (current) use of aspirin: Secondary | ICD-10-CM | POA: Insufficient documentation

## 2015-07-08 DIAGNOSIS — E860 Dehydration: Secondary | ICD-10-CM | POA: Diagnosis not present

## 2015-07-08 DIAGNOSIS — Z87891 Personal history of nicotine dependence: Secondary | ICD-10-CM | POA: Diagnosis not present

## 2015-07-08 DIAGNOSIS — R531 Weakness: Secondary | ICD-10-CM | POA: Diagnosis not present

## 2015-07-08 DIAGNOSIS — Z791 Long term (current) use of non-steroidal anti-inflammatories (NSAID): Secondary | ICD-10-CM | POA: Diagnosis not present

## 2015-07-08 DIAGNOSIS — R6883 Chills (without fever): Secondary | ICD-10-CM | POA: Diagnosis not present

## 2015-07-08 DIAGNOSIS — I10 Essential (primary) hypertension: Secondary | ICD-10-CM | POA: Diagnosis not present

## 2015-07-08 DIAGNOSIS — I739 Peripheral vascular disease, unspecified: Secondary | ICD-10-CM | POA: Insufficient documentation

## 2015-07-08 DIAGNOSIS — R809 Proteinuria, unspecified: Secondary | ICD-10-CM | POA: Diagnosis not present

## 2015-07-08 DIAGNOSIS — R55 Syncope and collapse: Secondary | ICD-10-CM | POA: Insufficient documentation

## 2015-07-08 DIAGNOSIS — R05 Cough: Secondary | ICD-10-CM | POA: Diagnosis not present

## 2015-07-08 DIAGNOSIS — Z79899 Other long term (current) drug therapy: Secondary | ICD-10-CM | POA: Insufficient documentation

## 2015-07-08 DIAGNOSIS — R110 Nausea: Secondary | ICD-10-CM | POA: Diagnosis not present

## 2015-07-08 DIAGNOSIS — K2970 Gastritis, unspecified, without bleeding: Secondary | ICD-10-CM | POA: Diagnosis not present

## 2015-07-08 LAB — CBC WITH DIFFERENTIAL/PLATELET
Basophils Absolute: 0 10*3/uL (ref 0.0–0.1)
Basophils Relative: 0 %
EOS PCT: 2 %
Eosinophils Absolute: 0.2 10*3/uL (ref 0.0–0.7)
HCT: 49.8 % (ref 39.0–52.0)
Hemoglobin: 17 g/dL (ref 13.0–17.0)
LYMPHS ABS: 1.1 10*3/uL (ref 0.7–4.0)
LYMPHS PCT: 10 %
MCH: 30.3 pg (ref 26.0–34.0)
MCHC: 34.1 g/dL (ref 30.0–36.0)
MCV: 88.8 fL (ref 78.0–100.0)
MONO ABS: 0.5 10*3/uL (ref 0.1–1.0)
MONOS PCT: 4 %
Neutro Abs: 10 10*3/uL — ABNORMAL HIGH (ref 1.7–7.7)
Neutrophils Relative %: 84 %
PLATELETS: 176 10*3/uL (ref 150–400)
RBC: 5.61 MIL/uL (ref 4.22–5.81)
RDW: 12.7 % (ref 11.5–15.5)
WBC: 11.9 10*3/uL — ABNORMAL HIGH (ref 4.0–10.5)

## 2015-07-08 LAB — URINALYSIS, ROUTINE W REFLEX MICROSCOPIC
GLUCOSE, UA: NEGATIVE mg/dL
HGB URINE DIPSTICK: NEGATIVE
Ketones, ur: NEGATIVE mg/dL
Leukocytes, UA: NEGATIVE
Nitrite: NEGATIVE
Protein, ur: 100 mg/dL — AB
SPECIFIC GRAVITY, URINE: 1.02 (ref 1.005–1.030)
pH: 5 (ref 5.0–8.0)

## 2015-07-08 LAB — COMPREHENSIVE METABOLIC PANEL
ALT: 41 U/L (ref 17–63)
AST: 36 U/L (ref 15–41)
Albumin: 4 g/dL (ref 3.5–5.0)
Alkaline Phosphatase: 102 U/L (ref 38–126)
Anion gap: 8 (ref 5–15)
BUN: 27 mg/dL — ABNORMAL HIGH (ref 6–20)
CHLORIDE: 105 mmol/L (ref 101–111)
CO2: 26 mmol/L (ref 22–32)
CREATININE: 1.31 mg/dL — AB (ref 0.61–1.24)
Calcium: 8.8 mg/dL — ABNORMAL LOW (ref 8.9–10.3)
GFR, EST NON AFRICAN AMERICAN: 55 mL/min — AB (ref >60)
Glucose, Bld: 139 mg/dL — ABNORMAL HIGH (ref 65–99)
Potassium: 4.6 mmol/L (ref 3.5–5.1)
Sodium: 139 mmol/L (ref 135–145)
Total Bilirubin: 0.8 mg/dL (ref 0.3–1.2)
Total Protein: 7.5 g/dL (ref 6.5–8.1)

## 2015-07-08 LAB — URINE MICROSCOPIC-ADD ON
RBC / HPF: NONE SEEN RBC/hpf (ref 0–5)
WBC, UA: NONE SEEN WBC/hpf (ref 0–5)

## 2015-07-08 LAB — LIPASE, BLOOD: LIPASE: 42 U/L (ref 11–51)

## 2015-07-08 LAB — I-STAT CG4 LACTIC ACID, ED: LACTIC ACID, VENOUS: 1.71 mmol/L (ref 0.5–2.0)

## 2015-07-08 MED ORDER — SODIUM CHLORIDE 0.9 % IV BOLUS (SEPSIS)
1000.0000 mL | Freq: Once | INTRAVENOUS | Status: AC
  Administered 2015-07-08: 1000 mL via INTRAVENOUS

## 2015-07-08 MED ORDER — FAMOTIDINE 20 MG PO TABS: 20.0000 mg | ORAL_TABLET | Freq: Two times a day (BID) | ORAL | Status: DC

## 2015-07-08 MED ORDER — ONDANSETRON 4 MG PO TBDP: 4.0000 mg | ORAL_TABLET | Freq: Three times a day (TID) | ORAL | Status: DC | PRN

## 2015-07-08 MED ORDER — ONDANSETRON HCL 4 MG/2ML IJ SOLN
4.0000 mg | Freq: Once | INTRAMUSCULAR | Status: AC
  Administered 2015-07-08: 4 mg via INTRAVENOUS
  Filled 2015-07-08: qty 2

## 2015-07-08 MED ORDER — ONDANSETRON 4 MG PO TBDP
4.0000 mg | ORAL_TABLET | Freq: Once | ORAL | Status: AC
  Administered 2015-07-08: 4 mg via ORAL
  Filled 2015-07-08: qty 1

## 2015-07-08 NOTE — Discharge Instructions (Signed)

## 2015-07-08 NOTE — ED Provider Notes (Signed)
CSN: [id number]     Arrival date & time 07/08/15  1026 History   First MD Initiated Contact with Patient 07/08/15 1033     Chief Complaint  Patient presents with  . Near Syncope     (Consider location/radiation/quality/duration/timing/severity/associated sxs/prior Treatment) HPI  The patient is a 70 year old male, he comes in by ambulance transport after the patient was having some lightheadedness and near syncope while driving in his car. He reports that over the last week he has had a upper respiratory illness with frequent coughing, nausea, decreased appetite and has had very little to eat or drink over the last several days. He woke up this morning feeling much better so he went back to work after being taken out of work for the last week by urgent care who saw him for his initial upper respiratory illness. He has also had one day of diarrhea. He states that when he got to work he was feeling okay but over the course of the first hour or 2 he started to get lightheaded, nauseated and was not feeling well. A coworker noticed that he was feeling clammy and pale and told him to go home. On his way home driving he felt lightheaded, pulled over to the service station where he called his wife to come pick him up. Instead she called the ambulance to transport him to the hospital. He denies chest pain, swelling of the legs, he does have some epigastric discomfort. He denies shortness of breath and has had no coughing this morning, he denies numbness or weakness of the legs, he denies sore throat, nasal congestion or headache. He states that it is worse when he stands up, better when he lays down. Paramedics noted the patient had a normal blood sugar, normal vital signs  Past Medical History  Diagnosis Date  . Hypertension   . GERD (gastroesophageal reflux disease)   . PAD (peripheral artery disease) ([place]) 1994  . CAD in native artery    Past Surgical History  Procedure Laterality Date  .  Femoral-popliteal bypass graft  1994    Dr. Amedeo Plenty  . 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 Back surgery    . Cardiac cath  03/14/14    non obstructive disease  . Left heart catheterization with coronary angiogram N/A 03/06/2014    Procedure: LEFT HEART CATHETERIZATION WITH CORONARY ANGIOGRAM;  Surgeon: Peter M Martinique, MD;  Location: [facility] CATH LAB;  Service: Cardiovascular;  Laterality: N/A;  . I&d extremity Right 01/09/2015    Procedure: IRRIGATION AND DEBRIDEMENT RIGHT THUMB/HAND ;  Surgeon: Roseanne Kaufman, MD;  Location: [facility];  Service: Orthopedics;  Laterality: Right;   Family History  Problem Relation Age of Onset  . Heart failure Father   . COPD Father    Social History  Substance Use Topics  . Smoking status: Former Smoker    Quit date: 04/20/1992  . Smokeless tobacco: Never Used  . Alcohol Use: No    Review of Systems  All other systems reviewed and are negative.     Allergies  Review of patient's allergies indicates no known allergies.  Home Medications   Prior to Admission medications   Medication Sig Start Date End Date Taking? Authorizing Provider  acetaminophen (TYLENOL) 500 MG tablet Take 1,500 mg by mouth every 6 (six) hours as needed for moderate pain.   Yes Historical Provider, MD  aspirin 325 MG tablet Take 325 mg by mouth daily.   Yes Historical Provider,  MD  atorvastatin (LIPITOR) 40 MG tablet Take 1 tablet (40 mg total) by mouth daily at 6 PM. 03/06/14  Yes Rhonda G Barrett, PA-C  ibuprofen (ADVIL,MOTRIN) 200 MG tablet Take 400 mg by mouth every 6 (six) hours as needed for mild pain.   Yes Historical Provider, MD  metoprolol (LOPRESSOR) 50 MG tablet Take 50 mg by mouth 2 (two) times daily.   Yes Historical Provider, MD  nitroGLYCERIN (NITROSTAT) 0.4 MG SL tablet Place 1 tablet (0.4 mg total) under the tongue every 5 (five) minutes as needed for chest pain. 03/09/14  Yes Peter M Martinique, MD  omeprazole (PRILOSEC) 20 MG capsule Take 20 mg by  mouth daily as needed (acid reflux).    Yes Historical Provider, MD  triamterene-hydrochlorothiazide (MAXZIDE-25) 37.5-25 MG per tablet Take 1 tablet by mouth daily.   Yes Historical Provider, MD  famotidine (PEPCID) 20 MG tablet Take 1 tablet (20 mg total) by mouth 2 (two) times daily. 07/08/15   [name], MD  metoprolol succinate (TOPROL XL) 50 MG 24 hr tablet Take 75mg  (1.5 tablets) Daily Patient not taking: Reported on 07/08/2015 03/28/14   [name], NP  naproxen (NAPROSYN) 500 MG tablet Take 1 tablet (500 mg total) by mouth 2 (two) times daily with a meal. Patient not taking: Reported on 01/09/2015 06/11/14   [name], MD  ondansetron (ZOFRAN ODT) 4 MG disintegrating tablet Take 1 tablet (4 mg total) by mouth every 8 (eight) hours as needed for nausea. 07/08/15   [name], MD   BP 127/63 mmHg  Pulse 53  Temp(Src) 98.1 F (36.7 C) (Oral)  Resp 14  Ht 5\' 10"  (1.778 m)  Wt 180 lb (81.647 kg)  BMI 25.83 kg/m2  SpO2 93% Physical Exam  Constitutional: He appears well-developed and well-nourished. No distress.  HENT:  Head: Normocephalic and atraumatic.  Mouth/Throat: Oropharynx is clear and moist. No oropharyngeal exudate.  Eyes: Conjunctivae and EOM are normal. Pupils are equal, round, and reactive to light. Right eye exhibits no discharge. Left eye exhibits no discharge. No scleral icterus.  Neck: Normal range of motion. Neck supple. No JVD present. No thyromegaly present.  Cardiovascular: Normal rate, regular rhythm, normal heart sounds and intact distal pulses.  Exam reveals no gallop and no friction rub.   No murmur heard. Pulmonary/Chest: Effort normal and breath sounds normal. No respiratory distress. He has no wheezes. He has no rales.  Abdominal: Soft. Bowel sounds are normal. He exhibits no distension and no mass. There is tenderness ( Minimal epigastric tenderness, no Murphy sign, no lower abdominal tenderness, no guarding).  Musculoskeletal: Normal range of motion.  He exhibits no edema or tenderness.  Lymphadenopathy:    He has no cervical adenopathy.  Neurological: He is alert. Coordination normal.  Skin: Skin is warm and dry. No rash noted. No erythema.  Psychiatric: He has a normal mood and affect. His behavior is normal.  Nursing note and vitals reviewed.   ED Course  Procedures (including critical care time) Labs Review Labs Reviewed  URINALYSIS, ROUTINE W REFLEX MICROSCOPIC (NOT AT [facility]) - Abnormal; Notable for the following:    Bilirubin Urine SMALL (*)    Protein, ur 100 (*)    All other components within normal limits  CBC WITH DIFFERENTIAL/PLATELET - Abnormal; Notable for the following:    WBC 11.9 (*)    Neutro Abs 10.0 (*)    All other components within normal limits  COMPREHENSIVE METABOLIC PANEL - Abnormal; Notable for the following:  Glucose, Bld 139 (*)    BUN 27 (*)    Creatinine, Ser 1.31 (*)    Calcium 8.8 (*)    GFR calc non Af Amer 55 (*)    All other components within normal limits  URINE MICROSCOPIC-ADD ON - Abnormal; Notable for the following:    Squamous Epithelial / LPF 0-5 (*)    Bacteria, UA FEW (*)    Casts GRANULAR CAST (*)    All other components within normal limits  LIPASE, BLOOD  I-STAT CG4 LACTIC ACID, ED  I-STAT CG4 LACTIC ACID, ED    Imaging Review Dg Chest 2 View  07/08/2015  CLINICAL DATA:  Cough, congestion and dizziness for 1 week. Initial encounter. EXAM: CHEST  2 VIEW COMPARISON:  Single view of the chest 03/05/2014. FINDINGS: Mild linear opacity in the right lung base is most compatible with atelectasis. Mild atelectasis or scar in the left lung base is unchanged. The lungs are otherwise clear. Heart size is normal. No pneumothorax or pleural effusion. No focal bony abnormality. IMPRESSION: Mild bibasilar atelectasis.  No consolidative process is identified. Electronically Signed   By: Inge Rise M.D.   On: 07/08/2015 11:34   I have personally reviewed and evaluated these images and  lab results as part of my medical decision-making.   EKG Interpretation   Date/Time:  Monday July 08 2015 10:31:25 EDT Ventricular Rate:  61 PR Interval:  192 QRS Duration: 102 QT Interval:  441 QTC Calculation: 444 R Axis:   52 Text Interpretation:  Sinus rhythm Probable anteroseptal infarct, old  Baseline wander in lead(s) I II aVR V2 since last tracing no significant  change Confirmed by Jaelah Hauth  MD, Shawan Tosh ([id number]) on 07/08/2015 10:49:40 AM      MDM   Final diagnoses:  Near syncope  Dehydration  Proteinuria    The patient is able to do everything I ask including his neurologic function. We'll check orthostatic vital signs, he likely has a slight malnourishment and dehydration relative to his last 7 days of illness. Check kidney function, blood counts, lipase. Zofran and fluids ordered, patient is in agreement with the plan. EKG is unremarkable.  Pt is well after med / fluids - VS normal - no orthostatic changes Protein in urine Otherwise labs without acute findings  Meds given in ED:  Medications  sodium chloride 0.9 % bolus 1,000 mL (0 mLs Intravenous Stopped 07/08/15 1206)  ondansetron (ZOFRAN) injection 4 mg (4 mg Intravenous Given 07/08/15 1106)  ondansetron (ZOFRAN-ODT) disintegrating tablet 4 mg (4 mg Oral Given 07/08/15 1341)    New Prescriptions   FAMOTIDINE (PEPCID) 20 MG TABLET    Take 1 tablet (20 mg total) by mouth 2 (two) times daily.   ONDANSETRON (ZOFRAN ODT) 4 MG DISINTEGRATING TABLET    Take 1 tablet (4 mg total) by mouth every 8 (eight) hours as needed for nausea.      [name], MD 07/08/15 1349

## 2015-07-08 NOTE — ED Notes (Signed)
Pt states she had diarrhea last week. States he was a work today and had a near syncopal episode. Complain of abdominal pain and chills now

## 2015-07-08 NOTE — ED Notes (Signed)
Pt was given Zofran 4 mg IV prior to arrival 

## 2015-07-11 ENCOUNTER — Emergency Department (HOSPITAL_COMMUNITY): Payer: Medicare Other

## 2015-07-11 ENCOUNTER — Emergency Department (HOSPITAL_COMMUNITY)
Admission: EM | Admit: 2015-07-11 | Discharge: 2015-07-11 | Disposition: A | Discharge status: Home / Self Care | Payer: Medicare Other | Attending: Emergency Medicine | Admitting: Emergency Medicine

## 2015-07-11 ENCOUNTER — Encounter (HOSPITAL_COMMUNITY): Payer: Self-pay | Admitting: *Deleted

## 2015-07-11 DIAGNOSIS — I2510 Atherosclerotic heart disease of native coronary artery without angina pectoris: Secondary | ICD-10-CM | POA: Diagnosis not present

## 2015-07-11 DIAGNOSIS — I10 Essential (primary) hypertension: Secondary | ICD-10-CM | POA: Diagnosis not present

## 2015-07-11 DIAGNOSIS — R112 Nausea with vomiting, unspecified: Secondary | ICD-10-CM | POA: Diagnosis present

## 2015-07-11 DIAGNOSIS — Z87891 Personal history of nicotine dependence: Secondary | ICD-10-CM | POA: Insufficient documentation

## 2015-07-11 DIAGNOSIS — R109 Unspecified abdominal pain: Secondary | ICD-10-CM | POA: Insufficient documentation

## 2015-07-11 DIAGNOSIS — Z791 Long term (current) use of non-steroidal anti-inflammatories (NSAID): Secondary | ICD-10-CM | POA: Diagnosis not present

## 2015-07-11 DIAGNOSIS — Z7982 Long term (current) use of aspirin: Secondary | ICD-10-CM | POA: Insufficient documentation

## 2015-07-11 DIAGNOSIS — B349 Viral infection, unspecified: Secondary | ICD-10-CM | POA: Insufficient documentation

## 2015-07-11 DIAGNOSIS — Z79899 Other long term (current) drug therapy: Secondary | ICD-10-CM | POA: Diagnosis not present

## 2015-07-11 DIAGNOSIS — J439 Emphysema, unspecified: Secondary | ICD-10-CM | POA: Diagnosis not present

## 2015-07-11 LAB — CBC
HEMATOCRIT: 48 % (ref 39.0–52.0)
Hemoglobin: 16.5 g/dL (ref 13.0–17.0)
MCH: 29.9 pg (ref 26.0–34.0)
MCHC: 34.4 g/dL (ref 30.0–36.0)
MCV: 87 fL (ref 78.0–100.0)
PLATELETS: 222 10*3/uL (ref 150–400)
RBC: 5.52 MIL/uL (ref 4.22–5.81)
RDW: 12.5 % (ref 11.5–15.5)
WBC: 7.9 10*3/uL (ref 4.0–10.5)

## 2015-07-11 LAB — COMPREHENSIVE METABOLIC PANEL
ALBUMIN: 4 g/dL (ref 3.5–5.0)
ALK PHOS: 99 U/L (ref 38–126)
ALT: 28 U/L (ref 17–63)
AST: 28 U/L (ref 15–41)
Anion gap: 9 (ref 5–15)
BILIRUBIN TOTAL: 0.9 mg/dL (ref 0.3–1.2)
BUN: 16 mg/dL (ref 6–20)
CALCIUM: 9.1 mg/dL (ref 8.9–10.3)
CO2: 24 mmol/L (ref 22–32)
Chloride: 105 mmol/L (ref 101–111)
Creatinine, Ser: 1.17 mg/dL (ref 0.61–1.24)
GFR calc Af Amer: >60 mL/min (ref >60)
GLUCOSE: 118 mg/dL — AB (ref 65–99)
POTASSIUM: 3.6 mmol/L (ref 3.5–5.1)
Sodium: 138 mmol/L (ref 135–145)
TOTAL PROTEIN: 7.4 g/dL (ref 6.5–8.1)

## 2015-07-11 LAB — URINALYSIS, ROUTINE W REFLEX MICROSCOPIC
Bilirubin Urine: NEGATIVE
Glucose, UA: NEGATIVE mg/dL
Hgb urine dipstick: NEGATIVE
KETONES UR: NEGATIVE mg/dL
LEUKOCYTES UA: NEGATIVE
NITRITE: NEGATIVE
PROTEIN: NEGATIVE mg/dL
Specific Gravity, Urine: 1.015 (ref 1.005–1.030)
pH: 5 (ref 5.0–8.0)

## 2015-07-11 LAB — TROPONIN I

## 2015-07-11 LAB — LIPASE, BLOOD: Lipase: 41 U/L (ref 11–51)

## 2015-07-11 MED ORDER — IOHEXOL 300 MG/ML  SOLN
100.0000 mL | Freq: Once | INTRAMUSCULAR | Status: AC | PRN
  Administered 2015-07-11: 100 mL via INTRAVENOUS

## 2015-07-11 MED ORDER — ONDANSETRON HCL 4 MG/2ML IJ SOLN: 4.0000 mg | Freq: Once | INTRAMUSCULAR | Status: DC

## 2015-07-11 MED ORDER — SODIUM CHLORIDE 0.9 % IV SOLN
INTRAVENOUS | Status: DC
  Administered 2015-07-11: 13:00:00 via INTRAVENOUS

## 2015-07-11 MED ORDER — SODIUM CHLORIDE 0.9 % IV BOLUS (SEPSIS)
500.0000 mL | Freq: Once | INTRAVENOUS | Status: AC
  Administered 2015-07-11: 500 mL via INTRAVENOUS

## 2015-07-11 MED ORDER — ONDANSETRON HCL 4 MG/2ML IJ SOLN
4.0000 mg | Freq: Once | INTRAMUSCULAR | Status: AC | PRN
  Administered 2015-07-11: 4 mg via INTRAVENOUS
  Filled 2015-07-11: qty 2

## 2015-07-11 NOTE — ED Notes (Signed)
Pt c/o mid abdominal pain and vomiting. Pt reports he was here in [place] 2 days ago with same symptoms and was discharged home after given IVF. Pt reports 3 episodes of vomiting this morning. Pt says he has been "sick" for 2 weeks now, reports of dizziness at times.

## 2015-07-11 NOTE — Discharge Instructions (Signed)
Workup without any significant findings. May very well be the viral illness other family members have had. Chest x-ray negative for pneumonia head CT negative abdominal CT negative. Labs are normal today. Would recommend taking the Zofran on a regular basis. Work note provided to be out of work until Monday. Return for any new or worse symptoms recommend make an appointment to follow-up with your [place] provider.

## 2015-07-11 NOTE — ED Provider Notes (Signed)
CSN: [id number]     Arrival date & time 07/11/15  1016 History  By signing my name below, I, Manuel Barrett, attest that this documentation has been prepared under the direction and in the presence of Fredia Sorrow, MD. Electronically Signed: [name], ED Scribe. 07/11/2015. 11:42 AM.   Chief Complaint  Patient presents with  . Emesis   The history is provided by the patient. No language interpreter was used.     HPI Comments: Manuel Barrett is a 70 y.o. male who presents to the Emergency Department complaining of subjective "knot" to epigastric area x 2 weeks. Pt also complains of 2 episodes of vomiting that began today. He reports that for the past 2 weeks he has been having loss of appetite, nausea, cough, and dizziness. He was seen in the ED 4 days ago after having a near syncopal episode. Pt was given IV fluids and discharged home. He returns today due to the vomiting which is new. Denies hematochezia or any other associated symptoms.   PCP - Manuel Barrett   Past Medical History  Diagnosis Date  . Hypertension   . GERD (gastroesophageal reflux disease)   . PAD (peripheral artery disease) ([place]) 1994  . CAD in native artery    Past Surgical History  Procedure Laterality Date  . Femoral-popliteal bypass graft  1994    Dr. Amedeo Barrett  . 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 Back surgery    . Cardiac cath  03/14/14    non obstructive disease  . Left heart catheterization with coronary angiogram N/A 03/06/2014    Procedure: LEFT HEART CATHETERIZATION WITH CORONARY ANGIOGRAM;  Surgeon: Manuel M Martinique, MD;  Location: [facility] CATH LAB;  Service: Cardiovascular;  Laterality: N/A;  . I&d extremity Right 01/09/2015    Procedure: IRRIGATION AND DEBRIDEMENT RIGHT THUMB/HAND ;  Surgeon: Manuel Kaufman, MD;  Location: [facility];  Service: Orthopedics;  Laterality: Right;   Family History  Problem Relation Age of Onset  . Heart failure Father   . COPD Father    Social History   Substance Use Topics  . Smoking status: Former Smoker -- 1.50 packs/day for 30 years    Types: Cigarettes    Quit date: 04/20/1992  . Smokeless tobacco: Never Used  . Alcohol Use: 0.0 oz/week    0 Standard drinks or equivalent per week     Comment: 1 beer occasionally     Review of Systems  Constitutional: Positive for chills. Negative for fever.  HENT: Negative for congestion, rhinorrhea and sore throat.   Eyes: Negative for visual disturbance.  Respiratory: Positive for cough and shortness of breath.   Cardiovascular: Positive for chest pain. Negative for leg swelling.  Gastrointestinal: Positive for nausea, vomiting and abdominal pain. Negative for diarrhea and blood in stool.  Genitourinary: Negative for dysuria and hematuria.  Musculoskeletal: Positive for back pain. Negative for neck pain.  Skin: Negative for rash.  Neurological: Positive for dizziness and headaches.       + near syncope  Hematological: Does not bruise/bleed easily.  Psychiatric/Behavioral: Negative for confusion.      Allergies  Review of patient's allergies indicates no known allergies.  Home Medications   Prior to Admission medications   Medication Sig Start Date End Date Taking? Authorizing Provider  acetaminophen (TYLENOL) 500 MG tablet Take 1,500 mg by mouth every 6 (six) hours as needed for moderate pain.   Yes Historical Provider, MD  acidophilus (RISAQUAD) CAPS capsule Take 1  capsule by mouth daily.   Yes Historical Provider, MD  aspirin 325 MG tablet Take 325 mg by mouth daily.   Yes Historical Provider, MD  atorvastatin (LIPITOR) 40 MG tablet Take 1 tablet (40 mg total) by mouth daily at 6 PM. 03/06/14  Yes Manuel G Barrett, PA-C  famotidine (PEPCID) 20 MG tablet Take 1 tablet (20 mg total) by mouth 2 (two) times daily. 07/08/15  Yes Manuel Chapel, MD  ibuprofen (ADVIL,MOTRIN) 200 MG tablet Take 400 mg by mouth every 6 (six) hours as needed for mild pain.   Yes Historical Provider, MD   metoprolol (LOPRESSOR) 50 MG tablet Take 50 mg by mouth 2 (two) times daily.   Yes Historical Provider, MD  nitroGLYCERIN (NITROSTAT) 0.4 MG SL tablet Place 1 tablet (0.4 mg total) under the tongue every 5 (five) minutes as needed for chest pain. 03/09/14  Yes Manuel M Martinique, MD  omeprazole (PRILOSEC) 20 MG capsule Take 20 mg by mouth daily as needed (acid reflux).    Yes Historical Provider, MD  ondansetron (ZOFRAN ODT) 4 MG disintegrating tablet Take 1 tablet (4 mg total) by mouth every 8 (eight) hours as needed for nausea. 07/08/15  Yes Manuel Chapel, MD  triamterene-hydrochlorothiazide (MAXZIDE-25) 37.5-25 MG per tablet Take 1 tablet by mouth daily.   Yes Historical Provider, MD  metoprolol succinate (TOPROL XL) 50 MG 24 hr tablet Take 75mg  (1.5 tablets) Daily Patient not taking: Reported on 07/08/2015 03/28/14   Manuel Serge, NP  naproxen (NAPROSYN) 500 MG tablet Take 1 tablet (500 mg total) by mouth 2 (two) times daily with a meal. Patient not taking: Reported on 01/09/2015 06/11/14   Manuel Chapel, MD   BP 123/64 mmHg  Pulse 56  Temp(Src) 97.8 F (36.6 C) (Axillary)  Resp 16  Ht 5\' 10"  (1.778 m)  Wt 78.472 kg  BMI 24.82 kg/m2  SpO2 99%   Physical Exam  Constitutional: He is oriented to person, place, and time. He appears well-developed and well-nourished. No distress.  HENT:  Head: Normocephalic and atraumatic.  Mouth/Throat: Mucous membranes are normal.  Eyes: Conjunctivae and EOM are normal. Pupils are equal, round, and reactive to light. Right eye exhibits no discharge. Left eye exhibits no discharge.  Neck: Neck supple. No tracheal deviation present.  Cardiovascular: Normal rate, regular rhythm and normal heart sounds.   Pulmonary/Chest: Effort normal and breath sounds normal. No respiratory distress. He has no wheezes. He has no rhonchi. He has no rales.  Abdominal: Bowel sounds are normal. There is no tenderness.  Musculoskeletal: Normal range of motion. He exhibits no edema.   Neurological: He is alert and oriented to person, place, and time.  Skin: Skin is warm and dry.  Psychiatric: He has a normal mood and affect. His behavior is normal.  Nursing note and vitals reviewed.   ED Course  Procedures (including critical care time)  DIAGNOSTIC STUDIES: Oxygen Saturation is 98% on RA, normal by my interpretation.    COORDINATION OF CARE: 11:39 AM-Discussed treatment plan which includes CT A/P, CT Head, and CXR with pt at bedside and pt agreed to plan.   Labs Review Labs Reviewed  COMPREHENSIVE METABOLIC PANEL - Abnormal; Notable for the following:    Glucose, Bld 118 (*)    All other components within normal limits  LIPASE, BLOOD  CBC  URINALYSIS, ROUTINE W REFLEX MICROSCOPIC (NOT AT [facility])  TROPONIN I   Results for orders placed or performed during the hospital encounter of 07/11/15  Lipase, blood  Result Value Ref Range   Lipase 41 11 - 51 U/L  Comprehensive metabolic panel  Result Value Ref Range   Sodium 138 135 - 145 mmol/L   Potassium 3.6 3.5 - 5.1 mmol/L   Chloride 105 101 - 111 mmol/L   CO2 24 22 - 32 mmol/L   Glucose, Bld 118 (H) 65 - 99 mg/dL   BUN 16 6 - 20 mg/dL   Creatinine, Ser 1.17 0.61 - 1.24 mg/dL   Calcium 9.1 8.9 - 10.3 mg/dL   Total Protein 7.4 6.5 - 8.1 g/dL   Albumin 4.0 3.5 - 5.0 g/dL   AST 28 15 - 41 U/L   ALT 28 17 - 63 U/L   Alkaline Phosphatase 99 38 - 126 U/L   Total Bilirubin 0.9 0.3 - 1.2 mg/dL   GFR calc non Af Amer >60 >60 mL/min   GFR calc Af Amer >60 >60 mL/min   Anion gap 9 5 - 15  CBC  Result Value Ref Range   WBC 7.9 4.0 - 10.5 K/uL   RBC 5.52 4.22 - 5.81 MIL/uL   Hemoglobin 16.5 13.0 - 17.0 g/dL   HCT 48.0 39.0 - 52.0 %   MCV 87.0 78.0 - 100.0 fL   MCH 29.9 26.0 - 34.0 pg   MCHC 34.4 30.0 - 36.0 g/dL   RDW 12.5 11.5 - 15.5 %   Platelets 222 150 - 400 K/uL  Urinalysis, Routine w reflex microscopic (not at [facility])  Result Value Ref Range   Color, Urine YELLOW YELLOW   APPearance CLEAR CLEAR    Specific Gravity, Urine 1.015 1.005 - 1.030   pH 5.0 5.0 - 8.0   Glucose, UA NEGATIVE NEGATIVE mg/dL   Hgb urine dipstick NEGATIVE NEGATIVE   Bilirubin Urine NEGATIVE NEGATIVE   Ketones, ur NEGATIVE NEGATIVE mg/dL   Protein, ur NEGATIVE NEGATIVE mg/dL   Nitrite NEGATIVE NEGATIVE   Leukocytes, UA NEGATIVE NEGATIVE  Troponin I  Result Value Ref Range   Troponin I <0.03 <0.031 ng/mL     Imaging Review Dg Chest 2 View  07/11/2015  CLINICAL DATA:  Abdominal pain and vomiting. EXAM: CHEST - 2 VIEW COMPARISON:  Two-view chest x-ray 07/08/2015 an one-view chest x-ray 03/05/2014. FINDINGS: The heart size is normal. Emphysematous changes are again noted. Linear atelectasis or scarring is present at the lung bases. The upper lung fields are clear. The visualized soft tissues and bony thorax are unremarkable. IMPRESSION: 1. Emphysema. 2. Aeration is improved since the prior study. 3. Linear atelectasis or scarring at the lung bases bilaterally. 4. No other acute cardiopulmonary disease. Electronically Signed   By: San Morelle M.D.   On: 07/11/2015 12:32   Ct Head Wo Contrast  07/11/2015  CLINICAL DATA:  Abdominal pain and vomiting EXAM: CT HEAD WITHOUT CONTRAST TECHNIQUE: Contiguous axial images were obtained from the base of the skull through the vertex without intravenous contrast. COMPARISON:  None. FINDINGS: There is no evidence of mass effect, midline shift or extra-axial fluid collections. There is no evidence of a space-occupying lesion or intracranial hemorrhage. There is no evidence of a cortical-based area of acute infarction. The ventricles and sulci are appropriate for the patient's age. The basal cisterns are patent. Visualized portions of the orbits are unremarkable. The mastoid sinuses are clear. There is mild right maxillary sinus mucosal thickening. The osseous structures are unremarkable. IMPRESSION: No acute intracranial pathology. Electronically Signed   By: Kathreen Devoid   On:  07/11/2015 12:24   Ct  Abdomen Pelvis W Contrast  07/11/2015  CLINICAL DATA:  Abdominal pain and vomiting. EXAM: CT ABDOMEN AND PELVIS WITH CONTRAST TECHNIQUE: Multidetector CT imaging of the abdomen and pelvis was performed using the standard protocol following bolus administration of intravenous contrast. CONTRAST:  122mL OMNIPAQUE IOHEXOL 300 MG/ML  SOLN COMPARISON:  None. FINDINGS: Lower chest:  Clear lung bases.  Normal heart size. Hepatobiliary: Normal liver.  Normal gallbladder. Pancreas: Normal. Spleen: Normal. Adrenals/Urinary Tract: Normal adrenal glands. Normal kidneys. No urolithiasis or obstructive uropathy. Normal bladder. Stomach/Bowel: No bowel wall thickening or dilatation. No pneumatosis, pneumoperitoneum or portal venous gas. No abdominal or pelvic free fluid. Vascular/Lymphatic: Normal caliber abdominal aorta with atherosclerosis. Thrombosed left aorta iliac stent graft. No lymphadenopathy. Other: No fluid collection or hematoma. Musculoskeletal: No acute osseous abnormality. No aggressive lytic or sclerotic osseous lesion. Degenerative disc disease with disc height loss at L5-S1. Facet arthropathy throughout the lumbar spine. IMPRESSION: 1. No acute abdominal or pelvic pathology. Electronically Signed   By: Kathreen Devoid   On: 07/11/2015 12:23   I have personally reviewed and evaluated these images and lab results as part of my medical decision-making.   EKG Interpretation   Date/Time:  Thursday July 11 2015 12:38:05 EDT Ventricular Rate:  59 PR Interval:  192 QRS Duration: 104 QT Interval:  463 QTC Calculation: 459 R Axis:   50 Text Interpretation:  Sinus rhythm Baseline wander in lead(s) III aVL aVF  V1 No significant change since last tracing Reconfirmed by Belkys Henault  MD,  Lean Jaeger [phone]) on 07/11/2015 12:56:36 PM      MDM   Final diagnoses:  Viral illness    Patient symptoms are multisystem in nature. Family members did have something similar that lasted for 3 weeks  of this may be a viral illness. Workup here without any significant findings CT of abdomen was negative chest x-rays negative head CT's negative labs are normal today. Patient without a focal neuro exam. Have not completely ruled out the a recent stroke to cause the dizziness however patient's had the symptoms for 2 weeks so you would expect that to show up on CT scan. Feel that this is probably a viral process.    I personally performed the services described in this documentation, which was scribed in my presence. The recorded information has been reviewed and is accurate.        Fredia Sorrow, MD 07/11/15 1319

## 2015-07-11 NOTE — ED Notes (Signed)
MD at bedside. 

## 2015-08-08 ENCOUNTER — Encounter: Payer: Self-pay | Admitting: Family Medicine

## 2015-08-08 ENCOUNTER — Ambulatory Visit (INDEPENDENT_AMBULATORY_CARE_PROVIDER_SITE_OTHER): Payer: Medicare Other | Admitting: Family Medicine

## 2015-08-08 VITALS — BP 128/73 | HR 105 | Temp 97.4°F | Ht 70.0 in | Wt 180.2 lb

## 2015-08-08 DIAGNOSIS — E785 Hyperlipidemia, unspecified: Secondary | ICD-10-CM | POA: Diagnosis not present

## 2015-08-08 DIAGNOSIS — I2510 Atherosclerotic heart disease of native coronary artery without angina pectoris: Secondary | ICD-10-CM

## 2015-08-08 DIAGNOSIS — I10 Essential (primary) hypertension: Secondary | ICD-10-CM

## 2015-08-08 MED ORDER — METOPROLOL TARTRATE 50 MG PO TABS: 50.0000 mg | ORAL_TABLET | Freq: Two times a day (BID) | ORAL | Status: DC

## 2015-08-08 MED ORDER — ATORVASTATIN CALCIUM 40 MG PO TABS: 40.0000 mg | ORAL_TABLET | Freq: Every day | ORAL | Status: DC

## 2015-08-08 MED ORDER — TRIAMTERENE-HCTZ 37.5-25 MG PO TABS: 1.0000 | ORAL_TABLET | Freq: Every day | ORAL | Status: DC

## 2015-08-08 MED ORDER — OMEPRAZOLE 20 MG PO CPDR: 20.0000 mg | DELAYED_RELEASE_CAPSULE | Freq: Every day | ORAL | Status: AC | PRN

## 2015-08-08 NOTE — Progress Notes (Signed)
   HPI  Patient presents today here to establish care and to get some refills.  Hypertension Good medication lines Average systolic blood pressures at 130s over 40s at home, 28Z diastolic No chest pain, dyspnea, palpitations, leg edema.  Hyperlipidemia Not watching diet Active in general.  CAD No chest pain, good medication compliance Also has history of PAD with bypass graft of the right sided iliac artery  PMH: Smoking status noted His past medical, surgical, social, family history reviewed and updated in EMR ROS: Per HPI  Objective: BP 128/73 mmHg  Pulse 105  Temp(Src) 97.4 F (36.3 C) (Oral)  Ht '5\' 10"'$  (1.778 m)  Wt 180 lb 3.2 oz (81.738 kg)  BMI 25.86 kg/m2 Gen: NAD, alert, cooperative with exam HEENT: NCAT, EOMI CV: RRR, good S1/S2, no murmur Resp: CTABL, no wheezes, non-labored Abd: SNTND, BS present, no guarding or organomegaly Ext: No edema, warm Neuro: Alert and oriented, No gross deficits  Assessment and plan:  # Hypertension Refilled metoprolol and Maxzide Labs, fasting Follows primarily with the [place]  # Hyperlipidemia Refilled Lipitor Labs  # CAD, PAD No chest pain Continue beta blocker and aspirin      Orders Placed This Encounter  Procedures  . CMP14+EGFR    Standing Status: Future     Number of Occurrences:      Standing Expiration Date: 08/07/2016  . Lipid Panel    Standing Status: Future     Number of Occurrences:      Standing Expiration Date: 08/07/2016  . CBC with Differential    Standing Status: Future     Number of Occurrences:      Standing Expiration Date: 08/07/2016    Meds ordered this encounter  Medications  . atorvastatin (LIPITOR) 40 MG tablet    Sig: Take 1 tablet (40 mg total) by mouth daily at 6 PM.    Dispense:  90 tablet    Refill:  1  . metoprolol (LOPRESSOR) 50 MG tablet    Sig: Take 1 tablet (50 mg total) by mouth 2 (two) times daily.    Dispense:  180 tablet    Refill:  1  . omeprazole (PRILOSEC) 20  MG capsule    Sig: Take 1 capsule (20 mg total) by mouth daily as needed (acid reflux).    Dispense:  90 capsule    Refill:  1  . triamterene-hydrochlorothiazide (MAXZIDE-25) 37.5-25 MG tablet    Sig: Take 1 tablet by mouth daily.    Dispense:  90 tablet    Refill:  Gibson, MD [facility] 08/08/2015, 1:44 PM

## 2015-08-08 NOTE — Patient Instructions (Signed)
Great to meet you!  Lets plan on seeing you at least once a year, you are welcome to come back anytime you need for sick visits  Make a lab appointment to do fasting labs

## 2015-08-12 ENCOUNTER — Other Ambulatory Visit: Payer: Medicare Other

## 2015-08-12 DIAGNOSIS — I2510 Atherosclerotic heart disease of native coronary artery without angina pectoris: Secondary | ICD-10-CM

## 2015-08-12 DIAGNOSIS — E785 Hyperlipidemia, unspecified: Secondary | ICD-10-CM | POA: Diagnosis not present

## 2015-08-12 DIAGNOSIS — I10 Essential (primary) hypertension: Secondary | ICD-10-CM | POA: Diagnosis not present

## 2015-08-13 LAB — CMP14+EGFR
ALK PHOS: 116 IU/L (ref 39–117)
ALT: 19 IU/L (ref 0–44)
AST: 25 IU/L (ref 0–40)
Albumin/Globulin Ratio: 1.5 (ref 1.2–2.2)
Albumin: 4 g/dL (ref 3.6–4.8)
BILIRUBIN TOTAL: 0.6 mg/dL (ref 0.0–1.2)
BUN/Creatinine Ratio: 15 (ref 10–24)
BUN: 17 mg/dL (ref 8–27)
CO2: 24 mmol/L (ref 18–29)
CREATININE: 1.13 mg/dL (ref 0.76–1.27)
Calcium: 9.2 mg/dL (ref 8.6–10.2)
Chloride: 100 mmol/L (ref 96–106)
GFR calc non Af Amer: 67 mL/min/{1.73_m2} (ref >59)
GFR, EST AFRICAN AMERICAN: 77 mL/min/{1.73_m2} (ref >59)
GLOBULIN, TOTAL: 2.7 g/dL (ref 1.5–4.5)
Glucose: 102 mg/dL — ABNORMAL HIGH (ref 65–99)
POTASSIUM: 4.2 mmol/L (ref 3.5–5.2)
SODIUM: 140 mmol/L (ref 134–144)
Total Protein: 6.7 g/dL (ref 6.0–8.5)

## 2015-08-13 LAB — CBC WITH DIFFERENTIAL/PLATELET
BASOS ABS: 0 10*3/uL (ref 0.0–0.2)
Basos: 1 %
EOS (ABSOLUTE): 0.3 10*3/uL (ref 0.0–0.4)
Eos: 6 %
HEMATOCRIT: 47.7 % (ref 37.5–51.0)
HEMOGLOBIN: 15.7 g/dL (ref 12.6–17.7)
Immature Grans (Abs): 0 10*3/uL (ref 0.0–0.1)
Immature Granulocytes: 0 %
LYMPHS ABS: 1.7 10*3/uL (ref 0.7–3.1)
LYMPHS: 30 %
MCH: 30.1 pg (ref 26.6–33.0)
MCHC: 32.9 g/dL (ref 31.5–35.7)
MCV: 91 fL (ref 79–97)
MONOCYTES: 12 %
Monocytes Absolute: 0.6 10*3/uL (ref 0.1–0.9)
NEUTROS ABS: 2.9 10*3/uL (ref 1.4–7.0)
Neutrophils: 51 %
Platelets: 215 10*3/uL (ref 150–379)
RBC: 5.22 x10E6/uL (ref 4.14–5.80)
RDW: 13.8 % (ref 12.3–15.4)
WBC: 5.6 10*3/uL (ref 3.4–10.8)

## 2015-08-13 LAB — LIPID PANEL
CHOL/HDL RATIO: 4.6 ratio (ref 0.0–5.0)
Cholesterol, Total: 138 mg/dL (ref 100–199)
HDL: 30 mg/dL — ABNORMAL LOW (ref >39)
LDL CALC: 84 mg/dL (ref 0–99)
TRIGLYCERIDES: 121 mg/dL (ref 0–149)
VLDL Cholesterol Cal: 24 mg/dL (ref 5–40)

## 2016-03-13 ENCOUNTER — Ambulatory Visit (INDEPENDENT_AMBULATORY_CARE_PROVIDER_SITE_OTHER): Payer: Medicare Other | Admitting: Family Medicine

## 2016-03-13 ENCOUNTER — Encounter: Payer: Self-pay | Admitting: Family Medicine

## 2016-03-13 VITALS — BP 166/78 | HR 67 | Temp 97.0°F | Ht 70.0 in | Wt 179.2 lb

## 2016-03-13 DIAGNOSIS — I2510 Atherosclerotic heart disease of native coronary artery without angina pectoris: Secondary | ICD-10-CM | POA: Diagnosis not present

## 2016-03-13 DIAGNOSIS — M109 Gout, unspecified: Secondary | ICD-10-CM | POA: Diagnosis not present

## 2016-03-13 MED ORDER — METHYLPREDNISOLONE ACETATE 80 MG/ML IJ SUSP
80.0000 mg | Freq: Once | INTRAMUSCULAR | Status: AC
  Administered 2016-03-13: 80 mg via INTRAMUSCULAR

## 2016-03-13 MED ORDER — COLCHICINE 0.6 MG PO TABS: 0.6000 mg | ORAL_TABLET | Freq: Every day | ORAL | 1 refills | Status: DC

## 2016-03-13 NOTE — Progress Notes (Signed)
BP (!) 166/78   Pulse 67   Temp 97 F (36.1 C) (Oral)   Ht 5\' 10"  (1.778 m)   Wt 179 lb 3.2 oz (81.3 kg)   BMI 25.71 kg/m    Subjective:    Patient ID: Manuel Barrett, male    DOB: May 10, 1945, 70 y.o.   MRN: [id number]  HPI: Manuel Barrett is a 70 y.o. male presenting on 03/13/2016 for No chief complaint on file.   HPI Pain in toes bilateral Patient comes in today with complaints of pain in his toes bilaterally. The pain is been going on for the past 2 weeks. It started in his left great toe but that has started to subside and is almost gone but now is starting in his right great toe. He has had a gout attack once previously around 2 years ago but has not had one since. They gave him Indocin which has been taking over the past few days but then he started having a lot of nosebleeds and bruising so he backed off on the Indocin and is wondering if there is something else he can help with that. He denies any fevers or chills. There is redness warmth and swelling associated with the joint of his great toes bilaterally  Relevant past medical, surgical, family and social history reviewed and updated as indicated. Interim medical history since our last visit reviewed. Allergies and medications reviewed and updated.  Review of Systems  Constitutional: Negative for chills and fever.  Eyes: Negative for discharge.  Respiratory: Negative for shortness of breath and wheezing.   Cardiovascular: Negative for chest pain and leg swelling.  Musculoskeletal: Positive for arthralgias and joint swelling. Negative for back pain and gait problem.  Skin: Positive for color change. Negative for rash.  All other systems reviewed and are negative.   Per HPI unless specifically indicated above      Objective:    BP (!) 166/78   Pulse 67   Temp 97 F (36.1 C) (Oral)   Ht 5\' 10"  (1.778 m)   Wt 179 lb 3.2 oz (81.3 kg)   BMI 25.71 kg/m   Wt Readings from Last 3 Encounters:  03/13/16 179 lb 3.2 oz  (81.3 kg)  08/08/15 180 lb 3.2 oz (81.7 kg)  07/11/15 173 lb (78.5 kg)    Physical Exam  Constitutional: He is oriented to person, place, and time. He appears well-developed and well-nourished. No distress.  Eyes: Conjunctivae are normal. Right eye exhibits no discharge. Left eye exhibits no discharge. No scleral icterus.  Musculoskeletal: He exhibits no edema.       Left foot: There is decreased range of motion (Swelling inhibits range of motion), tenderness and swelling.       Feet:  Neurological: He is alert and oriented to person, place, and time. Coordination normal.  Skin: Skin is warm and dry. No rash noted. He is not diaphoretic.  Psychiatric: He has a normal mood and affect. His behavior is normal.  Nursing note and vitals reviewed.     Assessment & Plan:   Problem List Items Addressed This Visit    None    Visit Diagnoses    Acute gout of multiple sites, unspecified cause    -  Primary   Second time patient has had gout, he is on hydrochlorothiazide and discussed that with him. We'll send colchicine and give Depo-Medrol   Relevant Medications   methylPREDNISolone acetate (DEPO-MEDROL) injection 80 mg (Completed)   colchicine 0.6  MG tablet       Follow up plan: Return if symptoms worsen or fail to improve.  Counseling provided for all of the vaccine components No orders of the defined types were placed in this encounter.   Caryl Pina, MD [facility] 03/13/2016, 8:22 AM

## 2016-05-11 ENCOUNTER — Emergency Department (HOSPITAL_COMMUNITY)
Admission: EM | Admit: 2016-05-11 | Discharge: 2016-05-11 | Disposition: A | Discharge status: Home / Self Care | Payer: PPO | Attending: Emergency Medicine | Admitting: Emergency Medicine

## 2016-05-11 ENCOUNTER — Encounter (HOSPITAL_COMMUNITY): Payer: Self-pay | Admitting: Emergency Medicine

## 2016-05-11 DIAGNOSIS — I160 Hypertensive urgency: Secondary | ICD-10-CM | POA: Diagnosis not present

## 2016-05-11 DIAGNOSIS — I2510 Atherosclerotic heart disease of native coronary artery without angina pectoris: Secondary | ICD-10-CM | POA: Insufficient documentation

## 2016-05-11 DIAGNOSIS — Z79899 Other long term (current) drug therapy: Secondary | ICD-10-CM | POA: Insufficient documentation

## 2016-05-11 DIAGNOSIS — Z7982 Long term (current) use of aspirin: Secondary | ICD-10-CM | POA: Insufficient documentation

## 2016-05-11 DIAGNOSIS — I10 Essential (primary) hypertension: Secondary | ICD-10-CM | POA: Diagnosis not present

## 2016-05-11 DIAGNOSIS — Z955 Presence of coronary angioplasty implant and graft: Secondary | ICD-10-CM | POA: Insufficient documentation

## 2016-05-11 DIAGNOSIS — Z87891 Personal history of nicotine dependence: Secondary | ICD-10-CM | POA: Insufficient documentation

## 2016-05-11 HISTORY — DX: Gout, unspecified: M10.9

## 2016-05-11 LAB — CBC WITH DIFFERENTIAL/PLATELET
BASOS ABS: 0 10*3/uL (ref 0.0–0.1)
BASOS PCT: 0 %
EOS ABS: 0.2 10*3/uL (ref 0.0–0.7)
Eosinophils Relative: 3 %
HCT: 42.3 % (ref 39.0–52.0)
HEMOGLOBIN: 14.3 g/dL (ref 13.0–17.0)
Lymphocytes Relative: 26 %
Lymphs Abs: 1.2 10*3/uL (ref 0.7–4.0)
MCH: 30.3 pg (ref 26.0–34.0)
MCHC: 33.8 g/dL (ref 30.0–36.0)
MCV: 89.6 fL (ref 78.0–100.0)
Monocytes Absolute: 0.4 10*3/uL (ref 0.1–1.0)
Monocytes Relative: 7 %
NEUTROS PCT: 64 %
Neutro Abs: 3 10*3/uL (ref 1.7–7.7)
Platelets: 198 10*3/uL (ref 150–400)
RBC: 4.72 MIL/uL (ref 4.22–5.81)
RDW: 12.9 % (ref 11.5–15.5)
WBC: 4.7 10*3/uL (ref 4.0–10.5)

## 2016-05-11 LAB — BASIC METABOLIC PANEL
Anion gap: 5 (ref 5–15)
BUN: 10 mg/dL (ref 6–20)
CALCIUM: 8.6 mg/dL — AB (ref 8.9–10.3)
CO2: 30 mmol/L (ref 22–32)
CREATININE: 1.02 mg/dL (ref 0.61–1.24)
Chloride: 106 mmol/L (ref 101–111)
GFR calc non Af Amer: >60 mL/min (ref >60)
Glucose, Bld: 102 mg/dL — ABNORMAL HIGH (ref 65–99)
Potassium: 3.5 mmol/L (ref 3.5–5.1)
SODIUM: 141 mmol/L (ref 135–145)

## 2016-05-11 LAB — TROPONIN I

## 2016-05-11 MED ORDER — HYDROGEN PEROXIDE 3 % EX SOLN
CUTANEOUS | Status: AC
  Filled 2016-05-11: qty 473

## 2016-05-11 MED ORDER — AMLODIPINE BESYLATE 5 MG PO TABS: 5.0000 mg | ORAL_TABLET | Freq: Every day | ORAL | 0 refills | Status: DC

## 2016-05-11 NOTE — Discharge Instructions (Signed)
Begin taking Norvasc as prescribed.  Keep a record of your blood pressures over the next week and take this with you to your next doctor's office visit.  Return to the emergency department if you develop severe headache, chest pain, difficulty breathing, or other new and concerning symptoms.

## 2016-05-11 NOTE — ED Provider Notes (Signed)
[place] DEPT Provider Note   CSN: [id number] Arrival date & time: 05/11/16  0706     History   Chief Complaint Chief Complaint  Patient presents with  . Hypertension    HPI Manuel Barrett is a 71 y.o. male.  Patient is a 71 year old male with past medical history of hypertension and coronary artery disease. He presents here for evaluation of elevated blood pressure. He had recent changes made in his blood pressure medications by the [facility] hospital. It appears as though he is taking losartan, hydrochlorothiazide, and metoprolol. He has been checking his blood pressures over the past week since a recent appointment where his blood pressure was found to be elevated. He has been getting readings of over [id number] systolic. He called the [facility] and they told him to come to the nearest emergency department to be evaluated. He reports occasionally feeling short of breath and "jittery", but denies other complaints.   The history is provided by the patient.  Hypertension  This is a new problem. Episode onset: One week ago. The problem occurs constantly. The problem has not changed since onset.Pertinent negatives include no chest pain, no headaches and no shortness of breath. Nothing aggravates the symptoms. Nothing relieves the symptoms.    Past Medical History:  Diagnosis Date  . CAD in native artery   . GERD (gastroesophageal reflux disease)   . Gout   . Hypertension   . PAD (peripheral artery disease) ([place]) 1994    Patient Active Problem List   Diagnosis Date Noted  . CAD in native artery 03/28/2014  . Hyperlipidemia LDL goal <70 03/28/2014  . Hypertension     Past Surgical History:  Procedure Laterality Date  . AORTA - BILATERAL FEMORAL ARTERY BYPASS GRAFT  2001  . BACK SURGERY    . cardiac cath  03/14/14   non obstructive disease  . FEMORAL-POPLITEAL BYPASS GRAFT  1994   Dr. Amedeo Plenty  . I&D EXTREMITY Right 01/09/2015   Procedure: IRRIGATION AND DEBRIDEMENT RIGHT  THUMB/HAND ;  Surgeon: Roseanne Kaufman, MD;  Location: [place];  Service: Orthopedics;  Laterality: Right;  . LEFT HEART CATHETERIZATION WITH CORONARY ANGIOGRAM N/A 03/06/2014   Procedure: LEFT HEART CATHETERIZATION WITH CORONARY ANGIOGRAM;  Surgeon: Peter M Martinique, MD;  Location: [facility] CATH LAB;  Service: Cardiovascular;  Laterality: N/A;       Home Medications    Prior to Admission medications   Medication Sig Start Date End Date Taking? Authorizing Provider  acetaminophen (TYLENOL) 500 MG tablet Take 1,500 mg by mouth every 6 (six) hours as needed for moderate pain.    Historical Provider, MD  acidophilus (RISAQUAD) CAPS capsule Take 1 capsule by mouth daily.    Historical Provider, MD  aspirin 325 MG tablet Take 325 mg by mouth daily.    Historical Provider, MD  atorvastatin (LIPITOR) 40 MG tablet Take 1 tablet (40 mg total) by mouth daily at 6 PM. 08/08/15   Timmothy Euler, MD  colchicine 0.6 MG tablet Take 1 tablet (0.6 mg total) by mouth daily. 03/13/16   Fransisca Kaufmann Dettinger, MD  ibuprofen (ADVIL,MOTRIN) 200 MG tablet Take 400 mg by mouth every 6 (six) hours as needed for mild pain.    Historical Provider, MD  metoprolol (LOPRESSOR) 50 MG tablet Take 1 tablet (50 mg total) by mouth 2 (two) times daily. 08/08/15   Timmothy Euler, MD  naproxen (NAPROSYN) 500 MG tablet Take 1 tablet (500 mg total) by mouth 2 (two) times daily with  a meal. 06/11/14   [name], MD  nitroGLYCERIN (NITROSTAT) 0.4 MG SL tablet Place 1 tablet (0.4 mg total) under the tongue every 5 (five) minutes as needed for chest pain. 03/09/14   Peter M Martinique, MD  omeprazole (PRILOSEC) 20 MG capsule Take 1 capsule (20 mg total) by mouth daily as needed (acid reflux). 08/08/15   Timmothy Euler, MD  triamterene-hydrochlorothiazide (MAXZIDE-25) 37.5-25 MG tablet Take 1 tablet by mouth daily. 08/08/15   Timmothy Euler, MD    Family History Family History  Problem Relation Age of Onset  . Heart failure Father   . COPD  Father     Social History Social History  Substance Use Topics  . Smoking status: Former Smoker    Packs/day: 1.50    Years: 30.00    Types: Cigarettes    Quit date: 04/20/1992  . Smokeless tobacco: Never Used  . Alcohol use 0.0 oz/week     Comment: 1 beer occasionally      Allergies   Indomethacin   Review of Systems Review of Systems  Respiratory: Negative for shortness of breath.   Cardiovascular: Negative for chest pain.  Neurological: Negative for headaches.  All other systems reviewed and are negative.    Physical Exam Updated Vital Signs BP (!) 204/88   Pulse (!) 51   Temp 97.8 F (36.6 C)   Resp 18   Ht 5\' 10"  (1.778 m)   Wt 187 lb (84.8 kg)   SpO2 98%   BMI 26.83 kg/m   Physical Exam  Constitutional: He is oriented to person, place, and time. He appears well-developed and well-nourished. No distress.  HENT:  Head: Normocephalic and atraumatic.  Mouth/Throat: Oropharynx is clear and moist.  Eyes: EOM are normal. Pupils are equal, round, and reactive to light.  Neck: Normal range of motion. Neck supple.  Cardiovascular: Normal rate and regular rhythm.  Exam reveals no friction rub.   No murmur heard. Pulmonary/Chest: Effort normal and breath sounds normal. No respiratory distress. He has no wheezes. He has no rales.  Abdominal: Soft. Bowel sounds are normal. He exhibits no distension. There is no tenderness.  Musculoskeletal: Normal range of motion. He exhibits no edema.  Neurological: He is alert and oriented to person, place, and time. No cranial nerve deficit. Coordination normal.  Skin: Skin is warm and dry. He is not diaphoretic.  Nursing note and vitals reviewed.    ED Treatments / Results  Labs (all labs ordered are listed, but only abnormal results are displayed) Labs Reviewed  BASIC METABOLIC PANEL  CBC WITH DIFFERENTIAL/PLATELET  TROPONIN I    EKG  EKG Interpretation None       Radiology No results  found.  Procedures Procedures (including critical care time)  Medications Ordered in ED Medications - No data to display   Initial Impression / Assessment and Plan / ED Course  I have reviewed the triage vital signs and the nursing notes.  Pertinent labs & imaging results that were available during my care of the patient were reviewed by me and considered in my medical decision making (see chart for details).     Patient instructed to present here for evaluation of elevated blood pressure. He had some recent changes in his blood pressure medications his blood pressures been running over 200 in the morning. It is [id number] systolic here today in the ED. His laboratory studies and EKG reveal no evidence for endorgan damage. He is neurologically intact and nothing today appears  acute. I will add a low dose of Norvasc to his blood pressure regimen and advise him to keep an eye on his blood pressures for the next several days. He is to follow-up with his primary Dr. in the [place] system in the next 1-2 weeks for a recheck and return to the ER if he develops any new and concerning symptoms.  Final Clinical Impressions(s) / ED Diagnoses   Final diagnoses:  None    New Prescriptions New Prescriptions   No medications on file     Veryl Speak, MD 05/11/16 [phone]

## 2016-05-11 NOTE — ED Triage Notes (Signed)
Pt c/o elevated bp. Reports ha and "feeling anxious". Pt reports recent change in bp medication x 1.5 weeks ago. Denies cp. Nad noted.

## 2016-08-12 ENCOUNTER — Ambulatory Visit (INDEPENDENT_AMBULATORY_CARE_PROVIDER_SITE_OTHER): Payer: PPO

## 2016-08-12 ENCOUNTER — Ambulatory Visit (INDEPENDENT_AMBULATORY_CARE_PROVIDER_SITE_OTHER): Payer: PPO | Admitting: Family Medicine

## 2016-08-12 ENCOUNTER — Encounter: Payer: Self-pay | Admitting: Family Medicine

## 2016-08-12 ENCOUNTER — Encounter (INDEPENDENT_AMBULATORY_CARE_PROVIDER_SITE_OTHER): Payer: Self-pay

## 2016-08-12 VITALS — BP 143/88 | HR 88 | Temp 96.7°F | Ht 70.0 in | Wt 179.8 lb

## 2016-08-12 DIAGNOSIS — M79672 Pain in left foot: Secondary | ICD-10-CM

## 2016-08-12 DIAGNOSIS — M109 Gout, unspecified: Secondary | ICD-10-CM | POA: Diagnosis not present

## 2016-08-12 MED ORDER — COLCHICINE 0.6 MG PO TABS: 0.6000 mg | ORAL_TABLET | Freq: Every day | ORAL | 1 refills | Status: DC

## 2016-08-12 NOTE — Patient Instructions (Signed)
Great to see you!  For today:  Take 2 pills of colchicine right away then 1 more pill 1 hour later ( this may cause some loose stools)  Continue taking allopurinol 1 pill once daily  Continue taking colchicine for the first 2 months or so while starting allopurinol

## 2016-08-12 NOTE — Progress Notes (Signed)
   HPI  Patient presents today with concern of gout.  Patient's lines and she's had left foot pain more severe than previous gout flares for the last 2 weeks.  He restarted allopurinol and stop colchicine after he began having pain.  [facility] He needs refills colchicine.  Denies any injury or concern of injury for the left foot.  PMH: Smoking status noted ROS: Per HPI  Objective: BP (!) 143/88   Pulse 88   Temp (!) 96.7 F (35.9 C) (Oral)   Ht 5\' 10"  (1.778 m)   Wt 179 lb 12.8 oz (81.6 kg)   BMI 25.80 kg/m  Gen: NAD, alert, cooperative with exam HEENT: NCAT CV: RRR, good S1/S2, no murmur Resp: CTABL, no wheezes, non-labored Ext: No edema, warm Neuro: Alert and oriented, No gross deficits  SK Left foot with purple discoloration tenderness at the first MTP with mild swelling  Assessment and plan:  # Acute gout flare Acute gout flare the first MTP, likely only gout, however during purple discoloration of recommended an x-ray to rule out fracture. Discussed colchicine dosing, does for flare for one day, then continue daily for 2 months while starting allopurinol. Continue allopurinol    Orders Placed This Encounter  Procedures  . DG Foot Complete Left    Standing Status:   Future    Standing Expiration Date:   10/12/2017    Order Specific Question:   Reason for Exam (SYMPTOM  OR DIAGNOSIS REQUIRED)    Answer:   L foot pain, likely only gout but r/o Fx with bruising    Order Specific Question:   Preferred imaging location?    Answer:   Internal    Order Specific Question:   Radiology Contrast Protocol - do NOT remove file path    Answer:   \\[email or web address].[email or web address]    Meds ordered this encounter  Medications  . allopurinol (ZYLOPRIM) 100 MG tablet    Sig: Take 100 mg by mouth daily.  . colchicine 0.6 MG tablet    Sig: Take 1 tablet (0.6 mg total) by mouth daily.    Dispense:  30 tablet      Refill:  [place], MD [facility] 08/12/2016, 9:10 AM

## 2016-09-03 ENCOUNTER — Encounter: Payer: Self-pay | Admitting: Family Medicine

## 2016-09-03 ENCOUNTER — Ambulatory Visit (INDEPENDENT_AMBULATORY_CARE_PROVIDER_SITE_OTHER): Payer: PPO | Admitting: Family Medicine

## 2016-09-03 VITALS — BP 130/69 | HR 61 | Temp 97.0°F | Ht 70.0 in | Wt 177.8 lb

## 2016-09-03 DIAGNOSIS — M75102 Unspecified rotator cuff tear or rupture of left shoulder, not specified as traumatic: Secondary | ICD-10-CM

## 2016-09-03 MED ORDER — TRIAMCINOLONE ACETONIDE 40 MG/ML IJ SUSP
40.0000 mg | Freq: Once | INTRAMUSCULAR | Status: AC
  Administered 2016-09-03: 40 mg via INTRAMUSCULAR

## 2016-09-03 MED ORDER — LIDOCAINE HCL 1 % IJ SOLN
10.0000 mL | Freq: Once | INTRAMUSCULAR | Status: AC
  Administered 2016-09-03: 10 mL via INTRADERMAL

## 2016-09-03 NOTE — Progress Notes (Signed)
   HPI  Patient presents today with left shoulder pain.  Patient denies any injury but states his left shoulder began to bother him about 2 weeks ago. He has limited range of motion. He was seen with the [facility] for routine care and had an x-ray that showed before meals joint degenerative changes and a history of cervical spine surgery.  Patient states this difficult to raise his arm above 90. He does have some locking symptoms and he describes his pain is sharp and at times very intense located along the left deltoid.  PMH: Smoking status noted ROS: Per HPI  Objective: BP 130/69   Pulse 61   Temp 97 F (36.1 C) (Oral)   Ht 5\' 10"  (1.778 m)   Wt 177 lb 12.8 oz (80.6 kg)   BMI 25.51 kg/m  Gen: NAD, alert, cooperative with exam HEENT: NCAT CV: RRR, good S1/S2, no murmur Resp: CTABL, no wheezes, non-labored Ext: No edema, warm Neuro: Alert and oriented, No gross deficits  MSK: Limited Range of motion with for flexion and abduction limited to 90 on the left Positive Hawkins and empty can test.  Shoulder injection- L  Area cleaned with iodine x 2  and wiped clear with alcohol swab.  Using 21 1/2 gauge needle 1 cc Kenalog and 3 cc's 1% Lidocaine were injected in subacromial space via posterior approach.  Sterile bandage placed.  Patient tolerated procedure well.  No complications.     Assessment and plan:  # Rotator cuff syndrome left shoulder Treated with subacromial steroid injection today, start physical therapy. X-ray results from the [place] show before meals joint degenerative changes which are not likely the etiology of his pain. 3 days out of work as he is a very physical job. Return to clinic as needed   Laroy Apple, MD [facility] 09/03/2016, 3:07 PM

## 2016-09-03 NOTE — Addendum Note (Signed)
Addended by: Nigel Berthold C on: 09/03/2016 03:19 PM   Modules accepted: Orders

## 2016-09-08 ENCOUNTER — Ambulatory Visit: Payer: PPO | Attending: Family Medicine | Admitting: Physical Therapy

## 2016-09-08 ENCOUNTER — Telehealth: Payer: Self-pay | Admitting: Family Medicine

## 2016-09-08 DIAGNOSIS — Z0289 Encounter for other administrative examinations: Secondary | ICD-10-CM

## 2016-09-08 DIAGNOSIS — M25512 Pain in left shoulder: Secondary | ICD-10-CM | POA: Insufficient documentation

## 2016-09-08 DIAGNOSIS — M25612 Stiffness of left shoulder, not elsewhere classified: Secondary | ICD-10-CM | POA: Insufficient documentation

## 2016-09-08 NOTE — Telephone Encounter (Signed)
Letter written  Laroy Apple, MD [facility] 09/08/2016, 12:15 PM

## 2016-09-08 NOTE — Therapy (Signed)
[facility] [place], [place], [address] Phone: [phone]   Fax:  [phone]  Physical Therapy Evaluation  Patient Details  Name: Manuel Barrett MRN: [id number] Date of Birth: 1946-03-09 Referring Provider: Kenn File MD.  Encounter Date: 09/08/2016      PT End of Session - 09/08/16 1258    Visit Number 1   Number of Visits 12   Date for PT Re-Evaluation 10/20/16   PT Start Time 0901   PT Stop Time 0948   PT Time Calculation (min) 47 min   Activity Tolerance Patient tolerated treatment well   Behavior During Therapy [facility] for tasks assessed/performed      Past Medical History:  Diagnosis Date  . CAD in native artery   . GERD (gastroesophageal reflux disease)   . Gout   . Hypertension   . PAD (peripheral artery disease) ([place]) 1994    Past Surgical History:  Procedure Laterality Date  . AORTA - BILATERAL FEMORAL ARTERY BYPASS GRAFT  2001  . BACK SURGERY    . cardiac cath  03/14/14   non obstructive disease  . FEMORAL-POPLITEAL BYPASS GRAFT  1994   Dr. Amedeo Plenty  . I&D EXTREMITY Right 01/09/2015   Procedure: IRRIGATION AND DEBRIDEMENT RIGHT THUMB/HAND ;  Surgeon: Roseanne Kaufman, MD;  Location: [facility];  Service: Orthopedics;  Laterality: Right;  . LEFT HEART CATHETERIZATION WITH CORONARY ANGIOGRAM N/A 03/06/2014   Procedure: LEFT HEART CATHETERIZATION WITH CORONARY ANGIOGRAM;  Surgeon: Peter M Martinique, MD;  Location: [facility] C[facility] LAB;  Service: Cardiovascular;  Laterality: N/A;    There were no vitals filed for this visit.       Subjective Assessment - 09/08/16 1259    Pertinent History Neck surgery.            [facility] PT Assessment - 09/08/16 0001      Assessment   Medical Diagnosis Rotator cuff syndrome of left shoulder.   Referring Provider Kenn File MD.     Precautions   Precautions None     Restrictions   Weight Bearing Restrictions No     Balance Screen   Has the patient fallen in the  past 6 months No   Has the patient had a decrease in activity level because of a fear of falling?  No   Is the patient reluctant to leave their home because of a fear of falling?  No     Prior Function   Level of Independence Independent     Posture/Postural Control   Posture/Postural Control Postural limitations   Postural Limitations Rounded Shoulders;Forward head     ROM / Strength   AROM / PROM / Strength AROM;Strength     AROM   Overall AROM Comments Active left shoulder flexion to 150 degrees (improved since injection); behind back to SIJ; ER= 73 degrees.     Strength   Overall Strength Comments left shoulder ER= 4 to 4+/5.     Palpation   Palpation comment tender at left acromial ridge.  Referred pain into left middle deltoid region     Special Tests    Special Tests Rotator Cuff Impingement  Normal UE DTR's.   Rotator Cuff Impingment tests Neer impingement test;Hawkins- Kennedy test     Neer Impingement test    Findings Positive   Side Left     Ambulation/Gait   Gait Comments WNL.  Plan - 09/08/16 1253    Clinical Impression Statement The patient presents to OPPT with c/o left shoulder over the last 2 weeks+.  He works as a Estate manager/land agent at [employer], clinical and is considering asking his MD if he could be allowed to be written out of work for 2 weeks to get his shoulder better.  He has limited range of motion and demonstrates a positive Impingement test on the left.  His pain limits his ability to perform ADL's and work activities.  Patient will benefit from skilled PT.   Rehab Potential Excellent   PT Frequency 2x / week   PT Duration 6 weeks   PT Treatment/Interventions ADLs/Self Care Home Management;Cryotherapy;Electrical Stimulation;Moist Heat;Ultrasound;Therapeutic exercise;Therapeutic activities;Manual techniques;Passive range of motion;Dry needling   PT Next Visit Plan Modalites and STW/M to  left shoulder; RW4; SDLY ER   Consulted and Agree with Plan of Care Patient      Patient will benefit from skilled therapeutic intervention in order to improve the following deficits and impairments:  Decreased activity tolerance, Decreased strength, Decreased range of motion, Pain  Visit Diagnosis: Acute pain of left shoulder - Plan: PT plan of care cert/re-cert  Stiffness of left shoulder, not elsewhere classified - Plan: PT plan of care cert/re-cert      G-Codes - 73/53/29 1257    Functional Assessment Tool Used (Outpatient Only) FOTO...39% limitation.   Functional Limitation Self care   Self Care Current Status [phone]) At least 20 percent but less than 40 percent impaired, limited or restricted   Self Care Goal Status (S3419) At least 1 percent but less than 20 percent impaired, limited or restricted       Problem List Patient Active Problem List   Diagnosis Date Noted  . CAD in native artery 03/28/2014  . Hyperlipidemia LDL goal <70 03/28/2014  . Hypertension     Manuel Barrett, [place] MPT 09/08/2016, 1:48 PM  [facility] [address] [place], [place], [address] Phone: [phone]   Fax:  [phone]  Name: Manuel Barrett MRN: [id number] Date of Birth: 1946/01/29

## 2016-09-09 ENCOUNTER — Encounter: Payer: Self-pay | Admitting: Physical Therapy

## 2016-09-09 ENCOUNTER — Ambulatory Visit: Payer: PPO | Admitting: Physical Therapy

## 2016-09-09 DIAGNOSIS — M25512 Pain in left shoulder: Secondary | ICD-10-CM | POA: Diagnosis not present

## 2016-09-09 DIAGNOSIS — M25612 Stiffness of left shoulder, not elsewhere classified: Secondary | ICD-10-CM

## 2016-09-09 NOTE — Therapy (Signed)
[facility] [place], [place], [address] Phone: [phone]   Fax:  [phone]  Physical Therapy Treatment  Patient Details  Name: Manuel Barrett MRN: [id number] Date of Birth: 01-Sep-1945 Referring Provider: Kenn File MD.  Encounter Date: 09/09/2016      PT End of Session - 09/09/16 1306    Visit Number 2   Number of Visits 12   Date for PT Re-Evaluation 10/20/16   PT Start Time 6606   PT Stop Time 1353   PT Time Calculation (min) 50 min   Activity Tolerance Patient tolerated treatment well   Behavior During Therapy [facility] for tasks assessed/performed      Past Medical History:  Diagnosis Date  . CAD in native artery   . GERD (gastroesophageal reflux disease)   . Gout   . Hypertension   . PAD (peripheral artery disease) ([place]) 1994    Past Surgical History:  Procedure Laterality Date  . AORTA - BILATERAL FEMORAL ARTERY BYPASS GRAFT  2001  . BACK SURGERY    . cardiac cath  03/14/14   non obstructive disease  . FEMORAL-POPLITEAL BYPASS GRAFT  1994   Dr. Amedeo Plenty  . I&D EXTREMITY Right 01/09/2015   Procedure: IRRIGATION AND DEBRIDEMENT RIGHT THUMB/HAND ;  Surgeon: Roseanne Kaufman, MD;  Location: [facility];  Service: Orthopedics;  Laterality: Right;  . LEFT HEART CATHETERIZATION WITH CORONARY ANGIOGRAM N/A 03/06/2014   Procedure: LEFT HEART CATHETERIZATION WITH CORONARY ANGIOGRAM;  Surgeon: Peter M Martinique, MD;  Location: [facility] CATH LAB;  Service: Cardiovascular;  Laterality: N/A;    There were no vitals filed for this visit.      Subjective Assessment - 09/09/16 1304    Subjective Reports that he cannot reach behind his back and that his shoulder has ached following electrical stimulation.   Pertinent History Neck surgery.   Patient Stated Goals Get out of pain.   Currently in Pain? Yes   Pain Score --  No pain score reported by patient today   Pain Location Shoulder   Pain Orientation Left   Pain Descriptors /  Indicators Discomfort   Pain Type Acute pain   Pain Onset 1 to 4 weeks ago   Aggravating Factors  Shoulder protraction            [facility] PT Assessment - 09/09/16 0001      Assessment   Medical Diagnosis Rotator cuff syndrome of left shoulder.   Next MD Visit None     Precautions   Precautions None     Restrictions   Weight Bearing Restrictions No                     [facility] Adult PT Treatment/Exercise - 09/09/16 0001      Exercises   Exercises Shoulder     Shoulder Exercises: Sidelying   External Rotation Strengthening;Left;20 reps;Weights   External Rotation Weight (lbs) 2     Shoulder Exercises: Standing   Protraction Strengthening;Left;20 reps;Theraband   Theraband Level (Shoulder Protraction) Level 1 (Yellow)   External Rotation Strengthening;Left;20 reps;Theraband   Theraband Level (Shoulder External Rotation) Level 1 (Yellow)   Internal Rotation Strengthening;Left;20 reps;Theraband   Theraband Level (Shoulder Internal Rotation) Level 1 (Yellow)   Flexion Strengthening;Both;20 reps;Weights   Shoulder Flexion Weight (lbs) 2   Extension Strengthening;Left;20 reps;Theraband   Theraband Level (Shoulder Extension) Level 1 (Yellow)   Row Strengthening;Left;20 reps;Theraband   Theraband Level (Shoulder Row) Level 1 (Yellow)     Shoulder  Exercises: ROM/Strengthening   UBE (Upper Arm Bike) UBE 120 RPM x5 min   Other ROM/Strengthening Exercises LUE wall slides in ER x20 reps     Modalities   Modalities Electrical Stimulation;Moist Heat     Moist Heat Therapy   Number Minutes Moist Heat 15 Minutes   Moist Heat Location Shoulder     Electrical Stimulation   Electrical Stimulation Location L shoulder   Electrical Stimulation Action IFC   Electrical Stimulation Parameters 80-150 hz x15 min   Electrical Stimulation Goals Pain;Tone     Manual Therapy   Manual Therapy Soft tissue mobilization   Soft tissue mobilization STW/MFR to L deltoids to reduce  tone and soreness                            Plan - 09/09/16 1438    Clinical Impression Statement Patient presented in clinic with reports of L shoulder discomfort intermittantly but reported as tolerable. Patient guided through strengthening exercises with no pain reported until end of reps. Patient presented with increased tone throughout L deltoids and lateral Pectoralis with moderate release following manual therapy. Normal modalities response noted following removal of the modalities.   Rehab Potential Excellent   PT Frequency 2x / week   PT Duration 6 weeks   PT Treatment/Interventions ADLs/Self Care Home Management;Cryotherapy;Electrical Stimulation;Moist Heat;Ultrasound;Therapeutic exercise;Therapeutic activities;Manual techniques;Passive range of motion;Dry needling   PT Next Visit Plan Continue with L shoulder strengthening with manual therapy and modalities per MPT POC.   Consulted and Agree with Plan of Care Patient      Patient will benefit from skilled therapeutic intervention in order to improve the following deficits and impairments:  Decreased activity tolerance, Decreased strength, Decreased range of motion, Pain  Visit Diagnosis: Acute pain of left shoulder  Stiffness of left shoulder, not elsewhere classified       G-Codes - 2016/09/13 1257    Functional Assessment Tool Used (Outpatient Only) FOTO...39% limitation.   Functional Limitation Self care   Self Care Current Status [phone]) At least 20 percent but less than 40 percent impaired, limited or restricted   Self Care Goal Status (S5053) At least 1 percent but less than 20 percent impaired, limited or restricted      Problem List Patient Active Problem List   Diagnosis Date Noted  . CAD in native artery 03/28/2014  . Hyperlipidemia LDL goal <70 03/28/2014  . Hypertension     Wynelle Fanny, PTA 09/09/2016, 2:44 PM  [facility] [address] [place], [place], [address] Phone: [phone]   Fax:  [phone]  Name: Manuel Barrett MRN: [id number] Date of Birth: [birth year]

## 2016-09-15 ENCOUNTER — Ambulatory Visit: Payer: PPO | Admitting: Physical Therapy

## 2016-09-15 DIAGNOSIS — M25512 Pain in left shoulder: Secondary | ICD-10-CM

## 2016-09-15 DIAGNOSIS — M25612 Stiffness of left shoulder, not elsewhere classified: Secondary | ICD-10-CM

## 2016-09-15 NOTE — Patient Instructions (Signed)
Trigger Point Dry Needling  . What is Trigger Point Dry Needling (DN)? o DN is a physical therapy technique used to treat muscle pain and dysfunction. Specifically, DN helps deactivate muscle trigger points (muscle knots).  o A thin filiform needle is used to penetrate the skin and stimulate the underlying trigger point. The goal is for a local twitch response (LTR) to occur and for the trigger point to relax. No medication of any kind is injected during the procedure.   . What Does Trigger Point Dry Needling Feel Like?  o The procedure feels different for each individual patient. Some patients report that they do not actually feel the needle enter the skin and overall the process is not painful. Very mild bleeding may occur. However, many patients feel a deep cramping in the muscle in which the needle was inserted. This is the local twitch response.   Marland Kitchen How Will I feel after the treatment? o Soreness is normal, and the onset of soreness may not occur for a few hours. Typically this soreness does not last longer than two days.  o Bruising is uncommon, however; ice can be used to decrease any possible bruising.  o In rare cases feeling tired or nauseous after the treatment is normal. In addition, your symptoms may get worse before they get better, this period will typically not last longer than 24 hours.   . What Can I do After My Treatment? o Increase your hydration by drinking more water for the next 24 hours. o You may place ice or heat on the areas treated that have become sore, however, do not use heat on inflamed or bruised areas. Heat often brings more relief post needling. o You can continue your regular activities, but vigorous activity is not recommended initially after the treatment for 24 hours. o DN is best combined with other physical therapy such as strengthening, stretching, and other therapies.    Precautions:  In some cases, dry needling is done over the lung field. While rare,  there is a risk of pneumothorax (punctured lung). Because of this, if you ever experience shortness of breath on exertion, difficulty taking a deep breath, chest pain or a dry cough following dry needling, you should report to an emergency room and tell them that you have been dry needled over the thorax.  Madelyn Flavors, PT 09/15/16 9:06 AM [facility] [place], [place], [address] Phone: [phone]   Fax:  [phone] Scapula Adduction With Pectorals, Mid-Range

## 2016-09-15 NOTE — Therapy (Signed)
[facility] [place], [place], [address] Phone: [phone]   Fax:  [phone]  Physical Therapy Treatment  Patient Details  Name: Manuel Barrett MRN: [id number] Date of Birth: 1946/01/17 Referring Provider: Kenn File MD.  Encounter Date: 09/15/2016      PT End of Session - 09/15/16 0814    Visit Number 3   Number of Visits 12   Date for PT Re-Evaluation 10/20/16   PT Start Time 0815   PT Stop Time 0915   PT Time Calculation (min) 60 min   Activity Tolerance Patient tolerated treatment well   Behavior During Therapy [facility] for tasks assessed/performed      Past Medical History:  Diagnosis Date  . CAD in native artery   . GERD (gastroesophageal reflux disease)   . Gout   . Hypertension   . PAD (peripheral artery disease) ([place]) 1994    Past Surgical History:  Procedure Laterality Date  . AORTA - BILATERAL FEMORAL ARTERY BYPASS GRAFT  2001  . BACK SURGERY    . cardiac cath  03/14/14   non obstructive disease  . FEMORAL-POPLITEAL BYPASS GRAFT  1994   Dr. Amedeo Plenty  . I&D EXTREMITY Right 01/09/2015   Procedure: IRRIGATION AND DEBRIDEMENT RIGHT THUMB/HAND ;  Surgeon: Roseanne Kaufman, MD;  Location: [place];  Service: Orthopedics;  Laterality: Right;  . LEFT HEART CATHETERIZATION WITH CORONARY ANGIOGRAM N/A 03/06/2014   Procedure: LEFT HEART CATHETERIZATION WITH CORONARY ANGIOGRAM;  Surgeon: Peter M Martinique, MD;  Location: [facility] CATH LAB;  Service: Cardiovascular;  Laterality: N/A;    There were no vitals filed for this visit.      Subjective Assessment - 09/15/16 0818    Subjective Patient states his pain is 9/10 if he tries to use his left arm. "Sometimes I can't even pick up a drink". No pain with arm at rest or when using arm below elbow. Unable to reach behind back.   Pertinent History Neck surgery.   Patient Stated Goals Get out of pain.   Currently in Pain? Yes   Pain Score 9    Pain Location Shoulder   Pain  Orientation Left   Pain Descriptors / Indicators Dull   Pain Type Acute pain   Pain Onset 1 to 4 weeks ago   Pain Frequency Constant                         [facility] Adult PT Treatment/Exercise - 09/15/16 0001      Shoulder Exercises: Seated   Retraction Both;20 reps   External Rotation Strengthening;Both;20 reps   External Rotation Limitations Robber     Shoulder Exercises: ROM/Strengthening   UBE (Upper Arm Bike) UBE 120 RPM x5 min     Shoulder Exercises: Stretch   External Rotation Stretch 2 reps;30 seconds  standing at door with body rotation     Modalities   Modalities Chiropractor Stimulation Location L shoulder   Electrical Stimulation Action IFC   Electrical Stimulation Parameters 80-150 Hz x 15 min   Electrical Stimulation Goals Pain     Manual Therapy   Manual Therapy Soft tissue mobilization;Myofascial release   Soft tissue mobilization to L deltoids, pectorals, subscap and post cuff   Myofascial Release to subscap with AA IR/ER                PT Education - 09/15/16 1613    Education  provided Yes   Education Details DN education and HEP; self care: MFR with tennis ball   Person(s) Educated Patient   Methods Explanation;Demonstration;Handout;Verbal cues;Tactile cues  hand drew stretch for pecs   Comprehension Verbalized understanding;Returned demonstration             PT Long Term Goals - 09/10/16 1032      PT LONG TERM GOAL #1   Title Independent with a HEP.   Time 6   Period Weeks   Status New     PT LONG TERM GOAL #2   Title Perform ADL's with pain not > 2-3/10.   Time 6   Period Weeks   Status New               Plan - 09/15/16 1614    Clinical Impression Statement Patient tolerated STW fairly well however he would benefit from DN in pecs, deltoids and posterior cuff.    Rehab Potential Excellent   PT Frequency 2x / week   PT Treatment/Interventions  ADLs/Self Care Home Management;Cryotherapy;Electrical Stimulation;Moist Heat;Ultrasound;Therapeutic exercise;Therapeutic activities;Manual techniques;Passive range of motion;Dry needling   PT Next Visit Plan Continue with L shoulder strengthening with manual therapy and modalities per MPT POC.      Patient will benefit from skilled therapeutic intervention in order to improve the following deficits and impairments:  Decreased activity tolerance, Decreased strength, Decreased range of motion, Pain  Visit Diagnosis: Acute pain of left shoulder  Stiffness of left shoulder, not elsewhere classified     Problem List Patient Active Problem List   Diagnosis Date Noted  . CAD in native artery 03/28/2014  . Hyperlipidemia LDL goal <70 03/28/2014  . Hypertension     Madelyn Flavors PT 09/15/2016, 4:21 PM  [facility] [address] [place], [place], [address] Phone: [phone]   Fax:  [phone]  Name: Manuel Barrett MRN: [id number] Date of Birth: 1945/12/12

## 2016-09-17 ENCOUNTER — Encounter: Payer: Self-pay | Admitting: Physical Therapy

## 2016-09-17 ENCOUNTER — Ambulatory Visit: Payer: PPO | Admitting: Physical Therapy

## 2016-09-17 DIAGNOSIS — M25512 Pain in left shoulder: Secondary | ICD-10-CM

## 2016-09-17 DIAGNOSIS — M25612 Stiffness of left shoulder, not elsewhere classified: Secondary | ICD-10-CM

## 2016-09-17 NOTE — Patient Instructions (Signed)
  Strengthening: Resisted Flexion   Hold tubing with left arm at side. Pull forward and up. Move shoulder through pain-free range of motion. Repeat __10__ times per set. Do _2-3___ sets per session. Do _2-3___ sessions per day.  Strengthening: Resisted Extension   Hold tubing in left hand, arm forward. Pull arm back, elbow straight. Repeat __10__ times per set. Do _2-3___ sets per session. Do _2-3___ sessions per day.   Strengthening: Resisted Internal Rotation   Hold tubing in left hand, elbow at side and forearm out. Rotate forearm in across body. Repeat __10__ times per set. Do _2-3___ sets per session. Do _2-3___ sessions per day.   Strengthening: Resisted External Rotation   Hold tubing in left hand, elbow at side and forearm across body. Rotate forearm out. Repeat __10__ times per set. Do __2-3__ sets per session. Do ____ sessions per day.    

## 2016-09-17 NOTE — Therapy (Signed)
[facility] [place], [place], [address] Phone: [phone]   Fax:  [phone]  Physical Therapy Treatment  Patient Details  Name: Manuel Barrett MRN: [id number] Date of Birth: November 20, 1945 Referring Provider: Kenn File MD.  Encounter Date: 09/17/2016      PT End of Session - 09/17/16 0855    Visit Number 4   Number of Visits 12   Date for PT Re-Evaluation 10/20/16   PT Start Time 0814   PT Stop Time 0913   PT Time Calculation (min) 59 min   Activity Tolerance Patient tolerated treatment well   Behavior During Therapy [facility] for tasks assessed/performed      Past Medical History:  Diagnosis Date  . CAD in native artery   . GERD (gastroesophageal reflux disease)   . Gout   . Hypertension   . PAD (peripheral artery disease) ([place]) 1994    Past Surgical History:  Procedure Laterality Date  . AORTA - BILATERAL FEMORAL ARTERY BYPASS GRAFT  2001  . BACK SURGERY    . cardiac cath  03/14/14   non obstructive disease  . FEMORAL-POPLITEAL BYPASS GRAFT  1994   Dr. Amedeo Plenty  . I&D EXTREMITY Right 01/09/2015   Procedure: IRRIGATION AND DEBRIDEMENT RIGHT THUMB/HAND ;  Surgeon: Roseanne Kaufman, MD;  Location: [place];  Service: Orthopedics;  Laterality: Right;  . LEFT HEART CATHETERIZATION WITH CORONARY ANGIOGRAM N/A 03/06/2014   Procedure: LEFT HEART CATHETERIZATION WITH CORONARY ANGIOGRAM;  Surgeon: Peter M Martinique, MD;  Location: [facility] CATH LAB;  Service: Cardiovascular;  Laterality: N/A;    There were no vitals filed for this visit.      Subjective Assessment - 09/17/16 0817    Subjective Patient reported improvement yet ongoing pain with certain movements. 0 at rest and up to 8-9/10 at times.   Pertinent History Neck surgery.   Patient Stated Goals Get out of pain.   Currently in Pain? Yes   Pain Location Shoulder   Pain Orientation Left   Pain Descriptors / Indicators Dull;Sharp   Pain Type Acute pain   Pain Onset 1  to 4 weeks ago   Pain Frequency Intermittent   Aggravating Factors  certain movements   Pain Relieving Factors at rest                         [facility] Adult PT Treatment/Exercise - 09/17/16 0001      Shoulder Exercises: Sidelying   External Rotation Strengthening;Left;Weights  3x10   External Rotation Weight (lbs) 2     Shoulder Exercises: Standing   Protraction Strengthening;Left;Theraband  x30   Theraband Level (Shoulder Protraction) Level 1 (Yellow)   External Rotation Strengthening;Left;Theraband  x30   Theraband Level (Shoulder External Rotation) Level 1 (Yellow)   Internal Rotation Strengthening;Left;Theraband  x30   Theraband Level (Shoulder Internal Rotation) Level 1 (Yellow)   Flexion Strengthening;Left;Weights  3x10   Shoulder Flexion Weight (lbs) 2   Extension Strengthening;Left;Theraband  x30   Theraband Level (Shoulder Extension) Level 1 (Yellow)   Row Strengthening;Left;Theraband  x30   Theraband Level (Shoulder Row) Level 1 (Yellow)     Shoulder Exercises: ROM/Strengthening   UBE (Upper Arm Bike) UBE 120 RPM x6 min     Moist Heat Therapy   Number Minutes Moist Heat 15 Minutes   Moist Heat Location Shoulder     Electrical Stimulation   Electrical Stimulation Location L shoulder   Electrical Stimulation Action IFC   Electrical Stimulation  Parameters 80-_0  x69mn   Electrical Stimulation Goals Pain     Manual Therapy   Manual Therapy Soft tissue mobilization;Myofascial release   Soft tissue mobilization to L deltoids, pectorals, subscap and post cuff                PT Education - 09/17/16 0900    Education provided Yes   Education Details HEP   Person(s) Educated Patient   Methods Explanation;Demonstration;Handout   Comprehension Verbalized understanding;Returned demonstration             PT Long Term Goals - 09/17/16 0840      PT LONG TERM GOAL #1   Title Independent with a HEP.   Time 6   Period Weeks    Status Achieved     PT LONG TERM GOAL #2   Title Perform ADL's with pain not > 2-3/10.   Time 6   Period Weeks   Status On-going               Plan - 09/17/16 08346   Clinical Impression Statement Patient tolerated treatment well today with some pain increase with certain movements. Patient able to complete exercises with cues for technique to avoid pain zone, Patient unable to complete ADL's and behind back reaching due to pain.  Patient given HEP for shoulder strengthening. Patient will be out of town next week. Patient will do all exercises as instucted. Patient met LTG#1 others ongoing due to pain defict.   Rehab Potential Excellent   PT Frequency 2x / week   PT Duration 6 weeks   PT Treatment/Interventions ADLs/Self Care Home Management;Cryotherapy;Electrical Stimulation;Moist Heat;Ultrasound;Therapeutic exercise;Therapeutic activities;Manual techniques;Passive range of motion;Dry needling   PT Next Visit Plan Continue with L shoulder strengthening with manual therapy and modalities per MPT POC.   Consulted and Agree with Plan of Care Patient      Patient will benefit from skilled therapeutic intervention in order to improve the following deficits and impairments:  Decreased activity tolerance, Decreased strength, Decreased range of motion, Pain  Visit Diagnosis: Acute pain of left shoulder  Stiffness of left shoulder, not elsewhere classified     Problem List Patient Active Problem List   Diagnosis Date Noted  . CAD in native artery 03/28/2014  . Hyperlipidemia LDL goal <70 03/28/2014  . Hypertension      **Patient hoping he can be written out of work for a longer period of time to rehab his shoulder.   CLadean Raya PTA 09/17/16 9:15 AM CMaliApplegate MPT [facility] [address]Phone: [phone]  Fax:  [phone] Name: Manuel DOOLYMRN: [id number] of Birth:  [birth year]

## 2016-09-18 ENCOUNTER — Telehealth: Payer: Self-pay | Admitting: Family Medicine

## 2016-09-18 NOTE — Telephone Encounter (Signed)
Ok with change.   From review it is not clear if it is PT's opinion or the patient's, however I am ok with the change as requested.   Laroy Apple, MD [facility] 09/18/2016, 11:18 AM

## 2016-09-18 NOTE — Telephone Encounter (Signed)
Please review and advise.

## 2016-09-18 NOTE — Telephone Encounter (Signed)
Pt notified note is ready for pick up

## 2016-09-28 ENCOUNTER — Encounter: Payer: Self-pay | Admitting: Physical Therapy

## 2016-09-28 ENCOUNTER — Ambulatory Visit: Payer: PPO | Attending: Family Medicine | Admitting: Physical Therapy

## 2016-09-28 DIAGNOSIS — M25512 Pain in left shoulder: Secondary | ICD-10-CM | POA: Diagnosis not present

## 2016-09-28 DIAGNOSIS — M25612 Stiffness of left shoulder, not elsewhere classified: Secondary | ICD-10-CM | POA: Insufficient documentation

## 2016-09-28 NOTE — Therapy (Signed)
[facility] [place], [place], [address] Phone: [phone]   Fax:  [phone]  Physical Therapy Treatment  Patient Details  Name: TERRIL CHESTNUT MRN: [id number] Date of Birth: 01-25-1946 Referring Provider: Kenn File MD.  Encounter Date: 09/28/2016      PT End of Session - 09/28/16 0806    Visit Number 5   Number of Visits 12   Date for PT Re-Evaluation 10/20/16   PT Start Time 0817   PT Stop Time 0901   PT Time Calculation (min) 44 min   Activity Tolerance Patient tolerated treatment well   Behavior During Therapy [facility] for tasks assessed/performed      Past Medical History:  Diagnosis Date  . CAD in native artery   . GERD (gastroesophageal reflux disease)   . Gout   . Hypertension   . PAD (peripheral artery disease) ([place]) 1994    Past Surgical History:  Procedure Laterality Date  . AORTA - BILATERAL FEMORAL ARTERY BYPASS GRAFT  2001  . BACK SURGERY    . cardiac cath  03/14/14   non obstructive disease  . FEMORAL-POPLITEAL BYPASS GRAFT  1994   Dr. Amedeo Plenty  . I&D EXTREMITY Right 01/09/2015   Procedure: IRRIGATION AND DEBRIDEMENT RIGHT THUMB/HAND ;  Surgeon: Roseanne Kaufman, MD;  Location: [place];  Service: Orthopedics;  Laterality: Right;  . LEFT HEART CATHETERIZATION WITH CORONARY ANGIOGRAM N/A 03/06/2014   Procedure: LEFT HEART CATHETERIZATION WITH CORONARY ANGIOGRAM;  Surgeon: Peter M Martinique, MD;  Location: [facility] CATH LAB;  Service: Cardiovascular;  Laterality: N/A;    There were no vitals filed for this visit.      Subjective Assessment - 09/28/16 0818    Subjective Reports that he hasn't seen a difference in his shoulder just if he doesn't use it it stiffens up. Reports that he is interested in doing dry needling. Patient reports that his pain is intermittant and hurts with use.   Pertinent History Neck surgery.   Patient Stated Goals Get out of pain.   Currently in Pain? No/denies                          [facility] Adult PT Treatment/Exercise - 09/28/16 0001      Shoulder Exercises: Sidelying   External Rotation Strengthening;Left;20 reps;Weights   External Rotation Weight (lbs) 2     Shoulder Exercises: Standing   Protraction Strengthening;Left;20 reps;Theraband   Theraband Level (Shoulder Protraction) Level 2 (Red)   External Rotation Strengthening;Left;20 reps;Theraband   Theraband Level (Shoulder External Rotation) Level 2 (Red)   Internal Rotation Strengthening;Left;20 reps;Theraband   Theraband Level (Shoulder Internal Rotation) Level 2 (Red)   Flexion Strengthening;Both;20 reps;Weights   Shoulder Flexion Weight (lbs) 2   Extension Strengthening;Left;20 reps;Theraband   Theraband Level (Shoulder Extension) Level 2 (Red)   Row Strengthening;Left;20 reps;Theraband   Theraband Level (Shoulder Row) Level 2 (Red)   Other Standing Exercises B shoulde scaption 2# x20 reps   Other Standing Exercises LUE wall slides with ER x30 reps     Shoulder Exercises: ROM/Strengthening   UBE (Upper Arm Bike) UBE 120 RPM x6 min     Shoulder Exercises: Stretch   Corner Stretch 3 reps;20 seconds   Internal Rotation Stretch 3 reps  x20 sec hold   External Rotation Stretch 3 reps;30 seconds     Modalities   Modalities Electrical Stimulation;Moist Heat     Moist Heat Therapy   Number Minutes Moist Heat 15  Minutes   Moist Heat Location Shoulder     Electrical Stimulation   Electrical Stimulation Location L shoulder   Electrical Stimulation Action IFC   Electrical Stimulation Parameters 1-10 hz x15 min   Electrical Stimulation Goals Pain;Tone     Manual Therapy   Manual Therapy Soft tissue mobilization   Soft tissue mobilization STW to L deltoids, lat Pectoralis, posterior shoulder to reduce tone and pain                     PT Long Term Goals - 09/17/16 0840      PT LONG TERM GOAL #1   Title Independent with a HEP.   Time 6   Period  Weeks   Status Achieved     PT LONG TERM GOAL #2   Title Perform ADL's with pain not > 2-3/10.   Time 6   Period Weeks   Status On-going               Plan - 09/28/16 0859    Clinical Impression Statement Patient tolerated today's treatment fairly well although he continues to have intermittant pain with R shoulder use. Patient guided through multiple stretches of R shoulder to assist with reduction of pain and tone. Patient's resistance for RW4 increased to red with increased discomfort noted upon end of ER. Taut band of tightness palpated in R deltoids region today and only minimal tightness in R posterior shoulder. Normal modalities response noted following removal of the modallities.   Rehab Potential Excellent   PT Frequency 2x / week   PT Duration 6 weeks   PT Treatment/Interventions ADLs/Self Care Home Management;Cryotherapy;Electrical Stimulation;Moist Heat;Ultrasound;Therapeutic exercise;Therapeutic activities;Manual techniques;Passive range of motion;Dry needling   PT Next Visit Plan Continue with L shoulder strengthening with manual therapy and modalities per MPT POC.   Consulted and Agree with Plan of Care Patient      Patient will benefit from skilled therapeutic intervention in order to improve the following deficits and impairments:  Decreased activity tolerance, Decreased strength, Decreased range of motion, Pain  Visit Diagnosis: Acute pain of left shoulder  Stiffness of left shoulder, not elsewhere classified     Problem List Patient Active Problem List   Diagnosis Date Noted  . CAD in native artery 03/28/2014  . Hyperlipidemia LDL goal <70 03/28/2014  . Hypertension     Wynelle Fanny, PTA 09/28/2016, 9:10 AM  [facility] [address] [place], [place], [address] Phone: [phone]   Fax:  [phone]  Name: Manuel Barrett MRN: [id number] Date of Birth: April 01, 1946

## 2016-09-30 ENCOUNTER — Encounter: Payer: Self-pay | Admitting: Physical Therapy

## 2016-09-30 ENCOUNTER — Ambulatory Visit: Payer: PPO | Admitting: Physical Therapy

## 2016-09-30 DIAGNOSIS — M25512 Pain in left shoulder: Secondary | ICD-10-CM

## 2016-09-30 DIAGNOSIS — M25612 Stiffness of left shoulder, not elsewhere classified: Secondary | ICD-10-CM

## 2016-09-30 NOTE — Therapy (Signed)
[facility] [place], [place], [address] Phone: [phone]   Fax:  [phone]  Physical Therapy Treatment  Patient Details  Name: Manuel Barrett MRN: [id number] Date of Birth: June 24, 1945 Referring Provider: Kenn File MD.  Encounter Date: 09/30/2016      PT End of Session - 09/30/16 0918    Visit Number 6   Number of Visits 12   Date for PT Re-Evaluation 10/20/16   PT Start Time 0900   PT Stop Time 0941   PT Time Calculation (min) 41 min   Activity Tolerance Patient tolerated treatment well   Behavior During Therapy [facility] for tasks assessed/performed      Past Medical History:  Diagnosis Date  . CAD in native artery   . GERD (gastroesophageal reflux disease)   . Gout   . Hypertension   . PAD (peripheral artery disease) ([place]) 1994    Past Surgical History:  Procedure Laterality Date  . AORTA - BILATERAL FEMORAL ARTERY BYPASS GRAFT  2001  . BACK SURGERY    . cardiac cath  03/14/14   non obstructive disease  . FEMORAL-POPLITEAL BYPASS GRAFT  1994   Dr. Amedeo Plenty  . I&D EXTREMITY Right 01/09/2015   Procedure: IRRIGATION AND DEBRIDEMENT RIGHT THUMB/HAND ;  Surgeon: Roseanne Kaufman, MD;  Location: [place];  Service: Orthopedics;  Laterality: Right;  . LEFT HEART CATHETERIZATION WITH CORONARY ANGIOGRAM N/A 03/06/2014   Procedure: LEFT HEART CATHETERIZATION WITH CORONARY ANGIOGRAM;  Surgeon: Peter M Martinique, MD;  Location: [facility] CATH LAB;  Service: Cardiovascular;  Laterality: N/A;    There were no vitals filed for this visit.      Subjective Assessment - 09/30/16 0904    Subjective Patient reported doing well after last treatment   Pertinent History Neck surgery.   Patient Stated Goals Get out of pain.   Currently in Pain? Yes   Pain Score 5    Pain Location Shoulder   Pain Orientation Left   Pain Descriptors / Indicators Sharp   Pain Type Acute pain   Pain Onset More than a month ago   Pain Frequency  Intermittent   Aggravating Factors  certain movements   Pain Relieving Factors at rest                         [facility] Adult PT Treatment/Exercise - 09/30/16 0001      Shoulder Exercises: Supine   Protraction Strengthening;Left;20 reps;Weights   Protraction Weight (lbs) 2     Shoulder Exercises: Prone   Retraction Strengthening;Left;20 reps;Weights   Retraction Weight (lbs) 2   Extension Strengthening;Left;20 reps;Weights   Extension Weight (lbs) 2     Shoulder Exercises: Sidelying   External Rotation Strengthening;Left;20 reps;Weights   External Rotation Weight (lbs) 2     Shoulder Exercises: Standing   Protraction Strengthening;Left;Theraband  3x10   Theraband Level (Shoulder Protraction) Level 2 (Red)   External Rotation Strengthening;Left;Theraband  3x10   Theraband Level (Shoulder External Rotation) Level 2 (Red)   Internal Rotation Strengthening;Left;20 reps;Theraband  3x10   Theraband Level (Shoulder Internal Rotation) Level 2 (Red)   Flexion Strengthening;Left;20 reps;Weights   Shoulder Flexion Weight (lbs) 2   Extension Strengthening;Left;20 reps;Theraband  3x10   Theraband Level (Shoulder Extension) Level 2 (Red)   Other Standing Exercises 2# scaption 2x10 left UE     Shoulder Exercises: ROM/Strengthening   UBE (Upper Arm Bike) UBE 120 RPM x6 min     Moist Heat Therapy  Number Minutes Moist Heat 15 Minutes   Moist Heat Location Shoulder     Electrical Stimulation   Electrical Stimulation Location L shoulder   Electrical Stimulation Action IFC   Electrical Stimulation Parameters 1-10hz  x62min   Electrical Stimulation Goals Pain;Tone                     PT Long Term Goals - 09/17/16 0840      PT LONG TERM GOAL #1   Title Independent with a HEP.   Time 6   Period Weeks   Status Achieved     PT LONG TERM GOAL #2   Title Perform ADL's with pain not > 2-3/10.   Time 6   Period Weeks   Status On-going                Plan - 09/30/16 6568    Clinical Impression Statement Patient tolerated treatment well today. Today focused on sholder strength and stabilization exercises. Patient able to complete pain free with most exercises. Patient has pain up to 4-5/10 with overhead or ER movements. Patient was directed in pain free range. Patient reported 40% improvement overall yet ongoing pain limitations. Remaining goals ongoing.    Rehab Potential Excellent   PT Frequency 2x / week   PT Duration 6 weeks   PT Treatment/Interventions ADLs/Self Care Home Management;Cryotherapy;Electrical Stimulation;Moist Heat;Ultrasound;Therapeutic exercise;Therapeutic activities;Manual techniques;Passive range of motion;Dry needling   PT Next Visit Plan Continue with L shoulder strengthening with manual therapy and modalities per MPT POC.   Consulted and Agree with Plan of Care Patient      Patient will benefit from skilled therapeutic intervention in order to improve the following deficits and impairments:  Decreased activity tolerance, Decreased strength, Decreased range of motion, Pain  Visit Diagnosis: Acute pain of left shoulder  Stiffness of left shoulder, not elsewhere classified     Problem List Patient Active Problem List   Diagnosis Date Noted  . CAD in native artery 03/28/2014  . Hyperlipidemia LDL goal <70 03/28/2014  . Hypertension     DUNFORD, CHRISTINA P, PTA 09/30/2016, 9:48 AM  [facility] [place], [place], [address] Phone: [phone]   Fax:  [phone]  Name: Manuel Barrett MRN: [id number] Date of Birth: Apr 24, 1945

## 2016-10-04 ENCOUNTER — Emergency Department (HOSPITAL_COMMUNITY)
Admission: EM | Admit: 2016-10-04 | Discharge: 2016-10-04 | Disposition: A | Discharge status: Home / Self Care | Payer: Non-veteran care | Attending: Emergency Medicine | Admitting: Emergency Medicine

## 2016-10-04 ENCOUNTER — Emergency Department (HOSPITAL_COMMUNITY): Payer: Non-veteran care

## 2016-10-04 DIAGNOSIS — I2510 Atherosclerotic heart disease of native coronary artery without angina pectoris: Secondary | ICD-10-CM | POA: Diagnosis not present

## 2016-10-04 DIAGNOSIS — I10 Essential (primary) hypertension: Secondary | ICD-10-CM | POA: Diagnosis not present

## 2016-10-04 DIAGNOSIS — R109 Unspecified abdominal pain: Secondary | ICD-10-CM | POA: Insufficient documentation

## 2016-10-04 DIAGNOSIS — Z79899 Other long term (current) drug therapy: Secondary | ICD-10-CM | POA: Insufficient documentation

## 2016-10-04 DIAGNOSIS — Z7982 Long term (current) use of aspirin: Secondary | ICD-10-CM | POA: Diagnosis not present

## 2016-10-04 DIAGNOSIS — Z87891 Personal history of nicotine dependence: Secondary | ICD-10-CM | POA: Diagnosis not present

## 2016-10-04 DIAGNOSIS — R319 Hematuria, unspecified: Secondary | ICD-10-CM | POA: Diagnosis not present

## 2016-10-04 LAB — URINALYSIS, ROUTINE W REFLEX MICROSCOPIC
Bilirubin Urine: NEGATIVE
Glucose, UA: NEGATIVE mg/dL
Hgb urine dipstick: NEGATIVE
Ketones, ur: NEGATIVE mg/dL
Leukocytes, UA: NEGATIVE
Nitrite: NEGATIVE
Protein, ur: 100 mg/dL — AB
Specific Gravity, Urine: 1.021 (ref 1.005–1.030)
pH: 5 (ref 5.0–8.0)

## 2016-10-04 LAB — BASIC METABOLIC PANEL
ANION GAP: 5 (ref 5–15)
BUN: 15 mg/dL (ref 6–20)
CHLORIDE: 112 mmol/L — AB (ref 101–111)
CO2: 26 mmol/L (ref 22–32)
Calcium: 8.9 mg/dL (ref 8.9–10.3)
Creatinine, Ser: 1.34 mg/dL — ABNORMAL HIGH (ref 0.61–1.24)
GFR, EST NON AFRICAN AMERICAN: 53 mL/min — AB (ref >60)
Glucose, Bld: 99 mg/dL (ref 65–99)
POTASSIUM: 4 mmol/L (ref 3.5–5.1)
SODIUM: 143 mmol/L (ref 135–145)

## 2016-10-04 LAB — CBC WITH DIFFERENTIAL/PLATELET
BASOS ABS: 0 10*3/uL (ref 0.0–0.1)
BASOS PCT: 0 %
EOS ABS: 0.1 10*3/uL (ref 0.0–0.7)
EOS PCT: 2 %
HCT: 43.8 % (ref 39.0–52.0)
HEMOGLOBIN: 15.2 g/dL (ref 13.0–17.0)
Lymphocytes Relative: 25 %
Lymphs Abs: 1.6 10*3/uL (ref 0.7–4.0)
MCH: 30.5 pg (ref 26.0–34.0)
MCHC: 34.7 g/dL (ref 30.0–36.0)
MCV: 87.8 fL (ref 78.0–100.0)
Monocytes Absolute: 0.6 10*3/uL (ref 0.1–1.0)
Monocytes Relative: 9 %
Neutro Abs: 4.1 10*3/uL (ref 1.7–7.7)
Neutrophils Relative %: 64 %
PLATELETS: 211 10*3/uL (ref 150–400)
RBC: 4.99 MIL/uL (ref 4.22–5.81)
RDW: 13.1 % (ref 11.5–15.5)
WBC: 6.4 10*3/uL (ref 4.0–10.5)

## 2016-10-04 MED ORDER — OXYCODONE-ACETAMINOPHEN 5-325 MG PO TABS: 1.0000 | ORAL_TABLET | ORAL | 0 refills | Status: DC | PRN

## 2016-10-04 MED ORDER — HYDROMORPHONE HCL 1 MG/ML IJ SOLN
1.0000 mg | Freq: Once | INTRAMUSCULAR | Status: AC
  Administered 2016-10-04: 1 mg via INTRAVENOUS
  Filled 2016-10-04: qty 1

## 2016-10-04 MED ORDER — ONDANSETRON HCL 4 MG/2ML IJ SOLN
4.0000 mg | Freq: Once | INTRAMUSCULAR | Status: AC
  Administered 2016-10-04: 4 mg via INTRAVENOUS
  Filled 2016-10-04: qty 2

## 2016-10-04 NOTE — ED Triage Notes (Signed)
PResents with 2 weeks of sharp aching left flank pain that radiates in left groin, associated with intermittent hematuria. The pain is made worse with movement and exertion and better with rest. The pain is constant but changes in intensity. Denies fevers.

## 2016-10-04 NOTE — ED Provider Notes (Signed)
[facility] DEPT Provider Note   CSN: [id number] Arrival date & time: 10/04/16  1722     History   Chief Complaint Chief Complaint  Patient presents with  . Flank Pain    HPI Manuel Barrett is a 71 y.o. male.  HPI  Manuel Barrett is a 71 y.o. male who presents to the Emergency Department complaining of left flank pain intermittently for 2 weeks.  At times, he describes a sharp pain that seem to be associated with certain movements.  Reports worsening pain for 2-3 days and several episodes of bloody urine.  States that pain feels similar to previous kidney stone.  He denies fever, nausea, vomiting, abd pain, extremity pain, numbness, testicular pain or swelling.    Past Medical History:  Diagnosis Date  . CAD in native artery   . GERD (gastroesophageal reflux disease)   . Gout   . Hypertension   . PAD (peripheral artery disease) ([place]) 1994    Patient Active Problem List   Diagnosis Date Noted  . CAD in native artery 03/28/2014  . Hyperlipidemia LDL goal <70 03/28/2014  . Hypertension     Past Surgical History:  Procedure Laterality Date  . AORTA - BILATERAL FEMORAL ARTERY BYPASS GRAFT  2001  . BACK SURGERY    . cardiac cath  03/14/14   non obstructive disease  . FEMORAL-POPLITEAL BYPASS GRAFT  1994   Dr. Amedeo Plenty  . I&D EXTREMITY Right 01/09/2015   Procedure: IRRIGATION AND DEBRIDEMENT RIGHT THUMB/HAND ;  Surgeon: Roseanne Kaufman, MD;  Location: [facility];  Service: Orthopedics;  Laterality: Right;  . LEFT HEART CATHETERIZATION WITH CORONARY ANGIOGRAM N/A 03/06/2014   Procedure: LEFT HEART CATHETERIZATION WITH CORONARY ANGIOGRAM;  Surgeon: Peter M Martinique, MD;  Location: [facility] CATH LAB;  Service: Cardiovascular;  Laterality: N/A;       Home Medications    Prior to Admission medications   Medication Sig Start Date End Date Taking? Authorizing Provider  acetaminophen (TYLENOL) 500 MG tablet Take 1,500 mg by mouth every 6 (six) hours as needed for moderate pain.     [provider]  acidophilus (RISAQUAD) CAPS capsule Take 1 capsule by mouth daily.    [provider]  allopurinol (ZYLOPRIM) 100 MG tablet Take 100 mg by mouth daily.    [provider]  amLODipine (NORVASC) 5 MG tablet Take 1 tablet (5 mg total) by mouth daily. Patient taking differently: Take 10 mg by mouth daily.  05/11/16   Veryl Speak, MD  aspirin 325 MG tablet Take 325 mg by mouth daily.    [provider]  atorvastatin (LIPITOR) 40 MG tablet Take 1 tablet (40 mg total) by mouth daily at 6 PM. 08/08/15   Timmothy Euler, MD  colchicine 0.6 MG tablet Take 1 tablet (0.6 mg total) by mouth daily. 08/12/16   Timmothy Euler, MD  ibuprofen (ADVIL,MOTRIN) 200 MG tablet Take 400 mg by mouth every 6 (six) hours as needed for mild pain.    [provider]  losartan (COZAAR) 50 MG tablet Take 50 mg by mouth daily.    [provider]  metoprolol (LOPRESSOR) 50 MG tablet Take 1 tablet (50 mg total) by mouth 2 (two) times daily. 08/08/15   Timmothy Euler, MD  nitroGLYCERIN (NITROSTAT) 0.4 MG SL tablet Place 1 tablet (0.4 mg total) under the tongue every 5 (five) minutes as needed for chest pain. 03/09/14   Martinique, Peter M, MD  omeprazole (PRILOSEC) 20 MG capsule Take  1 capsule (20 mg total) by mouth daily as needed (acid reflux). 08/08/15   Timmothy Euler, MD    Family History Family History  Problem Relation Age of Onset  . Heart failure Father   . COPD Father     Social History Social History  Substance Use Topics  . Smoking status: Former Smoker    Packs/day: 1.50    Years: 30.00    Types: Cigarettes    Quit date: 04/20/1992  . Smokeless tobacco: Never Used  . Alcohol use 0.0 oz/week     Comment: 1 beer occasionally      Allergies   Indomethacin   Review of Systems Review of Systems  Constitutional: Negative.   HENT: Negative for ear pain and sore throat.   Eyes: Negative.   Respiratory: Negative for cough and  shortness of breath.   Cardiovascular: Negative for chest pain.  Gastrointestinal: Negative for abdominal pain, nausea and vomiting.  Genitourinary: Positive for flank pain and hematuria. Negative for difficulty urinating, dysuria, frequency, penile pain, penile swelling, scrotal swelling and testicular pain.  Musculoskeletal: Negative for back pain and neck pain.  Skin: Negative for rash.  Neurological: Negative for dizziness, syncope, weakness and headaches.  Hematological: Does not bruise/bleed easily.  Psychiatric/Behavioral: The patient is not nervous/anxious.      Physical Exam Updated Vital Signs BP (!) 155/71   Pulse 60   Temp 97.7 F (36.5 C) (Oral)   Resp 16   Wt 80.7 kg (178 lb)   SpO2 98%   BMI 25.54 kg/m   Physical Exam  Constitutional: He is oriented to person, place, and time. He appears well-developed and well-nourished. No distress.  HENT:  Mouth/Throat: Oropharynx is clear and moist.  Cardiovascular: Normal rate, regular rhythm and normal heart sounds.   Pulmonary/Chest: Effort normal and breath sounds normal. No respiratory distress.  Abdominal: Soft. He exhibits no distension. There is no tenderness. There is no guarding and no CVA tenderness.  Musculoskeletal: Normal range of motion. He exhibits no tenderness.  Negative SLR bilaterally  Neurological: He is alert and oriented to person, place, and time. No cranial nerve deficit.  Skin: Skin is warm. Capillary refill takes less than 2 seconds. No rash noted.  Nursing note and vitals reviewed.    ED Treatments / Results  Labs (all labs ordered are listed, but only abnormal results are displayed) Labs Reviewed  URINALYSIS, ROUTINE W REFLEX MICROSCOPIC - Abnormal; Notable for the following:       Result Value   Protein, ur 100 (*)    Bacteria, UA RARE (*)    Squamous Epithelial / LPF 0-5 (*)    All other components within normal limits  CBC WITH DIFFERENTIAL/PLATELET  BASIC METABOLIC PANEL    EKG   EKG Interpretation None       Radiology Ct Renal Stone Study  Result Date: 10/04/2016 CLINICAL DATA:  71 year old male with left-sided flank pain. EXAM: CT ABDOMEN AND PELVIS WITHOUT CONTRAST TECHNIQUE: Multidetector CT imaging of the abdomen and pelvis was performed following the standard protocol without IV contrast. COMPARISON:  Abdominal CT dated 07/11/2015 FINDINGS: Evaluation of this exam is limited in the absence of intravenous contrast. Lower chest: There are bibasilar atelectatic changes/ scarring. There is a 4 mm right middle lobe pulmonary nodule (series 2, image 1). There is coronary vascular calcification primarily involving the LAD and RCA. There is no intra-abdominal free air or free fluid. Hepatobiliary: No focal liver abnormality is seen. No gallstones, gallbladder wall thickening,  or biliary dilatation. Pancreas: Unremarkable. No pancreatic ductal dilatation or surrounding inflammatory changes. Spleen: Normal in size without focal abnormality. Adrenals/Urinary Tract: A 1 cm right renal inferior pole hypodense lesion is incompletely characterized on this noncontrast CT but most likely represents a cyst and appears similar to the prior CT. There is no hydronephrosis or nephrolithiasis on either side. The visualized ureters and urinary bladder appear unremarkable. The adrenal glands are unremarkable as well. Stomach/Bowel: Small sigmoid diverticula without active inflammatory changes. There is no evidence of bowel obstruction or active inflammation. The appendix is not visualized with certainty. No inflammatory changes identified in the right lower quadrant. Vascular/Lymphatic: There is aortoiliac atherosclerotic disease. Left aortoiliac stent graft again noted with calcified wall. Evaluation of the vasculature is limited in the absence of intravenous contrast. No portal venous gas identified. There is no adenopathy. Reproductive: The prostate and seminal vesicles are grossly unremarkable.  Other: Small fat containing left inguinal hernia. Bilateral inguinal surgical clips noted. Musculoskeletal: There is degenerative changes of the spine. No acute fracture. IMPRESSION: 1. No acute intra-abdominal or pelvic pathology. Specifically there is no hydronephrosis or nephrolithiasis. 2. No bowel obstruction or active inflammation. 3. Bibasilar atelectasis/scarring and a 4 mm right middle lobe pulmonary nodule. 4.  Aortic Atherosclerosis (ICD10-I70.0). Electronically Signed   By: Anner Crete M.D.   On: 10/04/2016 19:44    Procedures Procedures (including critical care time)  Medications Ordered in ED Medications  HYDROmorphone (DILAUDID) injection 1 mg (1 mg Intravenous Given 10/04/16 1800)  ondansetron (ZOFRAN) injection 4 mg (4 mg Intravenous Given 10/04/16 1800)     Initial Impression / Assessment and Plan / ED Course  I have reviewed the triage vital signs and the nursing notes.  Pertinent labs & imaging results that were available during my care of the patient were reviewed by me and considered in my medical decision making (see chart for details).     Pt is resting comfortably, pain has resolved after pain medication.  Ambulates with a steady gait.  Appears stable for d/c, agrees to urology f/u.  Return precautions also discussed.   Final Clinical Impressions(s) / ED Diagnoses   Final diagnoses:  Left flank pain    New Prescriptions New Prescriptions   No medications on file     Bufford Lope 10/06/16 2246    Virgel Manifold, MD 10/08/16 1035

## 2016-10-04 NOTE — ED Triage Notes (Signed)
Pt reports l flank pain for the last 2 weeks Feels as if he has a kidney stone  Has had a kidney stone in the past  [facility] is PCP

## 2016-10-04 NOTE — Discharge Instructions (Signed)
Follow-up with your primary doctor this week for recheck.  Return here for any worsening symptoms

## 2016-10-05 ENCOUNTER — Ambulatory Visit: Payer: PPO | Admitting: Physical Therapy

## 2016-10-05 DIAGNOSIS — M25512 Pain in left shoulder: Secondary | ICD-10-CM | POA: Diagnosis not present

## 2016-10-05 DIAGNOSIS — M25612 Stiffness of left shoulder, not elsewhere classified: Secondary | ICD-10-CM

## 2016-10-05 NOTE — Patient Instructions (Signed)
Trigger Point Dry Needling  . What is Trigger Point Dry Needling (DN)? o DN is a physical therapy technique used to treat muscle pain and dysfunction. Specifically, DN helps deactivate muscle trigger points (muscle knots).  o A thin filiform needle is used to penetrate the skin and stimulate the underlying trigger point. The goal is for a local twitch response (LTR) to occur and for the trigger point to relax. No medication of any kind is injected during the procedure.   . What Does Trigger Point Dry Needling Feel Like?  o The procedure feels different for each individual patient. Some patients report that they do not actually feel the needle enter the skin and overall the process is not painful. Very mild bleeding may occur. However, many patients feel a deep cramping in the muscle in which the needle was inserted. This is the local twitch response.   Marland Kitchen How Will I feel after the treatment? o Soreness is normal, and the onset of soreness may not occur for a few hours. Typically this soreness does not last longer than two days.  o Bruising is uncommon, however; ice can be used to decrease any possible bruising.  o In rare cases feeling tired or nauseous after the treatment is normal. In addition, your symptoms may get worse before they get better, this period will typically not last longer than 24 hours.   . What Can I do After My Treatment? o Increase your hydration by drinking more water for the next 24 hours. o You may place ice or heat on the areas treated that have become sore, however, do not use heat on inflamed or bruised areas. Heat often brings more relief post needling. o You can continue your regular activities, but vigorous activity is not recommended initially after the treatment for 24 hours. o DN is best combined with other physical therapy such as strengthening, stretching, and other therapies.    Precautions:  In some cases, dry needling is done over the lung field. While rare,  there is a risk of pneumothorax (punctured lung). Because of this, if you ever experience shortness of breath on exertion, difficulty taking a deep breath, chest pain or a dry cough following dry needling, you should report to an emergency room and tell them that you have been dry needled over the thorax.       Madelyn Flavors, PT 10/05/16 9:03 AM;[facility] [place], [place], [address] Phone: [phone]   Fax:  [phone]

## 2016-10-05 NOTE — Therapy (Signed)
[facility] [place], [place], [address] Phone: [phone]   Fax:  [phone]  Physical Therapy Treatment  Patient Details  Name: Manuel Barrett MRN: [id number] Date of Birth: 09-Apr-1946 Referring Provider: Kenn File MD.  Encounter Date: 10/05/2016      PT End of Session - 10/05/16 0810    Visit Number 7   Number of Visits 12   Date for PT Re-Evaluation 10/20/16   PT Start Time 0815   PT Stop Time 0913   PT Time Calculation (min) 58 min   Activity Tolerance Patient tolerated treatment well   Behavior During Therapy [facility] for tasks assessed/performed      Past Medical History:  Diagnosis Date  . CAD in native artery   . GERD (gastroesophageal reflux disease)   . Gout   . Hypertension   . PAD (peripheral artery disease) ([place]) 1994    Past Surgical History:  Procedure Laterality Date  . AORTA - BILATERAL FEMORAL ARTERY BYPASS GRAFT  2001  . BACK SURGERY    . cardiac cath  03/14/14   non obstructive disease  . FEMORAL-POPLITEAL BYPASS GRAFT  1994   Dr. Amedeo Plenty  . I&D EXTREMITY Right 01/09/2015   Procedure: IRRIGATION AND DEBRIDEMENT RIGHT THUMB/HAND ;  Surgeon: Roseanne Kaufman, MD;  Location: [facility];  Service: Orthopedics;  Laterality: Right;  . LEFT HEART CATHETERIZATION WITH CORONARY ANGIOGRAM N/A 03/06/2014   Procedure: LEFT HEART CATHETERIZATION WITH CORONARY ANGIOGRAM;  Surgeon: Peter M Martinique, MD;  Location: [facility] CATH LAB;  Service: Cardiovascular;  Laterality: N/A;    There were no vitals filed for this visit.      Subjective Assessment - 10/05/16 0810    Subjective Patient states he had a few days where his shoulder was doing well and now it hurts again.    Patient Stated Goals Get out of pain.   Currently in Pain? Yes   Pain Score 5    Pain Orientation Left   Pain Descriptors / Indicators Aching;Sharp   Pain Type Acute pain   Pain Onset More than a month ago   Pain Frequency Intermittent    Aggravating Factors  certain movements   Pain Relieving Factors rest            [facility] PT Assessment - 10/05/16 0001      Posture/Postural Control   Posture Comments Stands with Left shoulder elevation and has increased tone in L lumbar paraspinals as well, L shoulder if forward compared to R.     Palpation   Palpation comment marked hypersensitivity in L low back and side due to possible kidney stone.                     [facility] Adult PT Treatment/Exercise - 10/05/16 0001      Self-Care   Self-Care Other Self-Care Comments  MFR with tennis ball     Shoulder Exercises: ROM/Strengthening   UBE (Upper Arm Bike) UBE 120 RPM x6 min     Modalities   Modalities Electrical Stimulation;Moist Heat     Moist Heat Therapy   Number Minutes Moist Heat 15 Minutes   Moist Heat Location Shoulder     Electrical Stimulation   Electrical Stimulation Location L shoulder   Electrical Stimulation Action IFC   Electrical Stimulation Parameters 80-150 Hz x 29min   Electrical Stimulation Goals Pain     Manual Therapy   Manual Therapy Soft tissue mobilization   Soft tissue mobilization to  L UT, deltoids and pectorals   Myofascial Release to R pecs          Trigger Point Dry Needling - 10/05/16 0910    Consent Given? Yes   Education Handout Provided Yes   Muscles Treated Upper Body Upper trapezius;Pectoralis major;Pectoralis minor;Levator scapulae;Infraspinatus;Subscapularis  Left; and middle deltoid and lats   Upper Trapezius Response Twitch reponse elicited;Palpable increased muscle length   Pectoralis Major Response Twitch response elicited;Palpable increased muscle length   Pectoralis Minor Response Twitch response elicited;Palpable increased muscle length   Levator Scapulae Response Twitch response elicited;Palpable increased muscle length   Infraspinatus Response Twitch response elicited;Palpable increased muscle length   Subscapularis Response Twitch response  elicited;Palpable increased muscle length              PT Education - 10/05/16 1106    Education provided Yes   Education Details DN education and aftercare; self MFR with ball   Person(s) Educated Patient   Methods Explanation;Demonstration;Verbal cues   Comprehension Verbalized understanding             PT Long Term Goals - 09/17/16 0840      PT LONG TERM GOAL #1   Title Independent with a HEP.   Time 6   Period Weeks   Status Achieved     PT LONG TERM GOAL #2   Title Perform ADL's with pain not > 2-3/10.   Time 6   Period Weeks   Status On-going               Plan - 10/05/16 0914    Clinical Impression Statement Patient consented to TPDN and did very well with it today. He had localized twitch response  (LTR) in all muscles needed with ++ LTR in L deltoids, subscapularis and Lev Scap. He demonstrated equal shoulder height after treatment. Normal response to modalities.   PT Treatment/Interventions ADLs/Self Care Home Management;Cryotherapy;Electrical Stimulation;Moist Heat;Ultrasound;Therapeutic exercise;Therapeutic activities;Manual techniques;Passive range of motion;Dry needling   PT Next Visit Plan Assess DN, Continue with L shoulder strengthening with manual therapy and modalities per MPT POC.       Patient will benefit from skilled therapeutic intervention in order to improve the following deficits and impairments:  Decreased activity tolerance, Decreased strength, Decreased range of motion, Pain  Visit Diagnosis: Acute pain of left shoulder  Stiffness of left shoulder, not elsewhere classified     Problem List Patient Active Problem List   Diagnosis Date Noted  . CAD in native artery 03/28/2014  . Hyperlipidemia LDL goal <70 03/28/2014  . Hypertension     Madelyn Flavors PT 10/05/2016, 11:13 AM  [facility] [address] [place], [place], [address] Phone: [phone]   Fax:   [phone]  Name: Manuel Barrett MRN: [id number] Date of Birth: 07-15-1945

## 2016-10-06 ENCOUNTER — Ambulatory Visit (INDEPENDENT_AMBULATORY_CARE_PROVIDER_SITE_OTHER): Payer: PPO | Admitting: Family Medicine

## 2016-10-06 ENCOUNTER — Encounter: Payer: Self-pay | Admitting: Family Medicine

## 2016-10-06 VITALS — BP 149/80 | HR 61 | Temp 97.4°F | Ht 70.0 in | Wt 175.0 lb

## 2016-10-06 DIAGNOSIS — R361 Hematospermia: Secondary | ICD-10-CM | POA: Diagnosis not present

## 2016-10-06 DIAGNOSIS — M545 Low back pain, unspecified: Secondary | ICD-10-CM

## 2016-10-06 DIAGNOSIS — R319 Hematuria, unspecified: Secondary | ICD-10-CM | POA: Diagnosis not present

## 2016-10-06 LAB — URINALYSIS, COMPLETE
Bilirubin, UA: NEGATIVE
Glucose, UA: NEGATIVE
Ketones, UA: NEGATIVE
NITRITE UA: NEGATIVE
RBC UA: NEGATIVE
Urobilinogen, Ur: 0.2 mg/dL (ref 0.2–1.0)
pH, UA: 6 (ref 5.0–7.5)

## 2016-10-06 LAB — MICROSCOPIC EXAMINATION
BACTERIA UA: NONE SEEN
Renal Epithel, UA: NONE SEEN /hpf

## 2016-10-06 MED ORDER — CEPHALEXIN 500 MG PO CAPS: 500.0000 mg | ORAL_CAPSULE | Freq: Three times a day (TID) | ORAL | 0 refills | Status: DC

## 2016-10-06 MED ORDER — PREDNISONE 20 MG PO TABS: ORAL_TABLET | ORAL | 0 refills | Status: DC

## 2016-10-06 NOTE — Progress Notes (Signed)
   HPI  Patient presents today here back pain.  Patient was seen in the emergency room 2 days ago with back pain. He was worked up for renal calculus with CT scan  He notes hematuria a few times last week, also cloudy urine. He had him at his permeative one time a few weeks ago, this has not recurred.  He denies any nocturia or other LUTS  His pain is described as left sided, "on the bone" left of his spine, feels better with lying against the pillow. Patient states that he has pain after sitting for a while, after he walks for a few minutes he improves.   PMH: Smoking status noted ROS: Per HPI  Objective: BP (!) 149/80   Pulse 61   Temp 97.4 F (36.3 C) (Oral)   Ht _0  (1.778 m)   Wt 175 lb (79.4 kg)   BMI 25.11 kg/m  Gen: NAD, alert, cooperative with exam HEENT: NCAT, EOMI, PERRL CV: RRR, good S1/S2, no murmur Resp: CTABL, no wheezes, non-labored Ext: No edema, warm Neuro: Alert and oriented, No gross deficits MSK:  Tenderness to palpation over the PSIS on the left, negative Faber test of the left hip, mild symptoms with left-sided straight leg raise modified DRE: Normal shaped and sized prostate with normal texture, no nodules, nontender  Assessment and plan:  # Back pain Unclear etiology Urinalysis from the emergency room does not show any concerns of UTI, however are shows 1+ leukocytes, I have sent this for culture Back pain with tenderness over the PSIS, likely musculoskeletal pain Short course of prednisone It with heat recent hematuria as well as hematospermia, PSA, prostate exam is normal today  Considering Leukocytes I will go ahead and cover for UTI with keflex given the danger in giving prednisone if this turned out to be infectious.  Nursing will notify patient.   # Hematuria Microscopic hematuria currently, 0-2 rbc's/hpf Urine culture as above  # hematospermia Checking PSA, no additional repeat episodes. Consider urology visit   Orders  Placed This Encounter  Procedures  . Urine Culture  . Microscopic Examination  . PSA  . CBC with Differential/Platelet  . CMP14+EGFR  . Urinalysis, Complete    Meds ordered this encounter  Medications  . predniSONE (DELTASONE) 20 MG tablet    Sig: 2 po at same time daily for 5 days    Dispense:  10 tablet    Refill:  0  . cephALEXin (KEFLEX) 500 MG capsule    Sig: Take 1 capsule (500 mg total) by mouth 3 (three) times daily.    Dispense:  21 capsule    Refill:  [place], MD [facility] 10/06/2016, 5:21 PM

## 2016-10-06 NOTE — Patient Instructions (Signed)
Great to see you!  We will call with labs or send a letter if everything is normal within 1 week.   Start prednisone 2 pills once daily for 5 days.

## 2016-10-07 ENCOUNTER — Encounter: Payer: Self-pay | Admitting: Physical Therapy

## 2016-10-07 ENCOUNTER — Ambulatory Visit: Payer: PPO | Admitting: Physical Therapy

## 2016-10-07 DIAGNOSIS — M25512 Pain in left shoulder: Secondary | ICD-10-CM | POA: Diagnosis not present

## 2016-10-07 DIAGNOSIS — M25612 Stiffness of left shoulder, not elsewhere classified: Secondary | ICD-10-CM

## 2016-10-07 NOTE — Therapy (Signed)
[facility] [place], [place], [address] Phone: [phone]   Fax:  [phone]  Physical Therapy Treatment  Patient Details  Name: Manuel Barrett MRN: [id number] Date of Birth: 04-04-1946 Referring Provider: Kenn File MD.  Encounter Date: 10/07/2016      PT End of Session - 10/07/16 0738    Visit Number 8   Number of Visits 12   Date for PT Re-Evaluation 10/20/16   PT Start Time 0732   PT Stop Time 0816   PT Time Calculation (min) 44 min   Activity Tolerance Patient tolerated treatment well   Behavior During Therapy [facility] for tasks assessed/performed      Past Medical History:  Diagnosis Date  . CAD in native artery   . GERD (gastroesophageal reflux disease)   . Gout   . Hypertension   . PAD (peripheral artery disease) ([place]) 1994    Past Surgical History:  Procedure Laterality Date  . AORTA - BILATERAL FEMORAL ARTERY BYPASS GRAFT  2001  . BACK SURGERY    . cardiac cath  03/14/14   non obstructive disease  . FEMORAL-POPLITEAL BYPASS GRAFT  1994   Dr. Amedeo Plenty  . I&D EXTREMITY Right 01/09/2015   Procedure: IRRIGATION AND DEBRIDEMENT RIGHT THUMB/HAND ;  Surgeon: Roseanne Kaufman, MD;  Location: [place];  Service: Orthopedics;  Laterality: Right;  . LEFT HEART CATHETERIZATION WITH CORONARY ANGIOGRAM N/A 03/06/2014   Procedure: LEFT HEART CATHETERIZATION WITH CORONARY ANGIOGRAM;  Surgeon: Peter M Martinique, MD;  Location: [facility] CATH LAB;  Service: Cardiovascular;  Laterality: N/A;    There were no vitals filed for this visit.      Subjective Assessment - 10/07/16 0734    Subjective Reports that he has some pain at times but others not. Reports that DN is temporary help. Reports that he can tell some improvement but not like it was.   Pertinent History Neck surgery.   Patient Stated Goals Get out of pain.   Currently in Pain? No/denies            [facility] PT Assessment - 10/07/16 0001      Assessment   Medical  Diagnosis Rotator cuff syndrome of left shoulder.   Next MD Visit None     Precautions   Precautions None     Restrictions   Weight Bearing Restrictions No                     [facility] Adult PT Treatment/Exercise - 10/07/16 0001      Shoulder Exercises: Sidelying   External Rotation Strengthening;Left;20 reps;Weights   External Rotation Weight (lbs) 2     Shoulder Exercises: Standing   Protraction Strengthening;Left;Theraband   Theraband Level (Shoulder Protraction) Level 2 (Red)   Protraction Limitations 3x10 reps   External Rotation Strengthening;Left;Theraband  3x10 reps   Theraband Level (Shoulder External Rotation) Level 2 (Red)   Internal Rotation Strengthening;Left;Theraband   Theraband Level (Shoulder Internal Rotation) Level 2 (Red)   Internal Rotation Limitations 3x10 reps   Extension Strengthening;Left;Theraband   Theraband Level (Shoulder Extension) Level 2 (Red)   Extension Limitations 3x10 reps   Row Strengthening;Left;Theraband   Theraband Level (Shoulder Row) Level 2 (Red)   Row Limitations 3x10 reps     Shoulder Exercises: Pulleys   Flexion Other (comment)  x5 min     Shoulder Exercises: ROM/Strengthening   UBE (Upper Arm Bike) UBE 90 RPM x5 min     Modalities   Modalities Electrical Stimulation;Moist  Heat     Moist Heat Therapy   Number Minutes Moist Heat 15 Minutes   Moist Heat Location Shoulder     Electrical Stimulation   Electrical Stimulation Location L shoulder   Electrical Stimulation Action IFC   Electrical Stimulation Parameters 1-10 hz x15 min   Electrical Stimulation Goals Pain                     PT Long Term Goals - 09/17/16 0840      PT LONG TERM GOAL #1   Title Independent with a HEP.   Time 6   Period Weeks   Status Achieved     PT LONG TERM GOAL #2   Title Perform ADL's with pain not > 2-3/10.   Time 6   Period Weeks   Status On-going               Plan - 10/07/16 0807    Clinical  Impression Statement Patient tolerated today's treatment well although he reports some improvement at times but still has radiating pain in LUE. Tone still palpable over L anteriosuperior humerus and lateral L Pectoralis. Patient experienced L AC joint popping with resisted ER and fatigued with resisted ER. No increased pain reported by patient during SL ER with 2# handweight. Continued greater pain with reaching out with LUE per patient report. Normal modalities response noted following removal of the modalities.   Rehab Potential Excellent   PT Frequency 2x / week   PT Duration 6 weeks   PT Treatment/Interventions ADLs/Self Care Home Management;Cryotherapy;Electrical Stimulation;Moist Heat;Ultrasound;Therapeutic exercise;Therapeutic activities;Manual techniques;Passive range of motion;Dry needling   PT Next Visit Plan Assess DN, Continue with L shoulder strengthening with manual therapy and modalities per MPT POC.    Consulted and Agree with Plan of Care Patient      Patient will benefit from skilled therapeutic intervention in order to improve the following deficits and impairments:  Decreased activity tolerance, Decreased strength, Decreased range of motion, Pain  Visit Diagnosis: Acute pain of left shoulder  Stiffness of left shoulder, not elsewhere classified     Problem List Patient Active Problem List   Diagnosis Date Noted  . CAD in native artery 03/28/2014  . Hyperlipidemia LDL goal <70 03/28/2014  . Hypertension     Wynelle Fanny, PTA 10/07/2016, 8:20 AM  [facility] [address] [place], [place], [address] Phone: [phone]   Fax:  [phone]  Name: Manuel Barrett MRN: [id number] Date of Birth: 03-Mar-1946

## 2016-10-11 ENCOUNTER — Emergency Department (HOSPITAL_COMMUNITY): Payer: Non-veteran care

## 2016-10-11 ENCOUNTER — Encounter (HOSPITAL_COMMUNITY): Payer: Self-pay | Admitting: Emergency Medicine

## 2016-10-11 ENCOUNTER — Emergency Department (HOSPITAL_COMMUNITY)
Admission: EM | Admit: 2016-10-11 | Discharge: 2016-10-11 | Disposition: A | Discharge status: Home / Self Care | Payer: Non-veteran care | Attending: Emergency Medicine | Admitting: Emergency Medicine

## 2016-10-11 DIAGNOSIS — Z7982 Long term (current) use of aspirin: Secondary | ICD-10-CM | POA: Diagnosis not present

## 2016-10-11 DIAGNOSIS — N503 Cyst of epididymis: Secondary | ICD-10-CM | POA: Insufficient documentation

## 2016-10-11 DIAGNOSIS — E876 Hypokalemia: Secondary | ICD-10-CM | POA: Insufficient documentation

## 2016-10-11 DIAGNOSIS — R350 Frequency of micturition: Secondary | ICD-10-CM | POA: Diagnosis present

## 2016-10-11 DIAGNOSIS — Z79899 Other long term (current) drug therapy: Secondary | ICD-10-CM | POA: Diagnosis not present

## 2016-10-11 DIAGNOSIS — I10 Essential (primary) hypertension: Secondary | ICD-10-CM | POA: Diagnosis not present

## 2016-10-11 DIAGNOSIS — R809 Proteinuria, unspecified: Secondary | ICD-10-CM | POA: Diagnosis not present

## 2016-10-11 DIAGNOSIS — I2510 Atherosclerotic heart disease of native coronary artery without angina pectoris: Secondary | ICD-10-CM | POA: Diagnosis not present

## 2016-10-11 DIAGNOSIS — Z87891 Personal history of nicotine dependence: Secondary | ICD-10-CM | POA: Insufficient documentation

## 2016-10-11 DIAGNOSIS — K4090 Unilateral inguinal hernia, without obstruction or gangrene, not specified as recurrent: Secondary | ICD-10-CM

## 2016-10-11 DIAGNOSIS — N433 Hydrocele, unspecified: Secondary | ICD-10-CM | POA: Diagnosis not present

## 2016-10-11 LAB — URINALYSIS, ROUTINE W REFLEX MICROSCOPIC
Bacteria, UA: NONE SEEN
Bilirubin Urine: NEGATIVE
GLUCOSE, UA: NEGATIVE mg/dL
Hgb urine dipstick: NEGATIVE
Ketones, ur: NEGATIVE mg/dL
Leukocytes, UA: NEGATIVE
NITRITE: NEGATIVE
PH: 6 (ref 5.0–8.0)
Protein, ur: 100 mg/dL — AB
RBC / HPF: NONE SEEN RBC/hpf (ref 0–5)
SPECIFIC GRAVITY, URINE: 1.015 (ref 1.005–1.030)
SQUAMOUS EPITHELIAL / LPF: NONE SEEN
WBC, UA: NONE SEEN WBC/hpf (ref 0–5)

## 2016-10-11 LAB — URINE CULTURE

## 2016-10-11 LAB — COMPREHENSIVE METABOLIC PANEL
ALT: 42 U/L (ref 17–63)
ANION GAP: 7 (ref 5–15)
AST: 31 U/L (ref 15–41)
Albumin: 3.4 g/dL — ABNORMAL LOW (ref 3.5–5.0)
Alkaline Phosphatase: 70 U/L (ref 38–126)
BILIRUBIN TOTAL: 0.7 mg/dL (ref 0.3–1.2)
BUN: 15 mg/dL (ref 6–20)
CO2: 26 mmol/L (ref 22–32)
Calcium: 9.1 mg/dL (ref 8.9–10.3)
Chloride: 107 mmol/L (ref 101–111)
Creatinine, Ser: 1.08 mg/dL (ref 0.61–1.24)
GFR calc non Af Amer: >60 mL/min (ref >60)
Glucose, Bld: 92 mg/dL (ref 65–99)
Potassium: 2.8 mmol/L — ABNORMAL LOW (ref 3.5–5.1)
SODIUM: 140 mmol/L (ref 135–145)
TOTAL PROTEIN: 5.8 g/dL — AB (ref 6.5–8.1)

## 2016-10-11 LAB — CBC WITH DIFFERENTIAL/PLATELET
Basophils Absolute: 0 10*3/uL (ref 0.0–0.1)
Basophils Relative: 0 %
EOS ABS: 0.1 10*3/uL (ref 0.0–0.7)
Eosinophils Relative: 1 %
HEMATOCRIT: 45.2 % (ref 39.0–52.0)
HEMOGLOBIN: 15.3 g/dL (ref 13.0–17.0)
LYMPHS ABS: 2.2 10*3/uL (ref 0.7–4.0)
Lymphocytes Relative: 25 %
MCH: 29.8 pg (ref 26.0–34.0)
MCHC: 33.8 g/dL (ref 30.0–36.0)
MCV: 87.9 fL (ref 78.0–100.0)
MONO ABS: 0.9 10*3/uL (ref 0.1–1.0)
MONOS PCT: 10 %
NEUTROS PCT: 64 %
Neutro Abs: 5.6 10*3/uL (ref 1.7–7.7)
Platelets: 192 10*3/uL (ref 150–400)
RBC: 5.14 MIL/uL (ref 4.22–5.81)
RDW: 13.1 % (ref 11.5–15.5)
WBC: 8.8 10*3/uL (ref 4.0–10.5)

## 2016-10-11 LAB — LIPASE, BLOOD: Lipase: 36 U/L (ref 11–51)

## 2016-10-11 MED ORDER — POTASSIUM CHLORIDE CRYS ER 20 MEQ PO TBCR: 20.0000 meq | EXTENDED_RELEASE_TABLET | Freq: Every day | ORAL | 0 refills | Status: DC

## 2016-10-11 MED ORDER — POTASSIUM CHLORIDE CRYS ER 20 MEQ PO TBCR
40.0000 meq | EXTENDED_RELEASE_TABLET | Freq: Once | ORAL | Status: AC
  Administered 2016-10-11: 40 meq via ORAL
  Filled 2016-10-11: qty 2

## 2016-10-11 NOTE — Discharge Instructions (Signed)
You have been evaluated for your left groin pain.  You have a fat containing left inguinal hernia which may be causing your pain. Avoid heavy lifting and follow up with surgery for further care.  Your scrotum ultrasound shows several cysts.  Avoid heavy lifting.  You also have protein in your urine.  Follow up with [facility] for further management.  Your potassium level is low today.  Take supplementation and have it recheck by your primary care provider next week.  Return if you have any concerns.

## 2016-10-11 NOTE — ED Provider Notes (Signed)
[facility] DEPT Provider Note   CSN: [id number] Arrival date & time: 10/11/16  7371     History   Chief Complaint Chief Complaint  Patient presents with  . Urinary Frequency  . Abdominal Pain    HPI Manuel Barrett is a 71 y.o. male.  HPI   71 year old male with history of hypertension, CAD, GERD, gout presenting for evaluation of left groin pain. Patient report intermittent pain to his left groin ongoing for the past 2 weeks. Pain is a pressure tightness sensation sometime worsen with movement. He has noticed increased difficulty urinating, having to push to urinate. Pain was worse last night prompting him to come to the ER. He has been seen a week ago in the ER for this pain. He was evaluated for a possible kidney stone without signs of a kidney stone. He did receive pain medication at that time and his symptoms improved. He was seen by his PCP for follow-up. It was felt that his back pain is muscle skeletal however he was prescribed Keflex for potential UTI, and also prednisone. This is taking his antibiotic as prescribed without any improvement. He admits to doing some heavy lifting and take it may have aggravated his pain. No history of hernia in the past. No penile discharge or rectal pain.  Past Medical History:  Diagnosis Date  . CAD in native artery   . GERD (gastroesophageal reflux disease)   . Gout   . Hypertension   . PAD (peripheral artery disease) ([place]) 1994    Patient Active Problem List   Diagnosis Date Noted  . CAD in native artery 03/28/2014  . Hyperlipidemia LDL goal <70 03/28/2014  . Hypertension     Past Surgical History:  Procedure Laterality Date  . AORTA - BILATERAL FEMORAL ARTERY BYPASS GRAFT  2001  . BACK SURGERY    . cardiac cath  03/14/14   non obstructive disease  . FEMORAL-POPLITEAL BYPASS GRAFT  1994   Dr. Amedeo Plenty  . I&D EXTREMITY Right 01/09/2015   Procedure: IRRIGATION AND DEBRIDEMENT RIGHT THUMB/HAND ;  Surgeon: Roseanne Kaufman, MD;   Location: [place];  Service: Orthopedics;  Laterality: Right;  . LEFT HEART CATHETERIZATION WITH CORONARY ANGIOGRAM N/A 03/06/2014   Procedure: LEFT HEART CATHETERIZATION WITH CORONARY ANGIOGRAM;  Surgeon: Peter M Martinique, MD;  Location: [facility] CATH LAB;  Service: Cardiovascular;  Laterality: N/A;       Home Medications    Prior to Admission medications   Medication Sig Start Date End Date Taking? Authorizing Provider  acetaminophen (TYLENOL) 500 MG tablet Take 1,500 mg by mouth every 6 (six) hours as needed for moderate pain.    [provider]  allopurinol (ZYLOPRIM) 100 MG tablet Take 100 mg by mouth daily.    [provider]  amLODipine (NORVASC) 5 MG tablet Take 1 tablet (5 mg total) by mouth daily. Patient taking differently: Take 10 mg by mouth daily.  05/11/16   Veryl Speak, MD  aspirin EC 81 MG tablet Take 81 mg by mouth daily.    [provider]  atorvastatin (LIPITOR) 40 MG tablet Take 1 tablet (40 mg total) by mouth daily at 6 PM. 08/08/15   Timmothy Euler, MD  cephALEXin (KEFLEX) 500 MG capsule Take 1 capsule (500 mg total) by mouth 3 (three) times daily. 10/06/16   Timmothy Euler, MD  colchicine 0.6 MG tablet Take 1 tablet (0.6 mg total) by mouth daily. 08/12/16   Timmothy Euler, MD  metoprolol (LOPRESSOR) 50  MG tablet Take 1 tablet (50 mg total) by mouth 2 (two) times daily. 08/08/15   Timmothy Euler, MD  nitroGLYCERIN (NITROSTAT) 0.4 MG SL tablet Place 1 tablet (0.4 mg total) under the tongue every 5 (five) minutes as needed for chest pain. 03/09/14   Martinique, Peter M, MD  omeprazole (PRILOSEC) 20 MG capsule Take 1 capsule (20 mg total) by mouth daily as needed (acid reflux). 08/08/15   Timmothy Euler, MD  oxyCODONE-acetaminophen (PERCOCET/ROXICET) 5-325 MG tablet Take 1 tablet by mouth every 4 (four) hours as needed. 10/04/16   Triplett, Tammy, PA-C  predniSONE (DELTASONE) 20 MG tablet 2 po at same time daily for 5 days 10/06/16   Timmothy Euler, MD    Family History Family History  Problem Relation Age of Onset  . Heart failure Father   . COPD Father     Social History Social History  Substance Use Topics  . Smoking status: Former Smoker    Packs/day: 1.50    Years: 30.00    Types: Cigarettes    Quit date: 04/20/1992  . Smokeless tobacco: Never Used  . Alcohol use 0.0 oz/week     Comment: 1 beer occasionally      Allergies   Indomethacin   Review of Systems Review of Systems  All other systems reviewed and are negative.    Physical Exam Updated Vital Signs BP (!) 160/68 (BP Location: Left Arm)   Pulse (!) 50   Temp 98.7 F (37.1 C) (Oral)   Resp 16   Ht 5\' 10"  (1.778 m)   Wt 79.4 kg (175 lb)   SpO2 97%   BMI 25.11 kg/m   Physical Exam  Constitutional: He appears well-developed and well-nourished. No distress.  HENT:  Head: Atraumatic.  Eyes: Conjunctivae are normal.  Neck: Neck supple.  Cardiovascular: Normal rate and regular rhythm.   Pulmonary/Chest: Effort normal and breath sounds normal.  Abdominal: Soft. Bowel sounds are normal. He exhibits no distension. There is no tenderness.  Left CVA tenderness  Genitourinary:  Genitourinary Comments: Chaperone present during exam. Circumcised penis free of lesion or rash. No obvious inguinal lymphadenopathy or inguinal hernia noted. Tenderness to left testicle without any significant swelling. Nodular lesion noted at the epididymal left testicle. Normal cremasteric reflex  Neurological: He is alert.  Skin: No rash noted.  Psychiatric: He has a normal mood and affect.  Nursing note and vitals reviewed.    ED Treatments / Results  Labs (all labs ordered are listed, but only abnormal results are displayed) Labs Reviewed  URINALYSIS, ROUTINE W REFLEX MICROSCOPIC - Abnormal; Notable for the following:       Result Value   Protein, ur 100 (*)    All other components within normal limits  COMPREHENSIVE METABOLIC PANEL - Abnormal; Notable  for the following:    Potassium 2.8 (*)    Total Protein 5.8 (*)    Albumin 3.4 (*)    All other components within normal limits  CBC WITH DIFFERENTIAL/PLATELET  LIPASE, BLOOD    EKG  EKG Interpretation None       Radiology US Scrotum  Result Date: 10/11/2016 CLINICAL DATA:  Left testicular and groin pain 2 weeks. EXAM: SCROTAL ULTRASOUND DOPPLER ULTRASOUND OF THE TESTICLES TECHNIQUE: Complete ultrasound examination of the testicles, epididymis, and other scrotal structures was performed. Color and spectral Doppler ultrasound were also utilized to evaluate blood flow to the testicles. COMPARISON:  CT 10/04/2016 FINDINGS: Right testicle Measurements: 2.1 x 3.4 x  4.4 cm. No mass or microlithiasis visualized. Left testicle Measurements: 2.1 x 2.9 x 4.2 cm. No mass or microlithiasis visualized. Right epididymis:  5 mm echogenic focus over the epididymal head. Left epididymis:  9 mm cyst over the epididymal head. Hydrocele: Moderate-size complicated right hydrocele with internal debris and septations. Small simple left hydrocele. Varicocele:  Bilateral varicoceles. Images over the area of pain in the left groin/inguinal region are unremarkable. Pulsed Doppler interrogation of both testes demonstrates normal low resistance arterial and venous waveforms bilaterally. IMPRESSION: Moderate size complex right hydrocele. This may be a noninfected complex hydrocele. Infection/ pyocele could also have this appearance, but would be unlikely without associated epididymal orchitis or clinical signs of infection. Small simple left hydrocele. If concern for infection, recommend urologic consultation. Normal testicles without evidence of torsion. 9 mm left epididymal cyst. 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 Recommend clinical correlation. These results were called by telephone at the time of interpretation on 10/11/2016 at  1:12 pm to Dr. Domenic Moras , who verbally acknowledged these results. He states there is no erythema and no clinical concern for infection at this time. Electronically Signed   By: Marin Olp M.D.   On: 10/11/2016 13:13   [place] Art/ven Flow Abd Pelv Doppler  Result Date: 10/11/2016 CLINICAL DATA:  Left testicular and groin pain 2 weeks. EXAM: SCROTAL ULTRASOUND DOPPLER ULTRASOUND OF THE TESTICLES TECHNIQUE: Complete ultrasound examination of the testicles, epididymis, and other scrotal structures was performed. Color and spectral Doppler ultrasound were also utilized to evaluate blood flow to the testicles. COMPARISON:  CT 10/04/2016 FINDINGS: Right testicle Measurements: 2.1 x 3.4 x 4.4 cm. No mass or microlithiasis visualized. Left testicle Measurements: 2.1 x 2.9 x 4.2 cm. No mass or microlithiasis visualized. Right epididymis:  5 mm echogenic focus over the epididymal head. Left epididymis:  9 mm cyst over the epididymal head. Hydrocele: Moderate-size complicated right hydrocele with internal debris and septations. Small simple left hydrocele. Varicocele:  Bilateral varicoceles. Images over the area of pain in the left groin/inguinal region are unremarkable. Pulsed Doppler interrogation of both testes demonstrates normal low resistance arterial and venous waveforms bilaterally. IMPRESSION: Moderate size complex right hydrocele. This may be a noninfected complex hydrocele. Infection/ pyocele could also have this appearance, but would be unlikely without associated epididymal orchitis or clinical signs of infection. Small simple left hydrocele. If concern for infection, recommend urologic consultation. Normal testicles without evidence of torsion. 9 mm left epididymal cyst. 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 Recommend clinical correlation. These results were called by telephone at the time of interpretation on  10/11/2016 at 1:12 pm to Dr. Domenic Moras , who verbally acknowledged these results. He states there is no erythema and no clinical concern for infection at this time. Electronically Signed   By: Marin Olp M.D.   On: 10/11/2016 13:13    Procedures Procedures (including critical care time)  Medications Ordered in ED Medications  potassium chloride SA (K-DUR,KLOR-CON) CR tablet 40 mEq (40 mEq Oral Given 10/11/16 1341)     Initial Impression / Assessment and Plan / ED Course  I have reviewed the triage vital signs and the nursing notes.  Pertinent labs & imaging results that were available during my care of the patient were reviewed by me and considered in my medical decision making (  see chart for details).     BP (!) 158/76   Pulse 71   Temp 98.7 F (37.1 C) (Oral)   Resp 16   Ht 5\' 10"  (1.778 m)   Wt 79.4 kg (175 lb)   SpO2 94%   BMI 25.11 kg/m    Final Clinical Impressions(s) / ED Diagnoses   Final diagnoses:  Left inguinal hernia  Right hydrocele  Cyst of epididymis  Proteinuria, unspecified type  Hypokalemia    New Prescriptions New Prescriptions   No medications on file   10:37 AM Patient here with intermittent left groin pain which has been progressively worse. He had a CT scan of his abdomen and pelvis performed less than 10 days ago without any acute finding but in the document and did note that patient has a fat-containing left inguinal hernia. I suspect that his pain is related to his hernia. It is not incarcerated. He however does report having left testicle pain. He has some enlargement of his epididymal region therefore I will obtain an ultrasound to assess for potential orchitis or epididymitis, although my suspicion is low. Pain medication offered, patient declined. I do not think patient would benefit from another CT scan at this time.  1:24 PM Patient's labs remarkable for hypokalemia with a potassium of 2.8. Will provide supplementation. Urine once  again shows 100 protein similar to prior. No signs of infection. A scrotal ultrasound obtained showing moderate size complex right hydrocele as well as a 9 mm left epididymal cyst which is likely benign. Patient does not have a pain in his right testicle and therefore I do not suspect the hydrocele to be the source of infection. I still believe patient's pain is likely due to his left inguinal hernia. He does not have any signs of strain relation or incarceration on my exam. I will provide pt with a referral to general surgery for further evaluation of his hernia.  Recommend avoid heavy lifting.     Domenic Moras, PA-C 10/11/16 1351    Fredia Sorrow, MD 10/18/16 1620

## 2016-10-11 NOTE — ED Triage Notes (Signed)
Pt. Stated, Manuel Barrett had the same symptoms for a week. I went to [facility] last Sunday night and given an antibiotic, Keflex . Having the same symptoms. I go to pee but have to push it out. Going more than usual. Pain is in left lower abdomen and goes around to my back.

## 2016-10-11 NOTE — ED Notes (Signed)
Pt to US NAD

## 2016-10-11 NOTE — ED Provider Notes (Signed)
Medical screening examination/treatment/procedure(s) were conducted as a shared visit with non-physician practitioner(s) and myself.  I personally evaluated the patient during the encounter.   EKG Interpretation None        Patient seen by me along with physician assistant.   Patient with symptoms of a groin discomfort for about a week.  Patient was seen at [facility] on June 17 similar symptoms. Had CT renal study done without any acute findings. Here today had Doppler studies of the testicle area without any acute findings. There was evidence of a hydrocele on the right. An evidence of an epididymal type cyst on the left side. Cyst possibly could explain patient's pain. Patient has not had a mastectomy. There, and after facetectomy. Patient nontoxic no acute distress. Can be treated symptomatically. If symptoms persist would recommend urology follow-up. Urinalysis  Not consistent with urinary tract infection.  Abdomen soft nontender lungs are clear.  Results for orders placed or performed during the hospital encounter of 10/11/16  Urinalysis, Routine w reflex microscopic  Result Value Ref Range   Color, Urine YELLOW YELLOW   APPearance CLEAR CLEAR   Specific Gravity, Urine 1.015 1.005 - 1.030   pH 6.0 5.0 - 8.0   Glucose, UA NEGATIVE NEGATIVE mg/dL   Hgb urine dipstick NEGATIVE NEGATIVE   Bilirubin Urine NEGATIVE NEGATIVE   Ketones, ur NEGATIVE NEGATIVE mg/dL   Protein, ur 100 (A) NEGATIVE mg/dL   Nitrite NEGATIVE NEGATIVE   Leukocytes, UA NEGATIVE NEGATIVE   RBC / HPF NONE SEEN 0 - 5 RBC/hpf   WBC, UA NONE SEEN 0 - 5 WBC/hpf   Bacteria, UA NONE SEEN NONE SEEN   Squamous Epithelial / LPF NONE SEEN NONE SEEN   Mucous PRESENT   CBC with Differential  Result Value Ref Range   WBC 8.8 4.0 - 10.5 K/uL   RBC 5.14 4.22 - 5.81 MIL/uL   Hemoglobin 15.3 13.0 - 17.0 g/dL   HCT 45.2 39.0 - 52.0 %   MCV 87.9 78.0 - 100.0 fL   MCH 29.8 26.0 - 34.0 pg   MCHC 33.8 30.0 - 36.0 g/dL   RDW  13.1 11.5 - 15.5 %   Platelets 192 150 - 400 K/uL   Neutrophils Relative % 64 %   Neutro Abs 5.6 1.7 - 7.7 K/uL   Lymphocytes Relative 25 %   Lymphs Abs 2.2 0.7 - 4.0 K/uL   Monocytes Relative 10 %   Monocytes Absolute 0.9 0.1 - 1.0 K/uL   Eosinophils Relative 1 %   Eosinophils Absolute 0.1 0.0 - 0.7 K/uL   Basophils Relative 0 %   Basophils Absolute 0.0 0.0 - 0.1 K/uL  Comprehensive metabolic panel  Result Value Ref Range   Sodium 140 135 - 145 mmol/L   Potassium 2.8 (L) 3.5 - 5.1 mmol/L   Chloride 107 101 - 111 mmol/L   CO2 26 22 - 32 mmol/L   Glucose, Bld 92 65 - 99 mg/dL   BUN 15 6 - 20 mg/dL   Creatinine, Ser 1.08 0.61 - 1.24 mg/dL   Calcium 9.1 8.9 - 10.3 mg/dL   Total Protein 5.8 (L) 6.5 - 8.1 g/dL   Albumin 3.4 (L) 3.5 - 5.0 g/dL   AST 31 15 - 41 U/L   ALT 42 17 - 63 U/L   Alkaline Phosphatase 70 38 - 126 U/L   Total Bilirubin 0.7 0.3 - 1.2 mg/dL   GFR calc non Af Amer >60 >60 mL/min   GFR calc Af  Amer >60 >60 mL/min   Anion gap 7 5 - 15  Lipase, blood  Result Value Ref Range   Lipase 36 11 - 51 U/L   US Scrotum  Result Date: 10/11/2016 CLINICAL DATA:  Left testicular and groin pain 2 weeks. EXAM: SCROTAL ULTRASOUND DOPPLER ULTRASOUND OF THE TESTICLES TECHNIQUE: Complete ultrasound examination of the testicles, epididymis, and other scrotal structures was performed. Color and spectral Doppler ultrasound were also utilized to evaluate blood flow to the testicles. COMPARISON:  CT 10/04/2016 FINDINGS: Right testicle Measurements: 2.1 x 3.4 x 4.4 cm. No mass or microlithiasis visualized. Left testicle Measurements: 2.1 x 2.9 x 4.2 cm. No mass or microlithiasis visualized. Right epididymis:  5 mm echogenic focus over the epididymal head. Left epididymis:  9 mm cyst over the epididymal head. Hydrocele: Moderate-size complicated right hydrocele with internal debris and septations. Small simple left hydrocele. Varicocele:  Bilateral varicoceles. Images over the area of pain in  the left groin/inguinal region are unremarkable. Pulsed Doppler interrogation of both testes demonstrates normal low resistance arterial and venous waveforms bilaterally. IMPRESSION: Moderate size complex right hydrocele. This may be a noninfected complex hydrocele. Infection/ pyocele could also have this appearance, but would be unlikely without associated epididymal orchitis or clinical signs of infection. Small simple left hydrocele. If concern for infection, recommend urologic consultation. Normal testicles without evidence of torsion. 9 mm left epididymal cyst. 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 Recommend clinical correlation. These results were called by telephone at the time of interpretation on 10/11/2016 at 1:12 pm to Dr. Domenic Moras , who verbally acknowledged these results. He states there is no erythema and no clinical concern for infection at this time. Electronically Signed   By: Marin Olp M.D.   On: 10/11/2016 13:13   [place] Art/ven Flow Abd Pelv Doppler  Result Date: 10/11/2016 CLINICAL DATA:  Left testicular and groin pain 2 weeks. EXAM: SCROTAL ULTRASOUND DOPPLER ULTRASOUND OF THE TESTICLES TECHNIQUE: Complete ultrasound examination of the testicles, epididymis, and other scrotal structures was performed. Color and spectral Doppler ultrasound were also utilized to evaluate blood flow to the testicles. COMPARISON:  CT 10/04/2016 FINDINGS: Right testicle Measurements: 2.1 x 3.4 x 4.4 cm. No mass or microlithiasis visualized. Left testicle Measurements: 2.1 x 2.9 x 4.2 cm. No mass or microlithiasis visualized. Right epididymis:  5 mm echogenic focus over the epididymal head. Left epididymis:  9 mm cyst over the epididymal head. Hydrocele: Moderate-size complicated right hydrocele with internal debris and septations. Small simple left hydrocele. Varicocele:  Bilateral varicoceles. Images over the area of  pain in the left groin/inguinal region are unremarkable. Pulsed Doppler interrogation of both testes demonstrates normal low resistance arterial and venous waveforms bilaterally. IMPRESSION: Moderate size complex right hydrocele. This may be a noninfected complex hydrocele. Infection/ pyocele could also have this appearance, but would be unlikely without associated epididymal orchitis or clinical signs of infection. Small simple left hydrocele. If concern for infection, recommend urologic consultation. Normal testicles without evidence of torsion. 9 mm left epididymal cyst. 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 Recommend clinical correlation. These results were called by telephone at the time of interpretation on 10/11/2016 at 1:12 pm to Dr. Domenic Moras , who verbally acknowledged these results. He states there is no erythema and no clinical concern for infection at this time.  Electronically Signed   By: Marin Olp M.D.   On: 10/11/2016 13:13   Ct Renal Stone Study  Result Date: 10/04/2016 CLINICAL DATA:  71 year old male with left-sided flank pain. EXAM: CT ABDOMEN AND PELVIS WITHOUT CONTRAST TECHNIQUE: Multidetector CT imaging of the abdomen and pelvis was performed following the standard protocol without IV contrast. COMPARISON:  Abdominal CT dated 07/11/2015 FINDINGS: Evaluation of this exam is limited in the absence of intravenous contrast. Lower chest: There are bibasilar atelectatic changes/ scarring. There is a 4 mm right middle lobe pulmonary nodule (series 2, image 1). There is coronary vascular calcification primarily involving the LAD and RCA. There is no intra-abdominal free air or free fluid. Hepatobiliary: No focal liver abnormality is seen. No gallstones, gallbladder wall thickening, or biliary dilatation. Pancreas: Unremarkable. No pancreatic ductal dilatation or surrounding inflammatory changes. Spleen:  Normal in size without focal abnormality. Adrenals/Urinary Tract: A 1 cm right renal inferior pole hypodense lesion is incompletely characterized on this noncontrast CT but most likely represents a cyst and appears similar to the prior CT. There is no hydronephrosis or nephrolithiasis on either side. The visualized ureters and urinary bladder appear unremarkable. The adrenal glands are unremarkable as well. Stomach/Bowel: Small sigmoid diverticula without active inflammatory changes. There is no evidence of bowel obstruction or active inflammation. The appendix is not visualized with certainty. No inflammatory changes identified in the right lower quadrant. Vascular/Lymphatic: There is aortoiliac atherosclerotic disease. Left aortoiliac stent graft again noted with calcified wall. Evaluation of the vasculature is limited in the absence of intravenous contrast. No portal venous gas identified. There is no adenopathy. Reproductive: The prostate and seminal vesicles are grossly unremarkable. Other: Small fat containing left inguinal hernia. Bilateral inguinal surgical clips noted. Musculoskeletal: There is degenerative changes of the spine. No acute fracture. IMPRESSION: 1. No acute intra-abdominal or pelvic pathology. Specifically there is no hydronephrosis or nephrolithiasis. 2. No bowel obstruction or active inflammation. 3. Bibasilar atelectasis/scarring and a 4 mm right middle lobe pulmonary nodule. 4.  Aortic Atherosclerosis (ICD10-I70.0). Electronically Signed   By: Anner Crete M.D.   On: 10/04/2016 19:44      Fredia Sorrow, MD 10/11/16 1346

## 2016-10-12 ENCOUNTER — Ambulatory Visit: Payer: PPO | Admitting: Physical Therapy

## 2016-10-12 ENCOUNTER — Encounter: Payer: Self-pay | Admitting: Physical Therapy

## 2016-10-12 DIAGNOSIS — M25512 Pain in left shoulder: Secondary | ICD-10-CM

## 2016-10-12 DIAGNOSIS — M25612 Stiffness of left shoulder, not elsewhere classified: Secondary | ICD-10-CM

## 2016-10-12 NOTE — Therapy (Signed)
[facility] [place], [place], [address] Phone: [phone]   Fax:  [phone]  Physical Therapy Treatment  Patient Details  Name: Manuel Barrett MRN: [id number] Date of Birth: February 05, 1946 Referring Provider: Kenn File MD.  Encounter Date: 10/12/2016      PT End of Session - 10/12/16 1405    Visit Number 9   Number of Visits 12   Date for PT Re-Evaluation 10/20/16   PT Start Time 1344   PT Stop Time 1428   PT Time Calculation (min) 44 min   Activity Tolerance Patient tolerated treatment well   Behavior During Therapy [facility] for tasks assessed/performed      Past Medical History:  Diagnosis Date  . CAD in native artery   . GERD (gastroesophageal reflux disease)   . Gout   . Hypertension   . PAD (peripheral artery disease) ([place]) 1994    Past Surgical History:  Procedure Laterality Date  . AORTA - BILATERAL FEMORAL ARTERY BYPASS GRAFT  2001  . BACK SURGERY    . cardiac cath  03/14/14   non obstructive disease  . FEMORAL-POPLITEAL BYPASS GRAFT  1994   Dr. Amedeo Plenty  . I&D EXTREMITY Right 01/09/2015   Procedure: IRRIGATION AND DEBRIDEMENT RIGHT THUMB/HAND ;  Surgeon: Roseanne Kaufman, MD;  Location: [facility];  Service: Orthopedics;  Laterality: Right;  . LEFT HEART CATHETERIZATION WITH CORONARY ANGIOGRAM N/A 03/06/2014   Procedure: LEFT HEART CATHETERIZATION WITH CORONARY ANGIOGRAM;  Surgeon: Peter M Martinique, MD;  Location: [facility] CATH LAB;  Service: Cardiovascular;  Laterality: N/A;    There were no vitals filed for this visit.      Subjective Assessment - 10/12/16 1357    Subjective Patient reported ongoing soreness   Pertinent History Neck surgery.   Patient Stated Goals Get out of pain.   Currently in Pain? Yes   Pain Score 5    Pain Location Shoulder   Pain Orientation Left   Pain Descriptors / Indicators Aching   Pain Onset More than a month ago   Pain Frequency Intermittent   Aggravating Factors  certain  movement   Pain Relieving Factors at rest                         [facility] Adult PT Treatment/Exercise - 10/12/16 0001      Shoulder Exercises: Standing   Protraction Strengthening;Left;Theraband   Theraband Level (Shoulder Protraction) Level 2 (Red)   Protraction Limitations 3x10 reps   External Rotation Strengthening;Left;Theraband  3x10   Theraband Level (Shoulder External Rotation) Level 2 (Red)   Internal Rotation Strengthening;Left;Theraband   Theraband Level (Shoulder Internal Rotation) Level 2 (Red)   Internal Rotation Limitations 3x10 reps   Extension Strengthening;Left;Theraband   Theraband Level (Shoulder Extension) Level 2 (Red)   Extension Limitations 3x10 reps   Row Strengthening;Left;Theraband   Theraband Level (Shoulder Row) Level 2 (Red)   Row Limitations 3x10 reps     Shoulder Exercises: Pulleys   Flexion Other (comment)  14min     Shoulder Exercises: ROM/Strengthening   UBE (Upper Arm Bike) UBE 90 RPM x6 min                     PT Long Term Goals - 09/17/16 0840      PT LONG TERM GOAL #1   Title Independent with a HEP.   Time 6   Period Weeks   Status Achieved     PT  LONG TERM GOAL #2   Title Perform ADL's with pain not > 2-3/10.   Time 6   Period Weeks   Status On-going               Plan - 10/12/16 1408    Clinical Impression Statement Patient tolerated treatment well today. Patient has ongoing pain in left shoulder esp with prolong activity or ADL's up to 5/10. Patient reported doing HEP daily. Patient has good technique with HEP/reviewed today. Patient current goals ongoing due to pain deficts.    Rehab Potential Excellent   PT Frequency 2x / week   PT Duration 6 weeks   PT Treatment/Interventions ADLs/Self Care Home Management;Cryotherapy;Electrical Stimulation;Moist Heat;Ultrasound;Therapeutic exercise;Therapeutic activities;Manual techniques;Passive range of motion;Dry needling   PT Next Visit Plan   Continue with L shoulder strengthening with manual therapy and modalities per MPT POC.    Consulted and Agree with Plan of Care Patient      Patient will benefit from skilled therapeutic intervention in order to improve the following deficits and impairments:  Decreased activity tolerance, Decreased strength, Decreased range of motion, Pain  Visit Diagnosis: Stiffness of left shoulder, not elsewhere classified  Acute pain of left shoulder     Problem List Patient Active Problem List   Diagnosis Date Noted  . CAD in native artery 03/28/2014  . Hyperlipidemia LDL goal <70 03/28/2014  . Hypertension     Ceylin Dreibelbis P, PTA 10/12/2016, 2:39 PM  [facility] [place], [place], [address] Phone: [phone]   Fax:  [phone]  Name: Manuel Barrett MRN: [id number] Date of Birth: 05/18/1945

## 2016-10-14 ENCOUNTER — Ambulatory Visit: Payer: PPO | Admitting: Physical Therapy

## 2016-10-14 DIAGNOSIS — M25512 Pain in left shoulder: Secondary | ICD-10-CM

## 2016-10-14 DIAGNOSIS — M25612 Stiffness of left shoulder, not elsewhere classified: Secondary | ICD-10-CM

## 2016-10-14 NOTE — Therapy (Signed)
[facility] [place], [place], [address] Phone: [phone]   Fax:  [phone]  Physical Therapy Treatment  Patient Details  Name: Manuel Barrett MRN: [id number] Date of Birth: 1945/06/18 Referring Provider: Kenn File MD.  Encounter Date: 10/14/2016      PT End of Session - 10/14/16 1148    Visit Number 10   Number of Visits 12   Date for PT Re-Evaluation 10/20/16   PT Start Time 1115   PT Stop Time 1157   PT Time Calculation (min) 42 min   Activity Tolerance Patient tolerated treatment well   Behavior During Therapy [facility] for tasks assessed/performed      Past Medical History:  Diagnosis Date  . CAD in native artery   . GERD (gastroesophageal reflux disease)   . Gout   . Hypertension   . PAD (peripheral artery disease) ([place]) 1994    Past Surgical History:  Procedure Laterality Date  . AORTA - BILATERAL FEMORAL ARTERY BYPASS GRAFT  2001  . BACK SURGERY    . cardiac cath  03/14/14   non obstructive disease  . FEMORAL-POPLITEAL BYPASS GRAFT  1994   Dr. Amedeo Plenty  . I&D EXTREMITY Right 01/09/2015   Procedure: IRRIGATION AND DEBRIDEMENT RIGHT THUMB/HAND ;  Surgeon: Roseanne Kaufman, MD;  Location: [facility];  Service: Orthopedics;  Laterality: Right;  . LEFT HEART CATHETERIZATION WITH CORONARY ANGIOGRAM N/A 03/06/2014   Procedure: LEFT HEART CATHETERIZATION WITH CORONARY ANGIOGRAM;  Surgeon: Peter M Martinique, MD;  Location: [facility] CATH LAB;  Service: Cardiovascular;  Laterality: N/A;    There were no vitals filed for this visit.      Subjective Assessment - 10/14/16 1123    Subjective Patient reported ongoing soreness and increased today after sleeping on left shoulder   Pertinent History Neck surgery.   Patient Stated Goals Get out of pain.   Currently in Pain? Yes   Pain Score 5    Pain Location Shoulder   Pain Orientation Left   Pain Descriptors / Indicators Aching   Pain Type Acute pain   Pain Onset More than  a month ago   Pain Frequency Intermittent   Aggravating Factors  certain movements   Pain Relieving Factors at rest                         [facility] Adult PT Treatment/Exercise - 10/14/16 0001      Shoulder Exercises: Pulleys   Flexion Other (comment)  75min     Shoulder Exercises: ROM/Strengthening   UBE (Upper Arm Bike) UBE 120 RPM x6 min     Moist Heat Therapy   Number Minutes Moist Heat 15 Minutes   Moist Heat Location Shoulder     Electrical Stimulation   Electrical Stimulation Location L shoulder   Electrical Stimulation Action IFC   Electrical Stimulation Parameters 1-10hz x19min   Electrical Stimulation Goals Pain     Manual Therapy   Manual Therapy Soft tissue mobilization   Soft tissue mobilization to L UT, deltoids and pectorals                     PT Long Term Goals - 09/17/16 0840      PT LONG TERM GOAL #1   Title Independent with a HEP.   Time 6   Period Weeks   Status Achieved     PT LONG TERM GOAL #2   Title Perform ADL's with pain not >  2-3/10.   Time 6   Period Weeks   Status On-going               Plan - 10/14/16 1150    Clinical Impression Statement Patient tolerated treatment well today. Patient has some increased discomfort after sleping on left shoulder. Patient continues to have ongoing pain at 5/10 with prolong activity and ADL's. patient remaining goal ongoing due to pain deficts.    Rehab Potential Excellent   PT Frequency 2x / week   PT Duration 6 weeks   PT Treatment/Interventions ADLs/Self Care Home Management;Cryotherapy;Electrical Stimulation;Moist Heat;Ultrasound;Therapeutic exercise;Therapeutic activities;Manual techniques;Passive range of motion;Dry needling   PT Next Visit Plan  Continue with L shoulder strengthening with manual therapy and modalities per MPT POC. (re-cert sent today to request visits)   Consulted and Agree with Plan of Care Patient      Patient will benefit from skilled  therapeutic intervention in order to improve the following deficits and impairments:  Decreased activity tolerance, Decreased strength, Decreased range of motion, Pain  Visit Diagnosis: Stiffness of left shoulder, not elsewhere classified  Acute pain of left shoulder     Problem List Patient Active Problem List   Diagnosis Date Noted  . CAD in native artery 03/28/2014  . Hyperlipidemia LDL goal <70 03/28/2014  . Hypertension     Ladean Raya, PTA 10/14/16 12:06 PM  [facility] [address] [place], [place], [address] Phone: [phone]   Fax:  [phone]  Name: Manuel Barrett MRN: [id number] Date of Birth: 03/31/1946

## 2016-10-15 ENCOUNTER — Telehealth: Payer: Self-pay | Admitting: Family Medicine

## 2016-10-15 DIAGNOSIS — K4090 Unilateral inguinal hernia, without obstruction or gangrene, not specified as recurrent: Secondary | ICD-10-CM

## 2016-10-15 NOTE — Telephone Encounter (Signed)
Given documentation I owuld lean toward sending him to Gen surg for groin pain.   I would keep urology appt as well.   Laroy Apple, MD [facility] 10/15/2016, 5:09 PM   '

## 2016-10-15 NOTE — Telephone Encounter (Signed)
Pt notified of recommendation Pt is in agreement with plan

## 2016-10-15 NOTE — Telephone Encounter (Signed)
Spoke with wife - he has been back to the ER again They said there was a small hernia in the groin area  - could we refer to gen surg.Or get in with urlogy sooner (has July 11 appt) - [facility] please let me know what route you would prefer.  Wife is worried and anxious about getting him check out -  2 er visits and her once so far.

## 2016-10-15 NOTE — Telephone Encounter (Signed)
What type of referral do you need? urologist  Have you been seen at our office for this problem? yes (If no, schedule them an appointment.  They will need to be seen before a referral can be done.)  Is there a particular doctor or location that you prefer? [facility] but can't see him until July 11. He needs to be seen sooner. Please call.  Patient notified that referrals can take up to a week or longer to process. If they haven't heard anything within a week they should call back and speak with the referral department.

## 2016-10-19 ENCOUNTER — Ambulatory Visit: Payer: PPO | Attending: Family Medicine | Admitting: *Deleted

## 2016-10-19 DIAGNOSIS — M25512 Pain in left shoulder: Secondary | ICD-10-CM

## 2016-10-19 DIAGNOSIS — M25612 Stiffness of left shoulder, not elsewhere classified: Secondary | ICD-10-CM | POA: Diagnosis not present

## 2016-10-19 NOTE — Therapy (Signed)
[facility] [place], [place], [address] Phone: [phone]   Fax:  [phone]  Physical Therapy Treatment  Patient Details  Name: Manuel Barrett MRN: [id number] Date of Birth: 1945/04/27 Referring Provider: Kenn File MD.  Encounter Date: 10/19/2016      PT End of Session - 10/19/16 1401    Visit Number 11   Number of Visits 20   Date for PT Re-Evaluation 11/17/16   PT Start Time 3614   PT Stop Time 1435   PT Time Calculation (min) 50 min      Past Medical History:  Diagnosis Date  . CAD in native artery   . GERD (gastroesophageal reflux disease)   . Gout   . Hypertension   . PAD (peripheral artery disease) ([place]) 1994    Past Surgical History:  Procedure Laterality Date  . AORTA - BILATERAL FEMORAL ARTERY BYPASS GRAFT  2001  . BACK SURGERY    . cardiac cath  03/14/14   non obstructive disease  . FEMORAL-POPLITEAL BYPASS GRAFT  1994   Dr. Amedeo Plenty  . I&D EXTREMITY Right 01/09/2015   Procedure: IRRIGATION AND DEBRIDEMENT RIGHT THUMB/HAND ;  Surgeon: Roseanne Kaufman, MD;  Location: [facility];  Service: Orthopedics;  Laterality: Right;  . LEFT HEART CATHETERIZATION WITH CORONARY ANGIOGRAM N/A 03/06/2014   Procedure: LEFT HEART CATHETERIZATION WITH CORONARY ANGIOGRAM;  Surgeon: Peter M Martinique, MD;  Location: [facility] CATH LAB;  Service: Cardiovascular;  Laterality: N/A;    There were no vitals filed for this visit.      Subjective Assessment - 10/19/16 1348    Subjective Patient reported ongoing soreness and increased today after sleeping on left shoulder. Doing better overall   Pertinent History Neck surgery.   Patient Stated Goals Get out of pain.                         [facility] Adult PT Treatment/Exercise - 10/19/16 0001      Shoulder Exercises: Standing   External Rotation Strengthening;Left;Theraband  4x fatigue   Theraband Level (Shoulder External Rotation) Level 1 (Yellow)   Internal  Rotation Strengthening;Left;Theraband  4  x fatigue   Theraband Level (Shoulder Internal Rotation) Level 1 (Yellow)     Shoulder Exercises: Pulleys   Flexion Other (comment)  34min     Shoulder Exercises: ROM/Strengthening   UBE (Upper Arm Bike) UBE 120 RPM x6 min     Moist Heat Therapy   Number Minutes Moist Heat 15 Minutes   Moist Heat Location Shoulder     Electrical Stimulation   Electrical Stimulation Location L shoulder  IFC 1-10hz  x 15 mins   Electrical Stimulation Goals Pain     Manual Therapy   Manual Therapy Soft tissue mobilization;Passive ROM   Passive ROM PROM all motions LT shldr supine position                     PT Long Term Goals - 09/17/16 0840      PT LONG TERM GOAL #1   Title Independent with a HEP.   Time 6   Period Weeks   Status Achieved     PT LONG TERM GOAL #2   Title Perform ADL's with pain not > 2-3/10.   Time 6   Period Weeks   Status On-going               Plan - 10/19/16 1358    Clinical Impression Statement  Pt doing fairly well today with LT shldr pain doing a little better and about 50% better overall. Increased pain with certain movements especially reaching out. ROM forHBB to L3, ER to 70 degrees   Rehab Potential Excellent   PT Frequency 2x / week   PT Duration 6 weeks   PT Treatment/Interventions ADLs/Self Care Home Management;Cryotherapy;Electrical Stimulation;Moist Heat;Ultrasound;Therapeutic exercise;Therapeutic activities;Manual techniques;Passive range of motion;Dry needling   PT Next Visit Plan  Continue with L shoulder strengthening with manual therapy and modalities per MPT POC. (re-cert sent today to request visits)   Consulted and Agree with Plan of Care Patient      Patient will benefit from skilled therapeutic intervention in order to improve the following deficits and impairments:  Decreased activity tolerance, Decreased strength, Decreased range of motion, Pain  Visit Diagnosis: Stiffness of  left shoulder, not elsewhere classified  Acute pain of left shoulder     Problem List Patient Active Problem List   Diagnosis Date Noted  . CAD in native artery 03/28/2014  . Hyperlipidemia LDL goal <70 03/28/2014  . Hypertension     Topanga Alvelo,CHRIS, PTA 10/19/2016, 2:57 PM  [facility] [address] [place], [place], [address] Phone: [phone]   Fax:  [phone]  Name: Manuel Barrett MRN: [id number] Date of Birth: [birth year]

## 2016-10-26 ENCOUNTER — Ambulatory Visit: Payer: PPO | Admitting: Physical Therapy

## 2016-10-26 ENCOUNTER — Encounter: Payer: Self-pay | Admitting: Physical Therapy

## 2016-10-26 ENCOUNTER — Telehealth: Payer: Self-pay

## 2016-10-26 DIAGNOSIS — M25512 Pain in left shoulder: Secondary | ICD-10-CM

## 2016-10-26 DIAGNOSIS — M25612 Stiffness of left shoulder, not elsewhere classified: Secondary | ICD-10-CM | POA: Diagnosis not present

## 2016-10-26 NOTE — Telephone Encounter (Signed)
I am ok with the extension.    Laroy Apple, MD [facility] 10/26/2016, 11:50 AM

## 2016-10-26 NOTE — Telephone Encounter (Signed)
Re printed work note

## 2016-10-26 NOTE — Telephone Encounter (Signed)
Wife states that therapy wants pt's work note extended until the end of the month. He was supposed to return to work 7/11 but states he still have 12 more apts for his shoulder and wants to return to work when he is done with therapy. Please advise if patient's work note can be extended.

## 2016-10-26 NOTE — Therapy (Signed)
[facility] [place], [place], [address] Phone: [phone]   Fax:  [phone]  Physical Therapy Treatment  Patient Details  Name: Manuel Barrett MRN: [id number] Date of Birth: 10-Jan-1946 Referring Provider: Kenn File MD.  Encounter Date: 10/26/2016      PT End of Session - 10/26/16 1300    Visit Number 12   Number of Visits 20   Date for PT Re-Evaluation 11/17/16   PT Start Time 5732   PT Stop Time 1352   PT Time Calculation (min) 46 min   Activity Tolerance Patient tolerated treatment well   Behavior During Therapy [facility] for tasks assessed/performed      Past Medical History:  Diagnosis Date  . CAD in native artery   . GERD (gastroesophageal reflux disease)   . Gout   . Hypertension   . PAD (peripheral artery disease) ([place]) 1994    Past Surgical History:  Procedure Laterality Date  . AORTA - BILATERAL FEMORAL ARTERY BYPASS GRAFT  2001  . BACK SURGERY    . cardiac cath  03/14/14   non obstructive disease  . FEMORAL-POPLITEAL BYPASS GRAFT  1994   Dr. Amedeo Plenty  . I&D EXTREMITY Right 01/09/2015   Procedure: IRRIGATION AND DEBRIDEMENT RIGHT THUMB/HAND ;  Surgeon: Roseanne Kaufman, MD;  Location: [facility];  Service: Orthopedics;  Laterality: Right;  . LEFT HEART CATHETERIZATION WITH CORONARY ANGIOGRAM N/A 03/06/2014   Procedure: LEFT HEART CATHETERIZATION WITH CORONARY ANGIOGRAM;  Surgeon: Peter M Martinique, MD;  Location: [facility] CATH LAB;  Service: Cardiovascular;  Laterality: N/A;    There were no vitals filed for this visit.      Subjective Assessment - 10/26/16 1300    Subjective Reports that his shoulder is better but isn't the same since strengthening with theraband. Reports greatest difficulty in protraction and pushing.   Pertinent History Neck surgery.   Patient Stated Goals Get out of pain.   Currently in Pain? Yes   Pain Score 2    Pain Location Shoulder   Pain Orientation Left   Pain Descriptors /  Indicators Discomfort   Pain Type Acute pain   Pain Onset More than a month ago            [facility] PT Assessment - 10/26/16 0001      Assessment   Medical Diagnosis Rotator cuff syndrome of left shoulder.   Next MD Visit None     Precautions   Precautions None     Restrictions   Weight Bearing Restrictions No                     [facility] Adult PT Treatment/Exercise - 10/26/16 0001      Shoulder Exercises: Prone   Retraction Strengthening;Left;20 reps;Weights   Retraction Weight (lbs) 3   Extension Strengthening;Left;20 reps;Weights   Extension Weight (lbs) 3     Shoulder Exercises: Standing   Protraction Strengthening;Left;Theraband;10 reps  stopped secondary to posterior L shoulder pain   Theraband Level (Shoulder Protraction) Level 2 (Red)     Shoulder Exercises: Pulleys   Flexion Other (comment)  x5 min     Shoulder Exercises: ROM/Strengthening   UBE (Upper Arm Bike) UBE 90 RPM x5 min   Wall Pushups 20 reps     Modalities   Modalities Electrical Stimulation;Moist Heat;Ultrasound     Moist Heat Therapy   Number Minutes Moist Heat 15 Minutes   Moist Heat Location Shoulder     Electrical Stimulation  Electrical Stimulation Location L shoulder   Electrical Stimulation Action IFC   Electrical Stimulation Parameters 1-10 hz x15 mi   Electrical Stimulation Goals Pain     Ultrasound   Ultrasound Location L psoterior shoulder   Ultrasound Parameters 1.5 w/cm2, 100%, 1 mhz x22min   Ultrasound Goals Pain                     PT Long Term Goals - 09/17/16 0840      PT LONG TERM GOAL #1   Title Independent with a HEP.   Time 6   Period Weeks   Status Achieved     PT LONG TERM GOAL #2   Title Perform ADL's with pain not > 2-3/10.   Time 6   Period Weeks   Status On-going               Plan - 10/26/16 1352    Clinical Impression Statement Patient tolerated today's treatment fair as he had no problems with ROM activities  but reported posterior L shoulder pain with prone extension and with resisted standing protraction. "A little" pain reported with gentle wall pushups today by patient. [place] completed to posterior L shoulder due to pain with patient reporting relief and improved shoulder abduction following treatment. Normal modalities response noted following removal of the modalities.   Rehab Potential Excellent   PT Frequency 2x / week   PT Duration 6 weeks   PT Treatment/Interventions ADLs/Self Care Home Management;Cryotherapy;Electrical Stimulation;Moist Heat;Ultrasound;Therapeutic exercise;Therapeutic activities;Manual techniques;Passive range of motion;Dry needling   PT Next Visit Plan  Continue with L shoulder strengthening with manual therapy and modalities per MPT POC. (re-cert sent today to request visits)   Consulted and Agree with Plan of Care Patient      Patient will benefit from skilled therapeutic intervention in order to improve the following deficits and impairments:  Decreased activity tolerance, Decreased strength, Decreased range of motion, Pain  Visit Diagnosis: Stiffness of left shoulder, not elsewhere classified  Acute pain of left shoulder     Problem List Patient Active Problem List   Diagnosis Date Noted  . CAD in native artery 03/28/2014  . Hyperlipidemia LDL goal <70 03/28/2014  . Hypertension     Wynelle Fanny, PTA 10/26/2016, 2:22 PM  [facility] [address] [place], [place], [address] Phone: [phone]   Fax:  [phone]  Name: Manuel Barrett MRN: [id number] Date of Birth: 12-25-1945

## 2016-10-28 DIAGNOSIS — N430 Encysted hydrocele: Secondary | ICD-10-CM | POA: Diagnosis not present

## 2016-10-28 DIAGNOSIS — R310 Gross hematuria: Secondary | ICD-10-CM | POA: Diagnosis not present

## 2016-10-28 DIAGNOSIS — R1032 Left lower quadrant pain: Secondary | ICD-10-CM | POA: Diagnosis not present

## 2016-10-28 DIAGNOSIS — R800 Isolated proteinuria: Secondary | ICD-10-CM | POA: Diagnosis not present

## 2016-11-02 ENCOUNTER — Ambulatory Visit: Payer: Self-pay | Admitting: Surgery

## 2016-11-02 DIAGNOSIS — K4090 Unilateral inguinal hernia, without obstruction or gangrene, not specified as recurrent: Secondary | ICD-10-CM | POA: Diagnosis not present

## 2016-11-02 NOTE — H&P (Signed)
History of Present Illness Manuel Barrett. Manuel Litzenberger MD; 11/02/2016 11:23 AM) The patient is a 71 year old male who presents with an inguinal hernia. PCP - Wendi Snipes Urology - Jeffie Pollock  This is a 71 year old male who presents with a two-month history of left groin pain. The patient has a job that requires a lot of heavy lifting and straining. About the same time he began noticing pain in his groin, he began having left shoulder problems. He has been out of work since that time and the groin pain has improved. He has noticed a bit of swelling on the left. He was evaluated in the emergency department on 10/04/16 and underwent a noncontrasted CT scan. This did show a small left inguinal hernia. The patient underwent ultrasound of the scrotum that was unremarkable except for a small left epididymal cyst. He has been evaluated by urology who did not feel that he had an acute surgical issue for them. Patient denies any obstructive symptoms. His left shoulder continues to be fairly symptomatic. He has not yet been referred to orthopedic surgeon.  CLINICAL DATA: 71 year old male with left-sided flank pain.  EXAM: CT ABDOMEN AND PELVIS WITHOUT CONTRAST  TECHNIQUE: Multidetector CT imaging of the abdomen and pelvis was performed following the standard protocol without IV contrast.  COMPARISON: Abdominal CT dated 07/11/2015  FINDINGS: Evaluation of this exam is limited in the absence of intravenous contrast.  Lower chest: There are bibasilar atelectatic changes/ scarring. There is a 4 mm right middle lobe pulmonary nodule (series 2, image 1). There is coronary vascular calcification primarily involving the LAD and RCA.  There is no intra-abdominal free air or free fluid.  Hepatobiliary: No focal liver abnormality is seen. No gallstones, gallbladder wall thickening, or biliary dilatation.  Pancreas: Unremarkable. No pancreatic ductal dilatation or surrounding inflammatory changes.  Spleen:  Normal in size without focal abnormality.  Adrenals/Urinary Tract: A 1 cm right renal inferior pole hypodense lesion is incompletely characterized on this noncontrast CT but most likely represents a cyst and appears similar to the prior CT. There is no hydronephrosis or nephrolithiasis on either side. The visualized ureters and urinary bladder appear unremarkable. The adrenal glands are unremarkable as well.  Stomach/Bowel: Small sigmoid diverticula without active inflammatory changes. There is no evidence of bowel obstruction or active inflammation. The appendix is not visualized with certainty. No inflammatory changes identified in the right lower quadrant.  Vascular/Lymphatic: There is aortoiliac atherosclerotic disease. Left aortoiliac stent graft again noted with calcified wall. Evaluation of the vasculature is limited in the absence of intravenous contrast. No portal venous gas identified. There is no adenopathy.  Reproductive: The prostate and seminal vesicles are grossly unremarkable.  Other: Small fat containing left inguinal hernia. Bilateral inguinal surgical clips noted.  Musculoskeletal: There is degenerative changes of the spine. No acute fracture.  IMPRESSION: 1. No acute intra-abdominal or pelvic pathology. Specifically there is no hydronephrosis or nephrolithiasis. 2. No bowel obstruction or active inflammation. 3. Bibasilar atelectasis/scarring and a 4 mm right middle lobe pulmonary nodule. 4. Aortic Atherosclerosis (ICD10-I70.0).   Electronically Signed By: Anner Crete M.D. On: 10/04/2016 19:44  CLINICAL DATA: Left testicular and groin pain 2 weeks.  EXAM: SCROTAL ULTRASOUND  DOPPLER ULTRASOUND OF THE TESTICLES  TECHNIQUE: Complete ultrasound examination of the testicles, epididymis, and other scrotal structures was performed. Color and spectral Doppler ultrasound were also utilized to evaluate blood flow to  the testicles.  COMPARISON: CT 10/04/2016  FINDINGS: Right testicle  Measurements: 2.1 x 3.4 x 4.4 cm.  No mass or microlithiasis visualized.  Left testicle  Measurements: 2.1 x 2.9 x 4.2 cm. No mass or microlithiasis visualized.  Right epididymis: 5 mm echogenic focus over the epididymal head.  Left epididymis: 9 mm cyst over the epididymal head.  Hydrocele: Moderate-size complicated right hydrocele with internal debris and septations. Small simple left hydrocele.  Varicocele: Bilateral varicoceles.  Images over the area of pain in the left groin/inguinal region are unremarkable.  Pulsed Doppler interrogation of both testes demonstrates normal low resistance arterial and venous waveforms bilaterally.  IMPRESSION: Moderate size complex right hydrocele. This may be a noninfected complex hydrocele. Infection/ pyocele could also have this appearance, but would be unlikely without associated epididymal orchitis or clinical signs of infection. Small simple left hydrocele. If concern for infection, recommend urologic consultation.  Normal testicles without evidence of torsion.  9 mm left epididymal cyst. 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 Recommend clinical correlation.  These results were called by telephone at the time of interpretation on 10/11/2016 at 1:12 pm to Dr. Domenic Moras , who verbally acknowledged these results. He states there is no erythema and no clinical concern for infection at this time.   Electronically Signed By: Marin Olp M.D. On: 10/11/2016 13:13   Past Surgical History (Tanisha A. Owens Shark, [place]; 11/02/2016 10:27 AM) Appendectomy  Bypass Surgery for Poor Blood Flow to Legs  Carotid Artery Surgery  Right. multiple Spinal Surgery - Lower Back  Spinal Surgery - Neck   Diagnostic Studies History (Tanisha A. Owens Shark, [place]; 11/02/2016 10:27  AM) Colonoscopy  5-10 years ago  Allergies (Tanisha A. Owens Shark, [place]; 11/02/2016 10:29 AM) Indomethacin *ANALGESICS - ANTI-INFLAMMATORY*  Allergies Reconciled   Medication History (Tanisha A. Owens Shark, RMA; 11/02/2016 10:32 AM) Tylenol (500MG  Capsule, Oral) Active. Allopurinol (100MG  Tablet, Oral) Active. AmLODIPine Besylate (5MG  Tablet, Oral) Active. Keflex (500MG  Capsule, Oral) Active. Colchicine (0.6MG  Tablet, Oral) Active. Omeprazole (20MG  Capsule DR, Oral) Active. Potassium Chloride (20MEQ Tablet ER, Oral) Active. PredniSONE (20MG  Tablet, Oral) Active.  Social History (Tanisha A. Owens Shark, [place]; 11/02/2016 10:27 AM) Alcohol use  Remotely quit alcohol use. Caffeine use  Carbonated beverages, Coffee. No drug use  Tobacco use  Former smoker.  Family History (Tanisha A. Owens Shark, [place]; 11/02/2016 10:27 AM) Bleeding disorder  Daughter. Heart Disease  Father. Hypertension  Father. Respiratory Condition  Mother.  Other Problems (Tanisha A. Owens Shark, [place]; 11/02/2016 10:27 AM) Back Pain  Chronic Obstructive Lung Disease  Gastroesophageal Reflux Disease  High blood pressure  Hypercholesterolemia  Kidney Stone  Vascular Disease     Review of Systems (Tanisha A. Brown RMA; 11/02/2016 10:27 AM) General Present- Weight Loss. Not Present- Appetite Loss, Chills, Fatigue, Fever, Night Sweats and Weight Gain. Skin Not Present- Change in Wart/Mole, Dryness, Hives, Jaundice, New Lesions, Non-Healing Wounds, Rash and Ulcer. HEENT Present- Hearing Loss and Wears glasses/contact lenses. Not Present- Earache, Hoarseness, Nose Bleed, Oral Ulcers, Ringing in the Ears, Seasonal Allergies, Sinus Pain, Sore Throat, Visual Disturbances and Yellow Eyes. Respiratory Not Present- Bloody sputum, Chronic Cough, Difficulty Breathing, Snoring and Wheezing. Breast Not Present- Breast Mass, Breast Pain, Nipple Discharge and Skin Changes. Cardiovascular Present- Leg Cramps. Not Present- Chest Pain,  Difficulty Breathing Lying Down, Palpitations, Rapid Heart Rate, Shortness of Breath and Swelling of Extremities. Gastrointestinal Not Present- Abdominal Pain, Bloating, Bloody Stool, Change in Bowel Habits, Chronic diarrhea, Constipation, Difficulty Swallowing, Excessive gas, Gets full quickly at meals, Hemorrhoids, Indigestion, Nausea, Rectal Pain and  Vomiting. Male Genitourinary Present- Blood in Urine and Change in Urinary Stream. Not Present- Frequency, Impotence, Nocturia, Painful Urination, Urgency and Urine Leakage. Musculoskeletal Present- Back Pain and Joint Pain. Not Present- Joint Stiffness, Muscle Pain, Muscle Weakness and Swelling of Extremities. Neurological Not Present- Decreased Memory, Fainting, Headaches, Numbness, Seizures, Tingling, Tremor, Trouble walking and Weakness. Psychiatric Not Present- Anxiety, Bipolar, Change in Sleep Pattern, Depression, Fearful and Frequent crying. Endocrine Present- Heat Intolerance. Not Present- Cold Intolerance, Excessive Hunger, Hair Changes, Hot flashes and New Diabetes. Hematology Not Present- Blood Thinners, Easy Bruising, Excessive bleeding, Gland problems, HIV and Persistent Infections.  Vitals (Tanisha A. Brown RMA; 11/02/2016 10:28 AM) 11/02/2016 10:27 AM Weight: 172.8 lb Height: 70in Body Surface Area: 1.96 m Body Mass Index: 24.79 kg/m  Temp.: 97.55F  Pulse: 60 (Regular)  P.OX: 98% (Room air) BP: 126/82 (Sitting, Left Arm, Standard)       Physical Exam Rodman Key K. Sansa Alkema MD; 11/02/2016 11:24 AM) The physical exam findings are as follows: Note:WDWN in NAD Eyes: Pupils equal, round; sclera anicteric HENT: Oral mucosa moist; good dentition Neck: No masses palpated, no thyromegaly Lungs: CTA bilaterally; normal respiratory effort CV: Regular rate and rhythm; no murmurs; extremities well-perfused with no edema Abd: +bowel sounds, soft, non-tender, no palpable organomegaly; healed midline incision - no obvious ventral  hernia; GU: bilateral descended testes; no testicular masses; healed bilateral vertical femoral incisions; no palpable fem-fem graft Small left inguinal hernia - reducible Skin: Warm, dry; no sign of jaundice Psychiatric - alert and oriented x 4; calm mood and affect    Assessment & Plan Rodman Key K. Ved Martos MD; 11/02/2016 10:54 AM) INGUINAL HERNIA OF LEFT SIDE WITHOUT OBSTRUCTION OR GANGRENE (K40.90) Current Plans Schedule for Surgery - Left inguinal hernia repair with mesh. The surgical procedure has been discussed with the patient. Potential risks, benefits, alternative treatments, and expected outcomes have been explained. All of the patient's questions at this time have been answered. The likelihood of reaching the patient's treatment goal is good. The patient understand the proposed surgical procedure and wishes to proceed.   Manuel Barrett. Georgette Dover, MD, [facility] Surgery  General/ Trauma Surgery  11/02/2016 11:25 AM

## 2016-11-03 ENCOUNTER — Ambulatory Visit: Payer: PPO | Admitting: Physical Therapy

## 2016-11-03 ENCOUNTER — Encounter: Payer: Self-pay | Admitting: Physical Therapy

## 2016-11-03 DIAGNOSIS — M25512 Pain in left shoulder: Secondary | ICD-10-CM

## 2016-11-03 DIAGNOSIS — M25612 Stiffness of left shoulder, not elsewhere classified: Secondary | ICD-10-CM | POA: Diagnosis not present

## 2016-11-03 NOTE — Therapy (Signed)
[facility] [place], [place], [address] Phone: [phone]   Fax:  [phone]  Physical Therapy Treatment  Patient Details  Name: Manuel Barrett MRN: [id number] Date of Birth: 04/02/1946 Referring Provider: Kenn File MD.  Encounter Date: 11/03/2016      PT End of Session - 11/03/16 1310    Visit Number 13   Number of Visits 20   Date for PT Re-Evaluation 11/17/16   PT Start Time 1302   PT Stop Time 1349   PT Time Calculation (min) 47 min   Activity Tolerance Patient tolerated treatment well   Behavior During Therapy [facility] for tasks assessed/performed      Past Medical History:  Diagnosis Date  . CAD in native artery   . GERD (gastroesophageal reflux disease)   . Gout   . Hypertension   . PAD (peripheral artery disease) ([place]) 1994    Past Surgical History:  Procedure Laterality Date  . AORTA - BILATERAL FEMORAL ARTERY BYPASS GRAFT  2001  . BACK SURGERY    . cardiac cath  03/14/14   non obstructive disease  . FEMORAL-POPLITEAL BYPASS GRAFT  1994   Dr. Amedeo Plenty  . I&D EXTREMITY Right 01/09/2015   Procedure: IRRIGATION AND DEBRIDEMENT RIGHT THUMB/HAND ;  Surgeon: Roseanne Kaufman, MD;  Location: [facility];  Service: Orthopedics;  Laterality: Right;  . LEFT HEART CATHETERIZATION WITH CORONARY ANGIOGRAM N/A 03/06/2014   Procedure: LEFT HEART CATHETERIZATION WITH CORONARY ANGIOGRAM;  Surgeon: Peter M Martinique, MD;  Location: [facility] CATH LAB;  Service: Cardiovascular;  Laterality: N/A;    There were no vitals filed for this visit.      Subjective Assessment - 11/03/16 1305    Subjective Reports that his shoulder isn't getting better and nothing has helped. Recently found out about other health issues as well. Reports that at times his arm will quiver with just picking up a drink.   Pertinent History Neck surgery.   Patient Stated Goals Get out of pain.   Currently in Pain? Yes   Pain Score 2    Pain Location Shoulder   Pain Orientation Left   Pain Descriptors / Indicators Discomfort   Pain Type Acute pain   Pain Onset More than a month ago   Pain Frequency Intermittent            [facility] PT Assessment - 11/03/16 0001      Assessment   Medical Diagnosis Rotator cuff syndrome of left shoulder.   Next MD Visit None     Precautions   Precautions None     Restrictions   Weight Bearing Restrictions No                     [facility] Adult PT Treatment/Exercise - 11/03/16 0001      Shoulder Exercises: Standing   Extension Strengthening;Both   Extension Limitations 3x10 reps Best boy;Both   Row Limitations 3x10 reps Pink XTS     Shoulder Exercises: ROM/Strengthening   UBE (Upper Arm Bike) UBE 90 RPM x5 min   Wall Pushups 20 reps     Modalities   Modalities Electrical Stimulation;Moist Heat;Ultrasound     Moist Heat Therapy   Number Minutes Moist Heat 15 Minutes   Moist Heat Location Shoulder     Electrical Stimulation   Electrical Stimulation Location L shoulder   Electrical Stimulation Action IFC   Electrical Stimulation Parameters 1-10 hz x15 min   Electrical Stimulation Goals  Pain     Ultrasound   Ultrasound Location L posteriolateral shoulder   Ultrasound Parameters 1.5 w/cm2, 100%,1 mhz x12 min   Ultrasound Goals Pain                     PT Long Term Goals - 11/03/16 1343      PT LONG TERM GOAL #1   Title Independent with a HEP.   Time 6   Period Weeks   Status Achieved     PT LONG TERM GOAL #2   Title Perform ADL's with pain not > 2-3/10.   Time 6   Period Weeks   Status Partially Met  Certain movements still cause >3/10 pain 11/03/2016               Plan - 11/03/16 1344    Clinical Impression Statement Patient no longer seeing improvement and has stalled in progress. Patient able to complete posterior shoulder strengthening without complaint. [place] completed to L posteriolateral shoulder to reduce pain as patient  experienced temporary pain relief. Normal modalities response noted following removal of the modalities.   Rehab Potential Excellent   PT Frequency 2x / week   PT Duration 6 weeks   PT Treatment/Interventions ADLs/Self Care Home Management;Cryotherapy;Electrical Stimulation;Moist Heat;Ultrasound;Therapeutic exercise;Therapeutic activities;Manual techniques;Passive range of motion;Dry needling   PT Next Visit Plan D/C summary required.   Consulted and Agree with Plan of Care Patient      Patient will benefit from skilled therapeutic intervention in order to improve the following deficits and impairments:  Decreased activity tolerance, Decreased strength, Decreased range of motion, Pain  Visit Diagnosis: Stiffness of left shoulder, not elsewhere classified  Acute pain of left shoulder     Problem List Patient Active Problem List   Diagnosis Date Noted  . CAD in native artery 03/28/2014  . Hyperlipidemia LDL goal <70 03/28/2014  . Hypertension     Wynelle Fanny, PTA 11/03/2016, 2:16 PM  [facility] [address] [place], [place], [address] Phone: [phone]   Fax:  [phone]  Name: Manuel Barrett MRN: [id number] Date of Birth: [birth year]  PHYSICAL THERAPY DISCHARGE SUMMARY  Visits from Start of Care: 13.  Current functional level related to goals / functional outcomes: See above.   Remaining deficits: Continued left shoulder pain with certain movements.   Education / Equipment: HEP. Plan: Patient agrees to discharge.  Patient goals were partially met. Patient is being discharged due to lack of progress.  ?????         Mali Applegate MPT

## 2016-11-05 DIAGNOSIS — C670 Malignant neoplasm of trigone of bladder: Secondary | ICD-10-CM | POA: Diagnosis not present

## 2016-11-10 ENCOUNTER — Encounter: Payer: Non-veteran care | Admitting: *Deleted

## 2016-11-10 ENCOUNTER — Other Ambulatory Visit: Payer: Self-pay | Admitting: Urology

## 2016-11-11 ENCOUNTER — Telehealth: Payer: Self-pay | Admitting: Family Medicine

## 2016-11-11 NOTE — Telephone Encounter (Signed)
I would recommend discussing with surgeon first.   Laroy Apple, MD [facility] 11/11/2016, 2:42 PM

## 2016-11-11 NOTE — Telephone Encounter (Signed)
Aware.  He was advised to contact surgeon for work excuse.

## 2016-11-11 NOTE — Telephone Encounter (Signed)
Is this ok to do? Please advise and route to pool A

## 2016-11-12 ENCOUNTER — Other Ambulatory Visit: Payer: Self-pay | Admitting: Urology

## 2016-11-13 ENCOUNTER — Encounter (HOSPITAL_BASED_OUTPATIENT_CLINIC_OR_DEPARTMENT_OTHER): Payer: Self-pay | Admitting: *Deleted

## 2016-11-13 NOTE — Progress Notes (Addendum)
NPO AFTER MN.  ARRIVE AT 1595.  NEEDS ISTAT 8. CURRENT EKG IN CHART AND EPIC.  WILL TAKE AM MEDS AND PRILOSEC DOS W/ SIPS OF WATER.

## 2016-11-16 ENCOUNTER — Other Ambulatory Visit: Payer: Self-pay | Admitting: Urology

## 2016-11-16 MED ORDER — SODIUM CHLORIDE 0.9 % IV SOLN: 50.0000 mg | Freq: Once | INTRAVENOUS | Status: DC

## 2016-11-20 ENCOUNTER — Telehealth: Payer: Self-pay | Admitting: Family Medicine

## 2016-11-20 DIAGNOSIS — M75102 Unspecified rotator cuff tear or rupture of left shoulder, not specified as traumatic: Secondary | ICD-10-CM

## 2016-11-20 NOTE — Telephone Encounter (Signed)
I am fine with extending work note, I'm also fine with doing that orthopedic referral, we will have to look on Dr. Alen Bleacher note to see the diagnosis for why he is there.

## 2016-11-23 ENCOUNTER — Encounter: Payer: Self-pay | Admitting: *Deleted

## 2016-11-25 ENCOUNTER — Ambulatory Visit (INDEPENDENT_AMBULATORY_CARE_PROVIDER_SITE_OTHER): Payer: PPO | Admitting: Orthopaedic Surgery

## 2016-11-25 NOTE — H&P (Signed)
CC/HPI: CC: Proteinuria and bladder abnormality.   Hx: Manuel Barrett returns today in f/u for cystosocpy to evaluate CT findings of haziness at the bladder base with a history of gross hematuria. He had proteinuria on his initial UA and a 24 hour urine was collected. His total protein is elevated at 938mg  but he has a normal CrCl. He has been seen by Dr. Mosetta Pigeon and is felt to have a left inguinal hernia which he is to potentially schedule in August. He has a small right hydrocele.     ALLERGIES: indomethacin - epistaxis    MEDICATIONS: Allopurinol 100 mg tablet  Aspirin 325 mg tablet  Bactrim Ds 800 mg-160 mg tablet 1 tablet PO BID  Metoprolol Tartrate 50 mg tablet  Omeprazole 20 mg capsule,delayed release  Amlodipine Besylate 10 mg tablet  Atorvastatin Calcium 80 mg tablet  Tylenol     GU PSH: None     PSH Notes: femoral bypass 1994, 2000   NON-GU PSH: Appendectomy (open) Back surgery    GU PMH: Gross hematuria, He had hematuria about 2-3 months ago and is a former smoker. The base of the bladder is hazy on CT and I will have him return for cystoscopy. - 10/28/2016 Hydrocele, The hydrocele is small and not associated with tenderness. It doesn't need therapy. - 10/28/2016 LLQ pain, He has left LQ/groin pain that is medial to his prior surgical incision without significant bulge. It may be a strain from his recent fall but he will f/u with general surgery as planned. - 10/28/2016 Proteinuria, He has 3+ protein on the dip UA but Cr of 1.08 and no edema. I will get a 24 hour urine for protein and CrCl. - 10/28/2016 Renal calculus      PMH Notes: hydrocele, proteinuria   NON-GU PMH: Arthritis GERD Gout Hypercholesterolemia Hypertension    FAMILY HISTORY: Congestive Heart Failure - Father Prostate Cancer - Grandfather   SOCIAL HISTORY: Marital Status: Married Current Smoking Status: Patient does not smoke anymore. Has not smoked since 10/18/1992. Smoked for 29 years.   Tobacco Use  Assessment Completed: Used Tobacco in last 30 days? Has never drank.  Drinks 4+ caffeinated drinks per day. Patient's occupation Doctor, general practice.     Notes: 2 sons 1 daughter   REVIEW OF SYSTEMS:    GU Review Male:   Patient reports get up at night to urinate. Patient denies frequent urination, hard to postpone urination, burning/ pain with urination, leakage of urine, stream starts and stops, trouble starting your stream, have to strain to urinate , erection problems, and penile pain.  Gastrointestinal (Upper):   Patient denies nausea, vomiting, and indigestion/ heartburn.  Gastrointestinal (Lower):   Patient denies diarrhea and constipation.  Constitutional:   Patient denies fever, night sweats, weight loss, and fatigue.  Skin:   Patient denies skin rash/ lesion and itching.  Eyes:   Patient denies blurred vision and double vision.  Ears/ Nose/ Throat:   Patient denies sore throat and sinus problems.  Hematologic/Lymphatic:   Patient denies swollen glands and easy bruising.  Cardiovascular:   Patient denies leg swelling and chest pains.  Respiratory:   Patient denies cough and shortness of breath.  Endocrine:   Patient denies excessive thirst.  Musculoskeletal:   Patient reports back pain. Patient denies joint pain.  Neurological:   Patient denies headaches and dizziness.  Psychologic:   Patient denies depression and anxiety.   VITAL SIGNS: None   PAST DATA REVIEWED:  Source Of History:  Patient  Urine Test Review:   24 Hour Urine   PROCEDURES:         Flexible Cystoscopy - 52000  Risks, benefits, and some of the potential complications of the procedure were discussed. 8ml of 2% lidocaine jelly was instilled intraurethrally.  Cipro 500mg  given for antibiotic prophylaxis.     Meatus:  Normal size. Normal location. Normal condition.  Urethra:  No strictures.  External Sphincter:  Normal.  Verumontanum:  Normal.  Prostate:  Mild hyperplasia. Non-obstructing.  Bladder  Neck:  Non-obstructing.  Ureteral Orifices:  Right has Normal location. Normal size. Normal shape. Effluxed clear urine. Left is obscured by tumor.  Bladder:  Mild trabeculation. A left trigone tumor. 3 cm tumor. Solitary tumor. Normal mucosa. No stones.      The procedure was well tolerated and there were no complications.         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 I am going to get him set up for a TURBT with possible left ureteral stent and possible Epirubicin instillation. I have reviewed the risks of bleeding, infection, ureteral or bladder injury, chemical cystitis, need for a stent or restaging, thrombotic events or anesthetic complications.   2   Proteinuria - R80.0 He has abnormal but non-nephrotic range proteinuria and will be referred to nephrology for further evaluation.      PLAN:           Schedule Return Visit/Planned Activity: Next Available Appointment - Schedule Surgery          Document Letter(s):  Created for Patient: Clinical Summary         Notes:   He will need to hold off of the hernia repair until we deal with the bladder cancer.

## 2016-11-26 ENCOUNTER — Encounter (HOSPITAL_BASED_OUTPATIENT_CLINIC_OR_DEPARTMENT_OTHER): Payer: Self-pay

## 2016-11-26 ENCOUNTER — Ambulatory Visit (HOSPITAL_BASED_OUTPATIENT_CLINIC_OR_DEPARTMENT_OTHER): Payer: PPO | Admitting: Anesthesiology

## 2016-11-26 ENCOUNTER — Encounter (HOSPITAL_BASED_OUTPATIENT_CLINIC_OR_DEPARTMENT_OTHER)
Admission: RE | Disposition: A | Discharge status: Home / Self Care | Payer: Self-pay | Source: Ambulatory Visit | Attending: Urology

## 2016-11-26 ENCOUNTER — Ambulatory Visit (HOSPITAL_BASED_OUTPATIENT_CLINIC_OR_DEPARTMENT_OTHER)
Admission: RE | Admit: 2016-11-26 | Discharge: 2016-11-26 | Disposition: A | Discharge status: Home / Self Care | Payer: PPO | Source: Ambulatory Visit | Attending: Urology | Admitting: Urology

## 2016-11-26 DIAGNOSIS — K219 Gastro-esophageal reflux disease without esophagitis: Secondary | ICD-10-CM | POA: Diagnosis not present

## 2016-11-26 DIAGNOSIS — C672 Malignant neoplasm of lateral wall of bladder: Secondary | ICD-10-CM | POA: Diagnosis not present

## 2016-11-26 DIAGNOSIS — Z87891 Personal history of nicotine dependence: Secondary | ICD-10-CM | POA: Insufficient documentation

## 2016-11-26 DIAGNOSIS — Z7982 Long term (current) use of aspirin: Secondary | ICD-10-CM | POA: Insufficient documentation

## 2016-11-26 DIAGNOSIS — J449 Chronic obstructive pulmonary disease, unspecified: Secondary | ICD-10-CM | POA: Insufficient documentation

## 2016-11-26 DIAGNOSIS — I10 Essential (primary) hypertension: Secondary | ICD-10-CM | POA: Insufficient documentation

## 2016-11-26 DIAGNOSIS — I2510 Atherosclerotic heart disease of native coronary artery without angina pectoris: Secondary | ICD-10-CM | POA: Diagnosis not present

## 2016-11-26 DIAGNOSIS — E7800 Pure hypercholesterolemia, unspecified: Secondary | ICD-10-CM | POA: Diagnosis not present

## 2016-11-26 DIAGNOSIS — C670 Malignant neoplasm of trigone of bladder: Secondary | ICD-10-CM | POA: Insufficient documentation

## 2016-11-26 DIAGNOSIS — D494 Neoplasm of unspecified behavior of bladder: Secondary | ICD-10-CM | POA: Diagnosis not present

## 2016-11-26 DIAGNOSIS — E785 Hyperlipidemia, unspecified: Secondary | ICD-10-CM | POA: Diagnosis not present

## 2016-11-26 HISTORY — PX: TRANSURETHRAL RESECTION OF BLADDER TUMOR: SHX2575

## 2016-11-26 HISTORY — DX: Personal history of urinary calculi: Z87.442

## 2016-11-26 HISTORY — DX: Emphysema, unspecified: J43.9

## 2016-11-26 HISTORY — DX: Personal history of Methicillin resistant Staphylococcus aureus infection: Z86.14

## 2016-11-26 HISTORY — DX: Neoplasm of unspecified behavior of bladder: D49.4

## 2016-11-26 HISTORY — DX: Unilateral inguinal hernia, without obstruction or gangrene, not specified as recurrent: K40.90

## 2016-11-26 HISTORY — PX: CYSTOSCOPY WITH STENT PLACEMENT: SHX5790

## 2016-11-26 HISTORY — DX: Unspecified rotator cuff tear or rupture of left shoulder, not specified as traumatic: M75.102

## 2016-11-26 HISTORY — DX: Presence of external hearing-aid: Z97.4

## 2016-11-26 LAB — POCT I-STAT, CHEM 8
BUN: 17 mg/dL (ref 6–20)
CHLORIDE: 105 mmol/L (ref 101–111)
CREATININE: 0.9 mg/dL (ref 0.61–1.24)
Calcium, Ion: 1.3 mmol/L (ref 1.15–1.40)
GLUCOSE: 103 mg/dL — AB (ref 65–99)
HCT: 45 % (ref 39.0–52.0)
HEMOGLOBIN: 15.3 g/dL (ref 13.0–17.0)
POTASSIUM: 4 mmol/L (ref 3.5–5.1)
Sodium: 143 mmol/L (ref 135–145)
TCO2: 26 mmol/L (ref 0–100)

## 2016-11-26 SURGERY — TURBT (TRANSURETHRAL RESECTION OF BLADDER TUMOR)
Anesthesia: General

## 2016-11-26 MED ORDER — CEFAZOLIN SODIUM-DEXTROSE 2-4 GM/100ML-% IV SOLN
2.0000 g | INTRAVENOUS | Status: AC
  Administered 2016-11-26: 2 g via INTRAVENOUS
  Filled 2016-11-26: qty 100

## 2016-11-26 MED ORDER — METOCLOPRAMIDE HCL 5 MG/ML IJ SOLN
10.0000 mg | Freq: Once | INTRAMUSCULAR | Status: DC | PRN
  Filled 2016-11-26: qty 2

## 2016-11-26 MED ORDER — ACETAMINOPHEN 325 MG PO TABS
650.0000 mg | ORAL_TABLET | ORAL | Status: DC | PRN
  Filled 2016-11-26: qty 2

## 2016-11-26 MED ORDER — MEPERIDINE HCL 25 MG/ML IJ SOLN
6.2500 mg | INTRAMUSCULAR | Status: DC | PRN
  Filled 2016-11-26: qty 1

## 2016-11-26 MED ORDER — EPHEDRINE SULFATE-NACL 50-0.9 MG/10ML-% IV SOSY
PREFILLED_SYRINGE | INTRAVENOUS | Status: DC | PRN
Start: 2016-11-26 — End: 2016-11-26
  Administered 2016-11-26: 15 mg via INTRAVENOUS
  Administered 2016-11-26: 10 mg via INTRAVENOUS

## 2016-11-26 MED ORDER — DEXAMETHASONE SODIUM PHOSPHATE 4 MG/ML IJ SOLN
INTRAMUSCULAR | Status: DC | PRN
  Administered 2016-11-26: 10 mg via INTRAVENOUS

## 2016-11-26 MED ORDER — PROPOFOL 10 MG/ML IV BOLUS
INTRAVENOUS | Status: DC | PRN
  Administered 2016-11-26: 180 mg via INTRAVENOUS

## 2016-11-26 MED ORDER — LIDOCAINE 2% (20 MG/ML) 5 ML SYRINGE
INTRAMUSCULAR | Status: DC | PRN
  Administered 2016-11-26: 80 mg via INTRAVENOUS

## 2016-11-26 MED ORDER — MIDAZOLAM HCL 5 MG/5ML IJ SOLN
INTRAMUSCULAR | Status: DC | PRN
  Administered 2016-11-26: 2 mg via INTRAVENOUS

## 2016-11-26 MED ORDER — OXYBUTYNIN CHLORIDE 5 MG PO TABS: 5.0000 mg | ORAL_TABLET | Freq: Three times a day (TID) | ORAL | 1 refills | Status: DC | PRN

## 2016-11-26 MED ORDER — ONDANSETRON HCL 4 MG/2ML IJ SOLN
INTRAMUSCULAR | Status: DC | PRN
  Administered 2016-11-26: 4 mg via INTRAVENOUS

## 2016-11-26 MED ORDER — ROCURONIUM BROMIDE 100 MG/10ML IV SOLN
INTRAVENOUS | Status: DC | PRN
  Administered 2016-11-26: 50 mg via INTRAVENOUS

## 2016-11-26 MED ORDER — MORPHINE SULFATE (PF) 2 MG/ML IV SOLN
2.0000 mg | INTRAVENOUS | Status: DC | PRN
  Filled 2016-11-26: qty 1

## 2016-11-26 MED ORDER — ALFUZOSIN HCL ER 10 MG PO TB24: 10.0000 mg | ORAL_TABLET | Freq: Every day | ORAL | 1 refills | Status: DC

## 2016-11-26 MED ORDER — SODIUM CHLORIDE 0.9% FLUSH
3.0000 mL | Freq: Two times a day (BID) | INTRAVENOUS | Status: DC
  Filled 2016-11-26: qty 3

## 2016-11-26 MED ORDER — SODIUM CHLORIDE 0.9 % IV SOLN
250.0000 mL | INTRAVENOUS | Status: DC | PRN
  Filled 2016-11-26: qty 250

## 2016-11-26 MED ORDER — OXYCODONE HCL 5 MG PO TABS
5.0000 mg | ORAL_TABLET | ORAL | Status: DC | PRN
  Filled 2016-11-26: qty 2

## 2016-11-26 MED ORDER — LACTATED RINGERS IV SOLN
INTRAVENOUS | Status: DC
  Administered 2016-11-26 (×2): via INTRAVENOUS
  Filled 2016-11-26: qty 1000

## 2016-11-26 MED ORDER — SUGAMMADEX SODIUM 200 MG/2ML IV SOLN
INTRAVENOUS | Status: DC | PRN
  Administered 2016-11-26: 400 mg via INTRAVENOUS

## 2016-11-26 MED ORDER — FENTANYL CITRATE (PF) 100 MCG/2ML IJ SOLN
INTRAMUSCULAR | Status: DC | PRN
  Administered 2016-11-26: 50 ug via INTRAVENOUS

## 2016-11-26 MED ORDER — CEFAZOLIN SODIUM-DEXTROSE 2-4 GM/100ML-% IV SOLN
INTRAVENOUS | Status: AC
  Filled 2016-11-26: qty 100

## 2016-11-26 MED ORDER — FENTANYL CITRATE (PF) 100 MCG/2ML IJ SOLN
25.0000 ug | INTRAMUSCULAR | Status: DC | PRN
  Filled 2016-11-26: qty 1

## 2016-11-26 MED ORDER — ACETAMINOPHEN 650 MG RE SUPP
650.0000 mg | RECTAL | Status: DC | PRN
  Filled 2016-11-26: qty 1

## 2016-11-26 MED ORDER — SODIUM CHLORIDE 0.9% FLUSH
3.0000 mL | INTRAVENOUS | Status: DC | PRN
  Filled 2016-11-26: qty 3

## 2016-11-26 MED ORDER — HYDROCODONE-ACETAMINOPHEN 5-325 MG PO TABS: 1.0000 | ORAL_TABLET | Freq: Four times a day (QID) | ORAL | 0 refills | Status: DC | PRN

## 2016-11-26 SURGICAL SUPPLY — 34 items
BAG DRAIN URO-CYSTO SKYTR STRL (DRAIN) ×4 IMPLANT
BAG DRN ANRFLXCHMBR STRAP LEK (BAG) ×2
BAG DRN UROCATH (DRAIN) ×2
BAG URINE DRAINAGE (UROLOGICAL SUPPLIES) IMPLANT
BAG URINE LEG 19OZ MD ST LTX (BAG) ×2 IMPLANT
CATH FOLEY 2WAY SLVR  5CC 20FR (CATHETERS) ×2
CATH FOLEY 2WAY SLVR  5CC 22FR (CATHETERS)
CATH FOLEY 2WAY SLVR 5CC 20FR (CATHETERS) IMPLANT
CATH FOLEY 2WAY SLVR 5CC 22FR (CATHETERS) IMPLANT
CATH URET 5FR 28IN CONE TIP (BALLOONS)
CATH URET 5FR 28IN OPEN ENDED (CATHETERS) ×4 IMPLANT
CATH URET 5FR 70CM CONE TIP (BALLOONS) IMPLANT
CLOTH BEACON ORANGE TIMEOUT ST (SAFETY) ×4 IMPLANT
ELECT REM PT RETURN 9FT ADLT (ELECTROSURGICAL) ×4
ELECTRODE REM PT RTRN 9FT ADLT (ELECTROSURGICAL) ×2 IMPLANT
GLOVE SURG SS PI 8.0 STRL IVOR (GLOVE) ×4 IMPLANT
GOWN STRL REUS W/ TWL LRG LVL3 (GOWN DISPOSABLE) ×2 IMPLANT
GOWN STRL REUS W/ TWL XL LVL3 (GOWN DISPOSABLE) ×2 IMPLANT
GOWN STRL REUS W/TWL LRG LVL3 (GOWN DISPOSABLE) ×4
GOWN STRL REUS W/TWL XL LVL3 (GOWN DISPOSABLE) ×8 IMPLANT
GUIDEWIRE 0.038 PTFE COATED (WIRE) IMPLANT
GUIDEWIRE ANG ZIPWIRE 038X150 (WIRE) IMPLANT
GUIDEWIRE STR DUAL SENSOR (WIRE) ×4 IMPLANT
HOLDER FOLEY CATH W/STRAP (MISCELLANEOUS) ×2 IMPLANT
KIT RM TURNOVER CYSTO AR (KITS) ×4 IMPLANT
LOOP CUT BIPOLAR 24F LRG (ELECTROSURGICAL) ×2 IMPLANT
MANIFOLD NEPTUNE II (INSTRUMENTS) ×2 IMPLANT
NS IRRIG 500ML POUR BTL (IV SOLUTION) ×2 IMPLANT
PACK CYSTO (CUSTOM PROCEDURE TRAY) ×4 IMPLANT
PLUG CATH AND CAP STER (CATHETERS) IMPLANT
STENT URET 6FRX26 CONTOUR (STENTS) ×2 IMPLANT
SYRINGE IRR TOOMEY STRL 70CC (SYRINGE) IMPLANT
TUBE CONNECTING 12'X1/4 (SUCTIONS) ×1
TUBE CONNECTING 12X1/4 (SUCTIONS) ×1 IMPLANT

## 2016-11-26 NOTE — Anesthesia Procedure Notes (Signed)
Procedure Name: Intubation Date/Time: 11/26/2016 9:52 AM Performed by: Wanita Chamberlain Pre-anesthesia Checklist: Patient being monitored, Suction available, Patient identified, Timeout performed and Emergency Drugs available Patient Re-evaluated:Patient Re-evaluated prior to induction Oxygen Delivery Method: Circle system utilized Preoxygenation: Pre-oxygenation with 100% oxygen Induction Type: IV induction Ventilation: Mask ventilation without difficulty Laryngoscope Size: Mac and 3 Grade View: Grade II Tube type: Oral Number of attempts: 1 Airway Equipment and Method: Stylet Placement Confirmation: CO2 detector,  breath sounds checked- equal and bilateral and ETT inserted through vocal cords under direct vision Secured at: 22 cm Tube secured with: Tape Dental Injury: Teeth and Oropharynx as per pre-operative assessment

## 2016-11-26 NOTE — Anesthesia Postprocedure Evaluation (Signed)
Anesthesia Post Note  Patient: ROSCOE WITTS  Procedure(s) Performed: Procedure(s) (LRB): TRANSURETHRAL RESECTION OF BLADDER TUMOR (TURBT)/ left ureteroscopy (N/A) CYSTOSCOPY WITH left double J STENT PLACEMENT (Left)     Patient location during evaluation: PACU Anesthesia Type: General Level of consciousness: awake and alert Pain management: pain level controlled Vital Signs Assessment: post-procedure vital signs reviewed and stable Respiratory status: spontaneous breathing, nonlabored ventilation, respiratory function stable and patient connected to nasal cannula oxygen Cardiovascular status: blood pressure returned to baseline and stable Postop Assessment: no signs of nausea or vomiting Anesthetic complications: no    Last Vitals:  Vitals:   11/26/16 1130 11/26/16 1153  BP: 130/78 135/61  Pulse: 69 67  Resp: 15 20  Temp:  36.7 C  SpO2: 93% 96%    Last Pain:  Vitals:   11/26/16 0811  TempSrc: Oral                 Montez Hageman

## 2016-11-26 NOTE — Brief Op Note (Signed)
11/26/2016  10:19 AM  PATIENT:  Manuel Barrett  71 y.o. male  PRE-OPERATIVE DIAGNOSIS:  LEFT TRIGONE BLADDER TUMOR  POST-OPERATIVE DIAGNOSIS:  LEFT TRIGONE BLADDER TUMOR  PROCEDURE:  Procedure(s): TRANSURETHRAL RESECTION OF BLADDER TUMOR (TURBT)/ left ureteroscopy (N/A) CYSTOSCOPY WITH left double J STENT PLACEMENT (Left)  SURGEON:  Surgeon(s) and Role:    [name], MD - Primary  PHYSICIAN ASSISTANT:   ASSISTANTS: none   ANESTHESIA:   general  EBL:  No intake/output data recorded.  BLOOD ADMINISTERED:none  DRAINS: Urinary Catheter (Foley) and 6 x 26 left JJ stent   LOCAL MEDICATIONS USED:  NONE  SPECIMEN:  Source of Specimen:  left trigone and lateral wall tumor chips.   DISPOSITION OF SPECIMEN:  PATHOLOGY  COUNTS:  YES  TOURNIQUET:  * No tourniquets in log *  DICTATION: .Other Dictation: Dictation Number [id number]  PLAN OF CARE: Discharge to home after PACU  PATIENT DISPOSITION:  PACU - hemodynamically stable.   Delay start of Pharmacological VTE agent (>24hrs) due to surgical blood loss or risk of bleeding: yes

## 2016-11-26 NOTE — Op Note (Signed)
Manuel Barrett, Manuel Barrett NO.:  [id number]  MEDICAL RECORD NO.:  [id number]  LOCATION:                                 FACILITY:  PHYSICIAN:  Marshall Cork. Jeffie Pollock, M.D.    DATE OF BIRTH:  [birth year]  DATE OF PROCEDURE:  11/26/2016 DATE OF DISCHARGE:                              OPERATIVE REPORT   PROCEDURE: 1. Cystoscopy. 2. Left ureteroscopy. 3. Transurethral resection of medium bladder tumor from left lateral     wall and trigone. 4. Insertion of left double-J stent.  PREOPERATIVE DIAGNOSIS:  Left trigone bladder tumor.  POSTOPERATIVE DIAGNOSIS:  Left trigone and lateral wall bladder tumor with involvement of the ureteral orifice.  SURGEON:  Marshall Cork. Jeffie Pollock, M.D.  ANESTHESIA:  General.  SPECIMEN:  Bladder tumor chips.  DRAINS:  A 6-French x 26 cm left double-J stent and 20-French Foley catheter.  BLOOD LOSS:  Minimal.  COMPLICATIONS:  None.  INDICATIONS:  Manuel Barrett is a 71 year old white male who was found on evaluation for hematuria to have a bladder tumor involving the left trigone.  It was felt that cystoscopy with transurethral resection of the tumor, possible stent and possible epirubicin installation were indicated.  FINDINGS AND PROCEDURE:  He was taken to the operating room where he was given Ancef.  A general anesthetic was induced.  He was given paralytic. He was placed in lithotomy position and fitted with PAS hose.  Perineum and genitalia were prepped with Betadine solution and he was draped in usual sterile fashion. Cystoscopy was performed using the 23-French scope and 30-degree lens. Examination revealed a normal urethra.  The external sphincter was intact.  The prostatic urethra was approximately 2 cm in length with trilobar hyperplasia with only a small middle lobe and mild obstruction.  Examination of bladder revealed mild trabeculation.  The right ureteral orifice was unremarkable.  There was a tumor involving the left trigone and  lateral wall measured approximately 4 x 3 cm in entirety.  There was some flat tumor in addition to papillary fronds with tumor that appeared to go within the ureteral orifice.  At this point, the 4.5-French semi-rigid ureteral scope was exchanged for the cystoscope and the distal ureter was inspected.  The involvement of the ureter was confined to the ureteral meatus, but it was felt the resection of the meatus was indicated.  The ureteroscope was removed and a 28-French continuous flow resectoscope sheath was placed with the aid of the visual obturator, was fitted with an Beatrix Fetters handle with a bipolar loop and 30-degree lens. Saline was used as the irrigant.  The tumor was then resected in its entirety including the ureteral orifice.  Resection was carried down to the fat in the area of the larger papillary growth.  Once resection was complete, the bladder was evacuated free of chips and hemostasis was achieved.  The resectoscope was then exchanged for the cystoscope and a Sensor guidewire was then passed up the left ureteral orifice without difficulty to the kidney.  A 6-French 26 cm Contour double-J stent was then inserted over the wire under fluoroscopic guidance.  The wire was loop removed leaving good coil in the  kidney, a good coil in the bladder.  The cystoscope was then removed and a 20-French Foley catheter was inserted.  The balloon was filled with 10 mL of sterile fluid.  Because of the need for stent, I will not give epirubicin today.  He will likely need BCG based on the flat tumor as well.     Marshall Cork. Jeffie Pollock, M.D.   ______________________________ Marshall Cork. Jeffie Pollock, M.D.    JJW/MEDQ  D:  11/26/2016  T:  11/26/2016  Job:  [id number]

## 2016-11-26 NOTE — Discharge Instructions (Addendum)
Ureteral Stent Implantation, Care After °Refer to this sheet in the next few weeks. These instructions provide you with information about caring for yourself after your procedure. Your health care provider may also give you more specific instructions. Your treatment has been planned according to current medical practices, but problems sometimes occur. Call your health care provider if you have any problems or questions after your procedure. °What can I expect after the procedure? °After the procedure, it is common to have: °· Nausea. °· Mild pain when you urinate. You may feel this pain in your lower back or lower abdomen. Pain should stop within a few minutes after you urinate. This may last for up to 1 week. °· A small amount of blood in your urine for several days. ° °Follow these instructions at home: ° °Medicines °· Take over-the-counter and prescription medicines only as told by your health care provider. °· If you were prescribed an antibiotic medicine, take it as told by your health care provider. Do not stop taking the antibiotic even if you start to feel better. °· Do not drive for 24 hours if you received a sedative. °· Do not drive or operate heavy machinery while taking prescription pain medicines. °Activity °· Return to your normal activities as told by your health care provider. Ask your health care provider what activities are safe for you. °· Do not lift anything that is heavier than 10 lb (4.5 kg). Follow this limit for 1 week after your procedure, or for as long as told by your health care provider. °General instructions °· Watch for any blood in your urine. Call your health care provider if the amount of blood in your urine increases. °· If you have a catheter: °? Follow instructions from your health care provider about taking care of your catheter and collection bag. °? Do not take baths, swim, or use a hot tub until your health care provider approves. °· Drink enough fluid to keep your urine  clear or pale yellow. °· Keep all follow-up visits as told by your health care provider. This is important. °Contact a health care provider if: °· You have pain that gets worse or does not get better with medicine, especially pain when you urinate. °· You have difficulty urinating. °· You feel nauseous or you vomit repeatedly during a period of more than 2 days after the procedure. °Get help right away if: °· Your urine is dark red or has blood clots in it. °· You are leaking urine (have incontinence). °· The end of the stent comes out of your urethra. °· You cannot urinate. °· You have sudden, sharp, or severe pain in your abdomen or lower back. °· You have a fever. °This information is not intended to replace advice given to you by your health care provider. Make sure you discuss any questions you have with your health care provider. °Document Released: 12/07/2012 Document Revised: 09/12/2015 Document Reviewed: 10/19/2014 °Elsevier Interactive Patient Education © 2018 [employer] ° ° °Post Anesthesia Home Care Instructions ° °Activity: °Get plenty of rest for the remainder of the day. A responsible individual must stay with you for 24 hours following the procedure.  °For the next 24 hours, DO NOT: °-Drive a car °-Operate machinery °-Drink alcoholic beverages °-Take any medication unless instructed by your physician °-Make any legal decisions or sign important papers. ° °Meals: °Start with liquid foods such as gelatin or soup. Progress to regular foods as tolerated. Avoid greasy, spicy, heavy foods. If nausea   and/or vomiting occur, drink only clear liquids until the nausea and/or vomiting subsides. Call your physician if vomiting continues. ° °Special Instructions/Symptoms: °Your throat may feel dry or sore from the anesthesia or the breathing tube placed in your throat during surgery. If this causes discomfort, gargle with warm salt water. The discomfort should disappear within 24 hours. ° °If you had a  scopolamine patch placed behind your ear for the management of post- operative nausea and/or vomiting: ° °1. The medication in the patch is effective for 72 hours, after which it should be removed.  Wrap patch in a tissue and discard in the trash. Wash hands thoroughly with soap and water. °2. You may remove the patch earlier than 72 hours if you experience unpleasant side effects which may include dry mouth, dizziness or visual disturbances. °3. Avoid touching the patch. Wash your hands with soap and water after contact with the patch. °  ° °

## 2016-11-26 NOTE — Op Note (Deleted)
  The note originally documented on this encounter has been moved the the encounter in which it belongs.  

## 2016-11-26 NOTE — Transfer of Care (Signed)
Immediate Anesthesia Transfer of Care Note  Patient: Manuel Barrett  Procedure(s) Performed: Procedure(s): TRANSURETHRAL RESECTION OF BLADDER TUMOR (TURBT)/ left ureteroscopy (N/A) CYSTOSCOPY WITH left double J STENT PLACEMENT (Left)  Patient Location: PACU  Anesthesia Type:General  Level of Consciousness: awake, alert , oriented and patient cooperative  Airway & Oxygen Therapy: Patient Spontanous Breathing and Patient connected to nasal cannula oxygen  Post-op Assessment: Report given to RN and Post -op Vital signs reviewed and stable  Post vital signs: Reviewed and stable  Last Vitals:  Vitals:   11/26/16 0811 11/26/16 1034  BP: (!) 163/76   Pulse: (!) 57 (P) 73  Resp: 16 (P) 14  Temp: 36.5 C (P) 36.4 C  SpO2: 100% (P) 99%    Last Pain:  Vitals:   11/26/16 0811  TempSrc: Oral      Patients Stated Pain Goal: 5 (89/38/10 1751)  Complications: No apparent anesthesia complications

## 2016-11-26 NOTE — Anesthesia Preprocedure Evaluation (Addendum)
Anesthesia Evaluation  Patient identified by MRN, date of birth, ID band Patient awake    Reviewed: Allergy & Precautions, NPO status , Patient's Chart, lab work & pertinent test results  Airway Mallampati: II  TM Distance: >3 FB Neck ROM: Full    Dental no notable dental hx. (+) Caps, Dental Advisory Given, Teeth Intact,    Pulmonary COPD, former smoker,    Pulmonary exam normal breath sounds clear to auscultation       Cardiovascular hypertension, Pt. on medications and Pt. on home beta blockers + CAD and + Peripheral Vascular Disease  Normal cardiovascular exam Rhythm:Regular Rate:Normal     Neuro/Psych negative neurological ROS  negative psych ROS   GI/Hepatic Neg liver ROS, GERD  Medicated and Controlled,  Endo/Other  negative endocrine ROS  Renal/GU negative Renal ROS   Bladder tumor negative genitourinary   Musculoskeletal negative musculoskeletal ROS (+)   Abdominal   Peds negative pediatric ROS (+)  Hematology negative hematology ROS (+)   Anesthesia Other Findings   Reproductive/Obstetrics negative OB ROS                           Anesthesia Physical Anesthesia Plan  ASA: III  Anesthesia Plan: General   Post-op Pain Management:    Induction: Intravenous  PONV Risk Score and Plan: 3 and Ondansetron, Dexamethasone and Midazolam  Airway Management Planned: LMA  Additional Equipment:   Intra-op Plan:   Post-operative Plan:   Informed Consent: I have reviewed the patients History and Physical, chart, labs and discussed the procedure including the risks, benefits and alternatives for the proposed anesthesia with the patient or authorized representative who has indicated his/her understanding and acceptance.   Dental advisory given  Plan Discussed with: CRNA  Anesthesia Plan Comments:         Anesthesia Quick Evaluation

## 2016-11-26 NOTE — Interval H&P Note (Signed)
History and Physical Interval Note:  11/26/2016 9:38 AM  Manuel Barrett  has presented today for surgery, with the diagnosis of LEFT TRIGONE BLADDER TUMOR  The various methods of treatment have been discussed with the patient and family. After consideration of risks, benefits and other options for treatment, the patient has consented to  Procedure(s): TRANSURETHRAL RESECTION OF BLADDER TUMOR (TURBT)/ POSSIBLE INSTILLATION OF EPIRUBICIN (N/A) CYSTOSCOPY WITH STENT PLACEMENT (Left) as a surgical intervention .  The patient's history has been reviewed, patient examined, no change in status, stable for surgery.  I have reviewed the patient's chart and labs.  Questions were answered to the patient's satisfaction.     [name] J

## 2016-11-27 ENCOUNTER — Encounter (HOSPITAL_BASED_OUTPATIENT_CLINIC_OR_DEPARTMENT_OTHER): Payer: Self-pay | Admitting: Urology

## 2016-12-02 DIAGNOSIS — R319 Hematuria, unspecified: Secondary | ICD-10-CM | POA: Diagnosis not present

## 2016-12-02 DIAGNOSIS — R809 Proteinuria, unspecified: Secondary | ICD-10-CM | POA: Diagnosis not present

## 2016-12-02 DIAGNOSIS — I10 Essential (primary) hypertension: Secondary | ICD-10-CM | POA: Diagnosis not present

## 2016-12-02 DIAGNOSIS — N182 Chronic kidney disease, stage 2 (mild): Secondary | ICD-10-CM | POA: Diagnosis not present

## 2016-12-07 ENCOUNTER — Other Ambulatory Visit: Payer: Self-pay | Admitting: Family Medicine

## 2016-12-07 DIAGNOSIS — M109 Gout, unspecified: Secondary | ICD-10-CM

## 2016-12-10 DIAGNOSIS — C670 Malignant neoplasm of trigone of bladder: Secondary | ICD-10-CM | POA: Diagnosis not present

## 2016-12-16 ENCOUNTER — Ambulatory Visit (INDEPENDENT_AMBULATORY_CARE_PROVIDER_SITE_OTHER): Payer: PPO | Admitting: Orthopaedic Surgery

## 2016-12-16 ENCOUNTER — Ambulatory Visit (INDEPENDENT_AMBULATORY_CARE_PROVIDER_SITE_OTHER): Payer: PPO

## 2016-12-16 ENCOUNTER — Other Ambulatory Visit (INDEPENDENT_AMBULATORY_CARE_PROVIDER_SITE_OTHER): Payer: Self-pay

## 2016-12-16 ENCOUNTER — Encounter (INDEPENDENT_AMBULATORY_CARE_PROVIDER_SITE_OTHER): Payer: Self-pay | Admitting: Orthopaedic Surgery

## 2016-12-16 VITALS — BP 145/77 | HR 49 | Resp 14 | Ht 70.0 in | Wt 175.0 lb

## 2016-12-16 DIAGNOSIS — M25512 Pain in left shoulder: Secondary | ICD-10-CM

## 2016-12-16 DIAGNOSIS — G8929 Other chronic pain: Secondary | ICD-10-CM

## 2016-12-16 NOTE — Progress Notes (Signed)
Office Visit Note   Patient: Manuel Barrett           Date of Birth: 12-Dec-1945           MRN: [id number] Visit Date: 12/16/2016              Requested by: Manuel Euler, MD [place], [place] [address] PCP: Manuel Euler, MD   Assessment & Plan: Visit Diagnoses:  1. Chronic left shoulder pain   Possible rotator cuff tear, possible early degenerative arthrosis glenohumeral joint. Positive impingement  Plan: CT arthrogram left shoulder. Patient is claustrophobic. Out of work until diagnosis established  Follow-Up Instructions: Return after CT arthrogram left shoulder.   Orders:  Orders Placed This Encounter  Procedures  . XR Shoulder Left   No orders of the defined types were placed in this encounter.     Procedures: No procedures performed   Clinical Data: No additional findings.   Subjective: Chief Complaint  Patient presents with  . Left Shoulder - Pain    Manuel Barrett is a 71 y o that presents with chronic Left shoulder pain x 3 months. The pain started in May, he went to doctor in [place], gave him an injection and sent him to PT for 6 weeks, no relief.   Insidious onset of left shoulder pain since May 2018. Denies any injury or trauma. He's been unable to work since May because of his discomfort. He does work Theatre manager a Clinical research associate. He's had trouble with any overhead activity and sleep related to the pain. He had been seen by his primary care physician, Dr. Wendi Barrett, and sent to physical therapy for over 2 months. He denies any significant relief of his pain. He denies any neck pain. He's not had any numbness or tingling. He does take an occasional Tylenol for the pain. He notes that he is really compromised in his activities HPI  Review of Systems  Constitutional: Negative for fatigue.  HENT: Negative for hearing loss.   Respiratory: Negative for apnea, chest tightness and shortness of breath.   Cardiovascular: Negative for chest pain,  palpitations and leg swelling.  Gastrointestinal: Negative for blood in stool, constipation and diarrhea.  Genitourinary: Negative for difficulty urinating.  Musculoskeletal: Negative for arthralgias, back pain, joint swelling, myalgias, neck pain and neck stiffness.  Neurological: Negative for weakness, numbness and headaches.  Hematological: Does not bruise/bleed easily.  Psychiatric/Behavioral: Negative for sleep disturbance. The patient is not nervous/anxious.      Objective: Vital Signs: BP (!) 145/77   Pulse (!) 49   Resp 14   Ht 5\' 10"  (1.778 m)   Wt 175 lb (79.4 kg)   BMI 25.11 kg/m   Physical Exam  Ortho Exam left shoulder with positive impingement. Able to raise left arm passively overhead but with considerable discomfort and circuitous motion. Positive empty can testing. Negative biceps pain or speed sign. Good strength with internal/external rotation. Positive tenderness over the acromioclavicular joint and along the anterior lateral subacromial region. No induration or ecchymosis. At no pain with range of motion of cervical spine in flexion extension or rotation were good grip and released distally. Neurovascular exam intact. Awake alert and oriented 3 without shortness of breath or chest pain  Specialty Comments:  No specialty comments available.  Imaging: Xr Shoulder Left  Result Date: 12/16/2016 Films of the left shoulder demonstrate degenerative changes at the acromioclavicular joint with inferiorly directed spurring. Normal space between the humeral head and the  chromium. No ectopic calcification. Possible slight superior elevation of the humeral head. Some subchondral sclerosis of the humeral head without osteophyte formation.    PMFS History: Patient Active Problem List   Diagnosis Date Noted  . CAD in native artery 03/28/2014  . Hyperlipidemia LDL goal <70 03/28/2014  . Hypertension    Past Medical History:  Diagnosis Date  . Bladder tumor   . CAD in  native artery followed by pcp at the [facility] in [place] 11-13-2016 pt denies S&S   per cardiac cath 03-06-2014  by dr Barrett--  moderate non-obstructive cad  . Emphysema/COPD ([place])   . GERD (gastroesophageal reflux disease)   . Gout    per pt currently stable 11-13-2016  . History of kidney stones   . History of MRSA infection    right hand abscess 09/ 2016  . Hypertension   . Left inguinal hernia   . PAD (peripheral artery disease) ([place]) first dx 1994---  was followed by vascular dr Amedeo Plenty   s/p  fem-pop bypass graft 1994 and 2001/  pt is followed by the [facility] in [facility] 11-13-2016 denies s&s  . Rotator cuff syndrome of left shoulder   . Wears hearing aid in both ears     Family History  Problem Relation Age of Onset  . Heart failure Father   . COPD Father     Past Surgical History:  Procedure Laterality Date  . ANTERIOR CERVICAL DECOMP/DISCECTOMY FUSION  02/1999  . AORTA - BILATERAL FEMORAL ARTERY BYPASS GRAFT  2001  dr Amedeo Plenty  . APPENDECTOMY  1959  . CYSTOSCOPY WITH STENT PLACEMENT Left 11/26/2016   Procedure: CYSTOSCOPY WITH left double J STENT PLACEMENT;  Surgeon: [name], MD;  Location: [facility];  Service: Urology;  Laterality: Left;  . FEMORAL-POPLITEAL BYPASS GRAFT  1994   dr Amedeo Plenty  . I&D EXTREMITY Right 01/09/2015   Procedure: IRRIGATION AND DEBRIDEMENT RIGHT THUMB/HAND ;  Surgeon: Roseanne Kaufman, MD;  Location: [facility];  Service: Orthopedics;  Laterality: Right;  . LEFT HEART CATHETERIZATION WITH CORONARY ANGIOGRAM N/A 03/06/2014   Procedure: LEFT HEART CATHETERIZATION WITH CORONARY ANGIOGRAM;  Surgeon: Gottfried Standish M Martinique, MD;  Location: [facility] CATH LAB;  Service: Cardiovascular;  Laterality: N/A;  midLAD 50-60%; D1 20-30%; mCFX 30%; pRCA 30%;  ef 55-65%  . LUMBAR SPINE SURGERY  1980's  . TRANSURETHRAL RESECTION OF BLADDER TUMOR N/A 11/26/2016   Procedure: TRANSURETHRAL RESECTION OF BLADDER TUMOR (TURBT)/ left ureteroscopy;  Surgeon: [name], MD;  Location:  [facility];  Service: Urology;  Laterality: N/A;   Social History   Occupational History  . Spinner operator    Social History Main Topics  . Smoking status: Former Smoker    Packs/day: 1.50    Years: 30.00    Types: Cigarettes    Quit date: 04/20/1992  . Smokeless tobacco: Never Used  . Alcohol use 0.0 oz/week     Comment: occasional  . Drug use: No  . Sexual activity: Not on file

## 2017-01-04 ENCOUNTER — Ambulatory Visit (HOSPITAL_COMMUNITY)
Admission: RE | Admit: 2017-01-04 | Discharge: 2017-01-04 | Disposition: A | Discharge status: Home / Self Care | Payer: PPO | Source: Ambulatory Visit | Attending: Orthopaedic Surgery | Admitting: Orthopaedic Surgery

## 2017-01-04 DIAGNOSIS — G8929 Other chronic pain: Secondary | ICD-10-CM | POA: Diagnosis not present

## 2017-01-04 DIAGNOSIS — M25512 Pain in left shoulder: Secondary | ICD-10-CM | POA: Insufficient documentation

## 2017-01-04 DIAGNOSIS — M19012 Primary osteoarthritis, left shoulder: Secondary | ICD-10-CM | POA: Insufficient documentation

## 2017-01-04 MED ORDER — SODIUM CHLORIDE 0.9 % IJ SOLN
INTRAMUSCULAR | Status: AC
  Administered 2017-01-04: 10 mL
  Filled 2017-01-04: qty 50

## 2017-01-04 MED ORDER — LIDOCAINE HCL (PF) 1 % IJ SOLN
INTRAMUSCULAR | Status: AC
  Administered 2017-01-04: 3 mL
  Filled 2017-01-04: qty 5

## 2017-01-04 MED ORDER — IOPAMIDOL (ISOVUE-300) INJECTION 61%
INTRAVENOUS | Status: AC
  Administered 2017-01-04: 8 mL
  Filled 2017-01-04: qty 30

## 2017-01-05 DIAGNOSIS — N200 Calculus of kidney: Secondary | ICD-10-CM | POA: Diagnosis not present

## 2017-01-05 DIAGNOSIS — C670 Malignant neoplasm of trigone of bladder: Secondary | ICD-10-CM | POA: Diagnosis not present

## 2017-01-06 ENCOUNTER — Ambulatory Visit (INDEPENDENT_AMBULATORY_CARE_PROVIDER_SITE_OTHER): Payer: PPO | Admitting: Orthopaedic Surgery

## 2017-01-06 ENCOUNTER — Encounter (INDEPENDENT_AMBULATORY_CARE_PROVIDER_SITE_OTHER): Payer: Self-pay | Admitting: Orthopaedic Surgery

## 2017-01-06 VITALS — BP 118/65 | HR 75 | Ht 69.0 in | Wt 160.0 lb

## 2017-01-06 DIAGNOSIS — M19012 Primary osteoarthritis, left shoulder: Secondary | ICD-10-CM

## 2017-01-06 MED ORDER — BUPIVACAINE HCL 0.5 % IJ SOLN
2.0000 mL | INTRAMUSCULAR | Status: AC | PRN
  Administered 2017-01-06: 2 mL via INTRA_ARTICULAR

## 2017-01-06 MED ORDER — METHYLPREDNISOLONE ACETATE 40 MG/ML IJ SUSP
80.0000 mg | INTRAMUSCULAR | Status: AC | PRN
  Administered 2017-01-06: 80 mg

## 2017-01-06 MED ORDER — LIDOCAINE HCL 1 % IJ SOLN
2.0000 mL | INTRAMUSCULAR | Status: AC | PRN
  Administered 2017-01-06: 2 mL

## 2017-01-06 NOTE — Progress Notes (Deleted)
Office Visit Note   Patient: Manuel Barrett           Date of Birth: 1946/03/10           MRN: [id number] Visit Date: 01/06/2017              Requested by: Timmothy Euler, MD [place], [place] [address] PCP: Timmothy Euler, MD   Assessment & Plan: Visit Diagnoses: No diagnosis found.  Plan: ***  Follow-Up Instructions: No Follow-up on file.   Orders:  No orders of the defined types were placed in this encounter.  No orders of the defined types were placed in this encounter.     Procedures: No procedures performed   Clinical Data: No additional findings.   Subjective: Chief Complaint  Patient presents with  . Left Shoulder - Pain    [name] is a 71 y o that is here to go over results of his L shoulder CT     HPI  Review of Systems  Constitutional: Negative for fatigue.  HENT: Negative for hearing loss.   Respiratory: Negative for apnea, chest tightness and shortness of breath.   Cardiovascular: Negative for chest pain, palpitations and leg swelling.  Gastrointestinal: Negative for blood in stool, constipation and diarrhea.  Genitourinary: Negative for difficulty urinating.  Musculoskeletal: Positive for neck stiffness. Negative for arthralgias, back pain, joint swelling, myalgias and neck pain.  Neurological: Negative for weakness, numbness and headaches.  Hematological: Does not bruise/bleed easily.  Psychiatric/Behavioral: Negative for sleep disturbance. The patient is not nervous/anxious.      Objective: Vital Signs: There were no vitals taken for this visit.  Physical Exam  Ortho Exam  Specialty Comments:  No specialty comments available.  Imaging: No results found.   PMFS History: Patient Active Problem List   Diagnosis Date Noted  . CAD in native artery 03/28/2014  . Hyperlipidemia LDL goal <70 03/28/2014  . Hypertension    Past Medical History:  Diagnosis Date  . Bladder tumor   . CAD in native artery followed  by pcp at the [facility] in [place] 11-13-2016 pt denies S&S   per cardiac cath 03-06-2014  by dr Martinique--  moderate non-obstructive cad  . Emphysema/COPD ([place])   . GERD (gastroesophageal reflux disease)   . Gout    per pt currently stable 11-13-2016  . History of kidney stones   . History of MRSA infection    right hand abscess 09/ 2016  . Hypertension   . Left inguinal hernia   . PAD (peripheral artery disease) ([place]) first dx 1994---  was followed by vascular dr Amedeo Plenty   s/p  fem-pop bypass graft 1994 and 2001/  pt is followed by the [facility] in [facility] 11-13-2016 denies s&s  . Rotator cuff syndrome of left shoulder   . Wears hearing aid in both ears     Family History  Problem Relation Age of Onset  . Heart failure Father   . COPD Father     Past Surgical History:  Procedure Laterality Date  . ANTERIOR CERVICAL DECOMP/DISCECTOMY FUSION  02/1999  . AORTA - BILATERAL FEMORAL ARTERY BYPASS GRAFT  2001  dr Amedeo Plenty  . APPENDECTOMY  1959  . CYSTOSCOPY WITH STENT PLACEMENT Left 11/26/2016   Procedure: CYSTOSCOPY WITH left double J STENT PLACEMENT;  Surgeon: [name], MD;  Location: [facility];  Service: Urology;  Laterality: Left;  . FEMORAL-POPLITEAL BYPASS GRAFT  1994   dr Amedeo Plenty  .  I&D EXTREMITY Right 01/09/2015   Procedure: IRRIGATION AND DEBRIDEMENT RIGHT THUMB/HAND ;  Surgeon: Roseanne Kaufman, MD;  Location: [facility];  Service: Orthopedics;  Laterality: Right;  . LEFT HEART CATHETERIZATION WITH CORONARY ANGIOGRAM N/A 03/06/2014   Procedure: LEFT HEART CATHETERIZATION WITH CORONARY ANGIOGRAM;  Surgeon: Peter M Martinique, MD;  Location[facility];  Service: Cardiovascular;  Laterality: N/A;  midLAD 50-60%; D1 20-30%; mCFX 30%; pRCA 30%;  ef 55-65%  . LUMBAR SPINE SURGERY  1980's  . TRANSURETHRAL RESECTION OF BLADDER TUMOR N/A 11/26/2016   Procedure: TRANSURETHRAL RESECTION OF BLADDER TUMOR (TURBT)/ left ureteroscopy;  Surgeon: [name], MD;  Location: [facility];   Service: Urology;  Laterality: N/A;   Social History   Occupational History  . Spinner operator    Social History Main Topics  . Smoking status: Former Smoker    Packs/day: 1.50    Years: 30.00    Types: Cigarettes    Quit date: 04/20/1992  . Smokeless tobacco: Never Used  . Alcohol use 0.0 oz/week     Comment: occasional  . Drug use: No  . Sexual activity: Not on file

## 2017-01-06 NOTE — Progress Notes (Signed)
Office Visit Note   Patient: Manuel Barrett           Date of Birth: Feb 13, 1946           MRN: [id number] Visit Date: 01/06/2017              Requested by: Timmothy Euler, MD [place], [place] [address] PCP: Timmothy Euler, MD   Assessment & Plan: Visit Diagnoses:  1. Primary osteoarthritis, left shoulder     Plan: long discussion regarding findings of CT arthrogram. Scan demonstrates an intact rotator cuff. Slight  acromioclavi Arthropathy. Full thickness cartilage loss of the posterior aspect of the gland inferior rm of the glenoidenohumeral joint with subcortical cyst formation in the posterior aspect of the glenoid associated with marginal osteophytes on the inferior rim of the humeral head and glenoid.will inject glenohumeral joint left shoulder with cortisone. Reevaluate in 3 weeks. Continue out of work Follow-Up Instructions: Return in about 3 weeks (around 01/27/2017).   Orders:  No orders of the defined types were placed in this encounter.  No orders of the defined types were placed in this encounter.     Procedures: Large Joint Inj Date/Time: 01/06/2017 2:30 PM Performed by: Garald Balding Authorized by: Garald Balding   Consent Given by:  Patient Timeout: prior to procedure the correct patient, procedure, and site was verified   Indications:  Pain Location:  Shoulder Site:  L glenohumeral Prep: patient was prepped and draped in usual sterile fashion   Needle Size:  25 G Needle Length:  1.5 inches Approach:  Posterior Ultrasound Guidance: No   Fluoroscopic Guidance: No   Arthrogram: No   Medications:  2 mL lidocaine 1 %; 2 mL bupivacaine 0.5 %; 80 mg methylPREDNISolone acetate 40 MG/ML Aspiration Attempted: No   Patient tolerance:  Patient tolerated the procedure well with no immediate complications     Clinical Data: No additional findings.   Subjective: Chief Complaint  Patient presents with  . Left Shoulder - Pain   Manuel Barrett is a 71 y o that is here to go over results of his L shoulder CT   CT scan results as above.  HPI  Review of Systems   Objective: Vital Signs: BP 118/65   Pulse 75   Ht 5\' 9"  (1.753 m)   Wt 160 lb (72.6 kg)   BMI 23.63 kg/m   Physical Exam  Ortho Examleft shoulder was some ecchymosis from recent CT arthrogram. Painful external rotation with minimal crepitation. No obvious  Neurovascular exam intact. Limited overhead motion with only about 100 of flexion and 70 of abduction. After intra-artular cortisone injection left shoulder I could flex 120 and abduct to 90 with considerable less pain  Specialty Comments:  No specialty comments available.  Imaging: No results found.   PMFS History: Patient Active Problem List   Diagnosis Date Noted  . Primary osteoarthritis, left shoulder 01/06/2017  . CAD in native artery 03/28/2014  . Hyperlipidemia LDL goal <70 03/28/2014  . Hypertension    Past Medical History:  Diagnosis Date  . Bladder tumor   . CAD in native artery followed by pcp at the [facility] in [place] 11-13-2016 pt denies S&S   per cardiac cath 03-06-2014  by dr Martinique--  moderate non-obstructive cad  . Emphysema/COPD ([place])   . GERD (gastroesophageal reflux disease)   . Gout    per pt currently stable 11-13-2016  . History of kidney stones   . History  of MRSA infection    right hand abscess 09/ 2016  . Hypertension   . Left inguinal hernia   . PAD (peripheral artery disease) ([place]) first dx 1994---  was followed by vascular dr Amedeo Plenty   s/p  fem-pop bypass graft 1994 and 2001/  pt is followed by the [facility] in [facility] 11-13-2016 denies s&s  . Rotator cuff syndrome of left shoulder   . Wears hearing aid in both ears     Family History  Problem Relation Age of Onset  . Heart failure Father   . COPD Father     Past Surgical History:  Procedure Laterality Date  . ANTERIOR CERVICAL DECOMP/DISCECTOMY FUSION  02/1999  . AORTA - BILATERAL FEMORAL ARTERY  BYPASS GRAFT  2001  dr Amedeo Plenty  . APPENDECTOMY  1959  . CYSTOSCOPY WITH STENT PLACEMENT Left 11/26/2016   Procedure: CYSTOSCOPY WITH left double J STENT PLACEMENT;  Surgeon: [name], MD;  [facility];  Service: Urology;  Laterality: Left;  . FEMORAL-POPLITEAL BYPASS GRAFT  1994   dr Amedeo Plenty  . I&D EXTREMITY Right 01/09/2015   Procedure: IRRIGATION AND DEBRIDEMENT RIGHT THUMB/HAND ;  Surgeon: Roseanne Kaufman, MD;  Location: [facility];  Service: Orthopedics;  Laterality: Right;  . LEFT HEART CATHETERIZATION WITH CORONARY ANGIOGRAM N/A 03/06/2014   Procedure: LEFT HEART CATHETERIZATION WITH CORONARY ANGIOGRAM;  Surgeon: Kenlyn Lose M Martinique, MD;  Location: [facility] CATH LAB;  Service: Cardiovascular;  Laterality: N/A;  midLAD 50-60%; D1 20-30%; mCFX 30%; pRCA 30%;  ef 55-65%  . LUMBAR SPINE SURGERY  1980's  . TRANSURETHRAL RESECTION OF BLADDER TUMOR N/A 11/26/2016   Procedure: TRANSURETHRAL RESECTION OF BLADDER TUMOR (TURBT)/ left ureteroscopy;  Surgeon: [name], MD;  Location: [facility];  Service: Urology;  Laterality: N/A;   Social History   Occupational History  . Spinner operator    Social History Main Topics  . Smoking status: Former Smoker    Packs/day: 1.50    Years: 30.00    Types: Cigarettes    Quit date: 04/20/1992  . Smokeless tobacco: Never Used  . Alcohol use 0.0 oz/week     Comment: occasional  . Drug use: No  . Sexual activity: Not on file     Garald Balding, MD   Note - This record has been created using [employer].  Chart creation errors have been sought, but may not always  have been located. Such creation errors do not reflect on  the standard of medical care.

## 2017-01-27 ENCOUNTER — Ambulatory Visit (INDEPENDENT_AMBULATORY_CARE_PROVIDER_SITE_OTHER): Payer: PPO | Admitting: Orthopaedic Surgery

## 2017-01-27 DIAGNOSIS — G8929 Other chronic pain: Secondary | ICD-10-CM | POA: Diagnosis not present

## 2017-01-27 DIAGNOSIS — M25512 Pain in left shoulder: Secondary | ICD-10-CM | POA: Diagnosis not present

## 2017-01-27 NOTE — Progress Notes (Signed)
Office Visit Note   Patient: Manuel Barrett           Date of Birth: 1946/04/10           MRN: [id number] Visit Date: 01/27/2017              Requested by: Timmothy Euler, MD [place], [place] [address] PCP: Timmothy Euler, MD   Assessment & Plan: Visit Diagnoses:  1. Chronic left shoulder pain   Prior CT scan reveals full-thickness cartilage loss of the posterior aspect of the glenohumeral joint was subcortical cyst formation in the posterior aspect of the glenoid with marginal osteophytes on the inferior margin of the humeral head on the inferior rim of the glenoid.. There was slight acromioclavicular joint arthropathy. Rotator cuff was intact. He does have some limited range of motion consistent with adhesive capsulitis well  Plan: Long discussion regarding treatment options and review of his present status. He's been  unable to work for several months because of his pain. He actually thought about retiring. He's had cortisone injections and a course of physical therapy with persistent pain. I believe the majority of his discomfort is related to his limited motion secondary to the arthritis. We have discussed different treatment options on an ongoing basis including simple shoulder manipulation, arthroscopic debridement was shoulder manipulation and even eventual need for shoulder replacement. At this point he would like to give it a little more time andreturn in a month .  Orders:  No orders of the defined types were placed in this encounter.  No orders of the defined types were placed in this encounter.     Procedures: No procedures performed   Clinical Data: No additional findings.   Subjective: No chief complaint on file. Continued pain left shoulder without any significant change despite time, injections, and physical therapy. CT scan as noted above. Presently not working but receiving disability up until next month. No related neck  discomfort.  HPI  Review of Systems   Objective: Vital Signs: There were no vitals taken for this visit.  Physical Exam  Ortho Exam left upper extremity nondominant. No pain with range of motion of left shoulder with his arm at his side. However in the impingement position he is uncomfortable. I can abduct about 90 and flex about 120 at which point he had a hard endpoint. There was no pain at the acromioclavicular joint or the subacromial region. Good strength. Skin intact. Good grip and good release. No pain with range of motion of cervical spine.  Specialty Comments:  No specialty comments available.  Imaging: No results found.   PMFS History: Patient Active Problem List   Diagnosis Date Noted  . Primary osteoarthritis, left shoulder 01/06/2017  . CAD in native artery 03/28/2014  . Hyperlipidemia LDL goal <70 03/28/2014  . Hypertension    Past Medical History:  Diagnosis Date  . Bladder tumor   . CAD in native artery followed by pcp at the [facility] in [place] 11-13-2016 pt denies S&S   per cardiac cath 03-06-2014  by dr Martinique--  moderate non-obstructive cad  . Emphysema/COPD ([place])   . GERD (gastroesophageal reflux disease)   . Gout    per pt currently stable 11-13-2016  . History of kidney stones   . History of MRSA infection    right hand abscess 09/ 2016  . Hypertension   . Left inguinal hernia   . PAD (peripheral artery disease) ([place]) first dx 1994---  was followed by vascular dr Amedeo Plenty   s/p  fem-pop bypass graft 1994 and 2001/  pt is followed by the [facility] in [facility] 11-13-2016 denies s&s  . Rotator cuff syndrome of left shoulder   . Wears hearing aid in both ears     Family History  Problem Relation Age of Onset  . Heart failure Father   . COPD Father     Past Surgical History:  Procedure Laterality Date  . ANTERIOR CERVICAL DECOMP/DISCECTOMY FUSION  02/1999  . AORTA - BILATERAL FEMORAL ARTERY BYPASS GRAFT  2001  dr Amedeo Plenty  . APPENDECTOMY  1959  .  CYSTOSCOPY WITH STENT PLACEMENT Left 11/26/2016   Procedure: CYSTOSCOPY WITH left double J STENT PLACEMENT;  Surgeon: [name], MD;  Location: [facility];  Service: Urology;  Laterality: Left;  . FEMORAL-POPLITEAL BYPASS GRAFT  1994   dr Amedeo Plenty  . I&D EXTREMITY Right 01/09/2015   Procedure: IRRIGATION AND DEBRIDEMENT RIGHT THUMB/HAND ;  Surgeon: Roseanne Kaufman, MD;  Location: [facility];  Service: Orthopedics;  Laterality: Right;  . LEFT HEART CATHETERIZATION WITH CORONARY ANGIOGRAM N/A 03/06/2014   Procedure: LEFT HEART CATHETERIZATION WITH CORONARY ANGIOGRAM;  Surgeon: Aidyn Sportsman M Martinique, MD;  Location: [facility] CATH LAB;  Service: Cardiovascular;  Laterality: N/A;  midLAD 50-60%; D1 20-30%; mCFX 30%; pRCA 30%;  ef 55-65%  . LUMBAR SPINE SURGERY  1980's  . TRANSURETHRAL RESECTION OF BLADDER TUMOR N/A 11/26/2016   Procedure: TRANSURETHRAL RESECTION OF BLADDER TUMOR (TURBT)/ left ureteroscopy;  Surgeon: [name], MD;  Location: [facility];  Service: Urology;  Laterality: N/A;   Social History   Occupational History  . Spinner operator    Social History Main Topics  . Smoking status: Former Smoker    Packs/day: 1.50    Years: 30.00    Types: Cigarettes    Quit date: 04/20/1992  . Smokeless tobacco: Never Used  . Alcohol use 0.0 oz/week     Comment: occasional  . Drug use: No  . Sexual activity: Not on file     Garald Balding, MD   Note - This record has been created using [employer].  Chart creation errors have been sought, but may not always  have been located. Such creation errors do not reflect on  the standard of medical care.

## 2017-02-11 ENCOUNTER — Other Ambulatory Visit: Payer: Self-pay | Admitting: Family Medicine

## 2017-02-11 DIAGNOSIS — M109 Gout, unspecified: Secondary | ICD-10-CM

## 2017-03-08 ENCOUNTER — Telehealth (INDEPENDENT_AMBULATORY_CARE_PROVIDER_SITE_OTHER): Payer: Self-pay

## 2017-03-08 NOTE — Telephone Encounter (Signed)
Patient would like to proceed with having surgery.  CB# is [phone].  Please advise.  Thank You.

## 2017-03-08 NOTE — Telephone Encounter (Signed)
Please see message. °

## 2017-03-08 NOTE — Telephone Encounter (Signed)
Completed blue surgery sheet

## 2017-03-15 ENCOUNTER — Other Ambulatory Visit: Payer: Self-pay | Admitting: Family Medicine

## 2017-03-15 DIAGNOSIS — M109 Gout, unspecified: Secondary | ICD-10-CM

## 2017-03-25 DIAGNOSIS — M7542 Impingement syndrome of left shoulder: Secondary | ICD-10-CM | POA: Diagnosis not present

## 2017-03-25 DIAGNOSIS — M659 Synovitis and tenosynovitis, unspecified: Secondary | ICD-10-CM | POA: Diagnosis not present

## 2017-03-25 DIAGNOSIS — M19012 Primary osteoarthritis, left shoulder: Secondary | ICD-10-CM | POA: Diagnosis not present

## 2017-03-25 DIAGNOSIS — M948X1 Other specified disorders of cartilage, shoulder: Secondary | ICD-10-CM | POA: Diagnosis not present

## 2017-03-25 DIAGNOSIS — M7502 Adhesive capsulitis of left shoulder: Secondary | ICD-10-CM | POA: Diagnosis not present

## 2017-03-25 DIAGNOSIS — G8918 Other acute postprocedural pain: Secondary | ICD-10-CM | POA: Diagnosis not present

## 2017-03-31 ENCOUNTER — Ambulatory Visit (INDEPENDENT_AMBULATORY_CARE_PROVIDER_SITE_OTHER): Payer: PPO | Admitting: Orthopaedic Surgery

## 2017-03-31 ENCOUNTER — Encounter (INDEPENDENT_AMBULATORY_CARE_PROVIDER_SITE_OTHER): Payer: Self-pay | Admitting: Orthopaedic Surgery

## 2017-03-31 VITALS — BP 165/82 | HR 63 | Resp 12 | Ht 71.0 in | Wt 165.0 lb

## 2017-03-31 DIAGNOSIS — M25512 Pain in left shoulder: Secondary | ICD-10-CM

## 2017-03-31 DIAGNOSIS — G8929 Other chronic pain: Secondary | ICD-10-CM

## 2017-03-31 NOTE — Progress Notes (Signed)
Office Visit Note   Patient: Manuel Barrett           Date of Birth: 1946/01/31           MRN: [id number] Visit Date: 03/31/2017              Requested by: Timmothy Euler, MD [place], [place] [address] PCP: Timmothy Euler, MD   Assessment & Plan: Visit Diagnoses:  1. Chronic left shoulder pain     Plan: 6 days status post left shoulder arthroscopic debridement including an arthroscopic subacromial decompression, distal clavicle resection and partial cuff tear debridement. Doing quite well. Only taking Tylenol for pain. Also had adhesive capsulitis so he needs to work range of motion. He preferred do this on his own. I'll see him back in 3 weeks  Follow-Up Instructions: Return in about 3 weeks (around 04/21/2017).   Orders:  No orders of the defined types were placed in this encounter.  No orders of the defined types were placed in this encounter.     Procedures: No procedures performed   Clinical Data: No additional findings.   Subjective: Chief Complaint  Patient presents with  . Left Shoulder - Routine Post Op    Manuel Barrett is a 71 y o S/P 1 week Left shoulder arthroscopy. He only takes Tylenol TID.  Doing quite well without any complications. Tylenol for pain. No numbness or tingling  HPI  Review of Systems   Objective: Vital Signs: BP (!) 165/82   Pulse 63   Resp 12   Ht 5\' 11"  (1.803 m)   Wt 165 lb (74.8 kg)   BMI 23.01 kg/m   Physical Exam  Ortho Exam arthroscopic portals healing without complication. New Steri-Strips applied. Neurovascular exam intact. Resolving ecchymosis from the surgery. He was able to place his arm almost fully over his head passively  Specialty Comments:  No specialty comments available.  Imaging: No results found.   PMFS History: Patient Active Problem List   Diagnosis Date Noted  . Primary osteoarthritis, left shoulder 01/06/2017  . CAD in native artery 03/28/2014  . Hyperlipidemia LDL goal <70  03/28/2014  . Hypertension    Past Medical History:  Diagnosis Date  . Bladder tumor   . CAD in native artery followed by pcp at the [facility] in [place] 11-13-2016 pt denies S&S   per cardiac cath 03-06-2014  by dr Martinique--  moderate non-obstructive cad  . Emphysema/COPD ([place])   . GERD (gastroesophageal reflux disease)   . Gout    per pt currently stable 11-13-2016  . History of kidney stones   . History of MRSA infection    right hand abscess 09/ 2016  . Hypertension   . Left inguinal hernia   . PAD (peripheral artery disease) ([place]) first dx 1994---  was followed by vascular dr Amedeo Plenty   s/p  fem-pop bypass graft 1994 and 2001/  pt is followed by the [facility] in [facility] 11-13-2016 denies s&s  . Rotator cuff syndrome of left shoulder   . Wears hearing aid in both ears     Family History  Problem Relation Age of Onset  . Heart failure Father   . COPD Father     Past Surgical History:  Procedure Laterality Date  . ANTERIOR CERVICAL DECOMP/DISCECTOMY FUSION  02/1999  . AORTA - BILATERAL FEMORAL ARTERY BYPASS GRAFT  2001  dr Amedeo Plenty  . APPENDECTOMY  1959  . CYSTOSCOPY WITH STENT PLACEMENT Left 11/26/2016  Procedure: CYSTOSCOPY WITH left double J STENT PLACEMENT;  Surgeon: [name], MD;  Location: [facility];  Service: Urology;  Laterality: Left;  . FEMORAL-POPLITEAL BYPASS GRAFT  1994   dr Amedeo Plenty  . I&D EXTREMITY Right 01/09/2015   Procedure: IRRIGATION AND DEBRIDEMENT RIGHT THUMB/HAND ;  Surgeon: Roseanne Kaufman, MD;  Location: [facility];  Service: Orthopedics;  Laterality: Right;  . LEFT HEART CATHETERIZATION WITH CORONARY ANGIOGRAM N/A 03/06/2014   Procedure: LEFT HEART CATHETERIZATION WITH CORONARY ANGIOGRAM;  Surgeon: Luqman Perrelli M Martinique, MD;  Location: [facility] CATH LAB;  Service: Cardiovascular;  Laterality: N/A;  midLAD 50-60%; D1 20-30%; mCFX 30%; pRCA 30%;  ef 55-65%  . LUMBAR SPINE SURGERY  1980's  . TRANSURETHRAL RESECTION OF BLADDER TUMOR N/A 11/26/2016   Procedure:  TRANSURETHRAL RESECTION OF BLADDER TUMOR (TURBT)/ left ureteroscopy;  Surgeon: [name], MD;  Location: [facility];  Service: Urology;  Laterality: N/A;   Social History   Occupational History  . Occupation: Primary school teacher  Tobacco Use  . Smoking status: Former Smoker    Packs/day: 1.50    Years: 30.00    Pack years: 45.00    Types: Cigarettes    Last attempt to quit: 04/20/1992    Years since quitting: 24.9  . Smokeless tobacco: Never Used  Substance and Sexual Activity  . Alcohol use: Yes    Alcohol/week: 0.0 oz    Comment: occasional  . Drug use: No  . Sexual activity: Not on file     Garald Balding, MD   Note - This record has been created using [employer].  Chart creation errors have been sought, but may not always  have been located. Such creation errors do not reflect on  the standard of medical care.

## 2017-03-31 NOTE — Progress Notes (Deleted)
Office Visit Note   Patient: Manuel Barrett           Date of Birth: [birth year]           MRN: [id number] Visit Date: 03/31/2017              Requested by: Timmothy Euler, MD [place], [place] [address] PCP: Timmothy Euler, MD   Assessment & Plan: Visit Diagnoses: No diagnosis found.  Plan: ***  Follow-Up Instructions: No Follow-up on file.   Orders:  No orders of the defined types were placed in this encounter.  No orders of the defined types were placed in this encounter.     Procedures: No procedures performed   Clinical Data: No additional findings.   Subjective: Chief Complaint  Patient presents with  . Left Shoulder - Routine Post Op    Manuel Barrett is a 72 y o S/P 1 week Left shoulder arthroscopy. He only takes Tylenol TID.    HPI  Review of Systems  Constitutional: Negative for fatigue.  HENT: Negative for hearing loss.   Respiratory: Negative for apnea, chest tightness and shortness of breath.   Cardiovascular: Negative for chest pain, palpitations and leg swelling.  Gastrointestinal: Negative for blood in stool, constipation and diarrhea.  Genitourinary: Negative for difficulty urinating.  Musculoskeletal: Positive for neck pain and neck stiffness. Negative for arthralgias, back pain, joint swelling and myalgias.  Neurological: Negative for weakness, numbness and headaches.  Hematological: Does not bruise/bleed easily.  Psychiatric/Behavioral: Negative for sleep disturbance. The patient is not nervous/anxious.      Objective: Vital Signs: BP (!) 165/82   Pulse 63   Resp 12   Ht 5\' 11"  (1.803 m)   Wt 165 lb (74.8 kg)   BMI 23.01 kg/m   Physical Exam  Ortho Exam  Specialty Comments:  No specialty comments available.  Imaging: No results found.   PMFS History: Patient Active Problem List   Diagnosis Date Noted  . Primary osteoarthritis, left shoulder 01/06/2017  . CAD in native artery 03/28/2014  . Hyperlipidemia  LDL goal <70 03/28/2014  . Hypertension    Past Medical History:  Diagnosis Date  . Bladder tumor   . CAD in native artery followed by pcp at the [facility] in [place] 11-13-2016 pt denies S&S   per cardiac cath 03-06-2014  by dr Martinique--  moderate non-obstructive cad  . Emphysema/COPD ([place])   . GERD (gastroesophageal reflux disease)   . Gout    per pt currently stable 11-13-2016  . History of kidney stones   . History of MRSA infection    right hand abscess 09/ 2016  . Hypertension   . Left inguinal hernia   . PAD (peripheral artery disease) ([place]) first dx 1994---  was followed by vascular dr Amedeo Plenty   s/p  fem-pop bypass graft 1994 and 2001/  pt is followed by the [facility] in [facility] 11-13-2016 denies s&s  . Rotator cuff syndrome of left shoulder   . Wears hearing aid in both ears     Family History  Problem Relation Age of Onset  . Heart failure Father   . COPD Father     Past Surgical History:  Procedure Laterality Date  . ANTERIOR CERVICAL DECOMP/DISCECTOMY FUSION  02/1999  . AORTA - BILATERAL FEMORAL ARTERY BYPASS GRAFT  2001  dr Amedeo Plenty  . APPENDECTOMY  1959  . CYSTOSCOPY WITH STENT PLACEMENT Left 11/26/2016   Procedure: CYSTOSCOPY WITH left double J STENT  PLACEMENT;  Surgeon: [name], MD;  Location: [facility];  Service: Urology;  Laterality: Left;  . FEMORAL-POPLITEAL BYPASS GRAFT  1994   dr Amedeo Plenty  . I&D EXTREMITY Right 01/09/2015   Procedure: IRRIGATION AND DEBRIDEMENT RIGHT THUMB/HAND ;  Surgeon: Roseanne Kaufman, MD;  Location: [facility];  Service: Orthopedics;  Laterality: Right;  . LEFT HEART CATHETERIZATION WITH CORONARY ANGIOGRAM N/A 03/06/2014   Procedure: LEFT HEART CATHETERIZATION WITH CORONARY ANGIOGRAM;  Surgeon: Peter M Martinique, MD;  Location: [facility] CATH LAB;  Service: Cardiovascular;  Laterality: N/A;  midLAD 50-60%; D1 20-30%; mCFX 30%; pRCA 30%;  ef 55-65%  . LUMBAR SPINE SURGERY  1980's  . TRANSURETHRAL RESECTION OF BLADDER TUMOR N/A 11/26/2016    Procedure: TRANSURETHRAL RESECTION OF BLADDER TUMOR (TURBT)/ left ureteroscopy;  Surgeon: [name], MD;  Location: [facility];  Service: Urology;  Laterality: N/A;   Social History   Occupational History  . Occupation: Primary school teacher  Tobacco Use  . Smoking status: Former Smoker    Packs/day: 1.50    Years: 30.00    Pack years: 45.00    Types: Cigarettes    Last attempt to quit: 04/20/1992    Years since quitting: 24.9  . Smokeless tobacco: Never Used  Substance and Sexual Activity  . Alcohol use: Yes    Alcohol/week: 0.0 oz    Comment: occasional  . Drug use: No  . Sexual activity: Not on file

## 2017-04-12 ENCOUNTER — Other Ambulatory Visit: Payer: Self-pay | Admitting: Family Medicine

## 2017-04-12 DIAGNOSIS — M109 Gout, unspecified: Secondary | ICD-10-CM

## 2017-04-14 NOTE — Telephone Encounter (Signed)
Last seen 10/06/16  Dr Wendi Snipes

## 2017-04-19 ENCOUNTER — Other Ambulatory Visit: Payer: Self-pay | Admitting: Family Medicine

## 2017-04-19 DIAGNOSIS — M109 Gout, unspecified: Secondary | ICD-10-CM

## 2017-04-21 ENCOUNTER — Other Ambulatory Visit (INDEPENDENT_AMBULATORY_CARE_PROVIDER_SITE_OTHER): Payer: Self-pay

## 2017-04-21 ENCOUNTER — Ambulatory Visit (INDEPENDENT_AMBULATORY_CARE_PROVIDER_SITE_OTHER): Payer: PPO | Admitting: Orthopaedic Surgery

## 2017-04-21 ENCOUNTER — Encounter (INDEPENDENT_AMBULATORY_CARE_PROVIDER_SITE_OTHER): Payer: Self-pay | Admitting: Orthopaedic Surgery

## 2017-04-21 DIAGNOSIS — M25512 Pain in left shoulder: Secondary | ICD-10-CM

## 2017-04-21 DIAGNOSIS — G8929 Other chronic pain: Secondary | ICD-10-CM

## 2017-04-21 MED ORDER — DICLOFENAC SODIUM 1 % TD GEL: 2.0000 g | Freq: Four times a day (QID) | TRANSDERMAL | 2 refills | Status: DC

## 2017-04-21 NOTE — Progress Notes (Signed)
Office Visit Note   Patient: Manuel Barrett           Date of Birth: 1945/07/10           MRN: [id number] Visit Date: 04/21/2017              Requested by: Manuel Euler, MD [place],  [address] PCP: Manuel Euler, MD   Assessment & Plan: Visit Diagnoses:  1. Chronic left shoulder pain     Plan: Manuel Barrett is 1 month status post manipulation of the shoulder 40s of capsulitis associated with an arthroscopic SCD and DCR. I also debrided a partial rotator cuff tear. He is doing well. He is retired. Still a little bit tight and with that flexion. He wants to continue working on his own without therapy and I've instructed him on further exercises, and see him back in a month  Follow-Up Instructions: Return in about 1 month (around 05/22/2017).   Orders:  No orders of the defined types were placed in this encounter.  No orders of the defined types were placed in this encounter.     Procedures: No procedures performed   Clinical Data: No additional findings.   Subjective: No chief complaint on file. Still having a little soreness and achiness but better than he was preoperatively. No numbness or tingling  HPI  Review of Systems   Objective: Vital Signs: There were no vitals taken for this visit.  Physical Exam  Ortho Exam lacks the last 20 of full overhead motion which is significantly better than it was preoperatively. No impingement and negative empty can testing. Arthroscopic portals have healed. Good grip and good release. Still has some mild adhesive capsulitis in both flexion and an external rotation  Specialty Comments:  No specialty comments available.  Imaging: No results found.   PMFS History: Patient Active Problem List   Diagnosis Date Noted  . Primary osteoarthritis, left shoulder 01/06/2017  . CAD in native artery 03/28/2014  . Hyperlipidemia LDL goal <70 03/28/2014  . Hypertension    Past Medical History:  Diagnosis  Date  . Bladder tumor   . CAD in native artery followed by pcp at the [facility] in [place] 11-13-2016 pt denies S&S   per cardiac cath 03-06-2014  by dr Martinique--  moderate non-obstructive cad  . Emphysema/COPD ([place])   . GERD (gastroesophageal reflux disease)   . Gout    per pt currently stable 11-13-2016  . History of kidney stones   . History of MRSA infection    right hand abscess 09/ 2016  . Hypertension   . Left inguinal hernia   . PAD (peripheral artery disease) ([place]) first dx 1994---  was followed by vascular dr Amedeo Plenty   s/p  fem-pop bypass graft 1994 and 2001/  pt is followed by the [facility] in [facility] 11-13-2016 denies s&s  . Rotator cuff syndrome of left shoulder   . Wears hearing aid in both ears     Family History  Problem Relation Age of Onset  . Heart failure Father   . COPD Father     Past Surgical History:  Procedure Laterality Date  . ANTERIOR CERVICAL DECOMP/DISCECTOMY FUSION  02/1999  . AORTA - BILATERAL FEMORAL ARTERY BYPASS GRAFT  2001  dr Amedeo Plenty  . APPENDECTOMY  1959  . CYSTOSCOPY WITH STENT PLACEMENT Left 11/26/2016   Procedure: CYSTOSCOPY WITH left double J STENT PLACEMENT;  Surgeon: [name], MD;  Location: [facility];  Service: Urology;  Laterality: Left;  . FEMORAL-POPLITEAL BYPASS GRAFT  1994   dr Amedeo Plenty  . I&D EXTREMITY Right 01/09/2015   Procedure: IRRIGATION AND DEBRIDEMENT RIGHT THUMB/HAND ;  Surgeon: Roseanne Kaufman, MD;  Location: [facility];  Service: Orthopedics;  Laterality: Right;  . LEFT HEART CATHETERIZATION WITH CORONARY ANGIOGRAM N/A 03/06/2014   Procedure: LEFT HEART CATHETERIZATION WITH CORONARY ANGIOGRAM;  Surgeon: Shonta Bourque M Martinique, MD;  Location: [facility] CATH LAB;  Service: Cardiovascular;  Laterality: N/A;  midLAD 50-60%; D1 20-30%; mCFX 30%; pRCA 30%;  ef 55-65%  . LUMBAR SPINE SURGERY  1980's  . TRANSURETHRAL RESECTION OF BLADDER TUMOR N/A 11/26/2016   Procedure: TRANSURETHRAL RESECTION OF BLADDER TUMOR (TURBT)/ left ureteroscopy;   Surgeon: [name], MD;  Location: [facility];  Service: Urology;  Laterality: N/A;   Social History   Occupational History  . Occupation: Primary school teacher  Tobacco Use  . Smoking status: Former Smoker    Packs/day: 1.50    Years: 30.00    Pack years: 45.00    Types: Cigarettes    Last attempt to quit: 04/20/1992    Years since quitting: 25.0  . Smokeless tobacco: Never Used  Substance and Sexual Activity  . Alcohol use: Yes    Alcohol/week: 0.0 oz    Comment: occasional  . Drug use: No  . Sexual activity: Not on file     Garald Balding, MD   Note - This record has been created using [employer].  Chart creation errors have been sought, but may not always  have been located. Such creation errors do not reflect on  the standard of medical care.

## 2017-05-03 DIAGNOSIS — Z8551 Personal history of malignant neoplasm of bladder: Secondary | ICD-10-CM | POA: Diagnosis not present

## 2017-05-17 ENCOUNTER — Telehealth: Payer: Self-pay | Admitting: Family Medicine

## 2017-05-17 DIAGNOSIS — M109 Gout, unspecified: Secondary | ICD-10-CM

## 2017-05-17 MED ORDER — COLCHICINE 0.6 MG PO TABS: 0.6000 mg | ORAL_TABLET | Freq: Every day | ORAL | 3 refills | Status: DC

## 2017-05-17 NOTE — Telephone Encounter (Signed)
I have written Rx for Colchicine, I have written it for daily dosing as previously prescribed, however if he needs this daily it may be that we actually need to adjust his allopurinol dose.  I would recommend follow-up for gout

## 2017-05-17 NOTE — Telephone Encounter (Signed)
Please review and advise.

## 2017-05-17 NOTE — Telephone Encounter (Signed)
Wife aware. Rx placed up front

## 2017-05-19 ENCOUNTER — Inpatient Hospital Stay (INDEPENDENT_AMBULATORY_CARE_PROVIDER_SITE_OTHER): Payer: PPO | Admitting: Orthopaedic Surgery

## 2017-06-08 ENCOUNTER — Telehealth: Payer: Self-pay | Admitting: Family Medicine

## 2017-06-30 ENCOUNTER — Ambulatory Visit (INDEPENDENT_AMBULATORY_CARE_PROVIDER_SITE_OTHER): Payer: PPO

## 2017-06-30 ENCOUNTER — Telehealth: Payer: Self-pay

## 2017-06-30 VITALS — BP 140/74 | HR 60 | Ht 71.0 in | Wt 187.0 lb

## 2017-06-30 DIAGNOSIS — Z0000 Encounter for general adult medical examination without abnormal findings: Secondary | ICD-10-CM

## 2017-06-30 DIAGNOSIS — I2510 Atherosclerotic heart disease of native coronary artery without angina pectoris: Secondary | ICD-10-CM

## 2017-06-30 NOTE — Telephone Encounter (Signed)
Patient had an AWV today and during the visit he discussed that he was recently seen at the [facility]. He is having bouts of SOB and CP at times. Patient states that 4-5 years ago he was told that he had a 65% blockage and he has not had any follow up with the cardiologist since then. He discussed with the [facility] and they sent him to a new cardiologist recently with the [facility] but she couldn't locate paperwork that he had heart cath. He was told that he doesn't have any problem but paperwork was never seen or reviewed. Would like a referral to follow back up with Dr Martinique in cardiology. He was the one that did the heart cath before and would like him to check him out again. Please advise.

## 2017-06-30 NOTE — Patient Instructions (Signed)
  Manuel Barrett , Thank you for taking time to come for your Medicare Wellness Visit. I appreciate your ongoing commitment to your health goals. Please review the following plan we discussed and let me know if I can assist you in the future.   These are the goals we discussed: Goals    . Exercise 150 min/wk Moderate Activity       This is a list of the screening recommended for you and due dates:  Health Maintenance  Topic Date Due  .  Hepatitis C: One time screening is recommended by Center for Disease Control  (CDC) for  adults born from [birth year] through 1963.   June 01, 1945  . Tetanus Vaccine  03/15/1967  . Colon Cancer Screening  03/14/1998  . Pneumonia vaccines (1 of 2 - PCV13) 03/14/2013  . Flu Shot  11/18/2016

## 2017-07-01 NOTE — Telephone Encounter (Signed)
Patient aware that referral to cardiology has been placed.

## 2017-07-01 NOTE — Telephone Encounter (Signed)
referral for cardiology sent.    Laroy Apple, MD [facility] 07/01/2017, 7:45 AM

## 2017-07-02 NOTE — Progress Notes (Signed)
Subjective:   Manuel Barrett is a 72 y.o. male who presents for an Initial Medicare Annual Wellness Visit   Patient presents to clinic today accompanied by his wife. He is very pleasant and joking. He is a [place] Veteran and sees the [place] for check ups on a regular basis but also is seen here at the clinic. He is a former smoker. He has worked his whole life and remains active by working about the house. 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 He spent the last 9 years that he worked at [employer] and then retired. Patient states that he saw a cardiologist several years back and they told him he had a blockage of 65%. [facility] due to increased SOB and intermittent chest pain. Due to the [place] not having records the patient felt like it wasn't followed up on like it should have been. He is requesting to be referred back to the cardiologist that originally diagnosed him with the blockage. Referral made and message sent to Dr Wendi Snipes for approval. Patient states that he received both pneumonia shots, Prevnar and pneumovax 23, at the [place] and also both shingles vaccines. His tetanus was updated in 2017 when he had to visit urgent care. He lives with his wife. They have 3 grown children. He attends church regularly and enjoys working around the house when he can.   Cardiac Risk Factors include: advanced age (>74men, >62 women);dyslipidemia;hypertension;male gender;smoking/ tobacco exposure    Objective:    Today's Vitals   07/02/17 0943  BP: 140/74  Pulse: 60  Weight: 187 lb (84.8 kg)  Height: 5\' 11"  (1.803 m)   Body mass index is 26.08 kg/m.  Advanced Directives 07/02/2017 11/26/2016 10/11/2016 10/04/2016 09/08/2016 05/11/2016 07/11/2015  Does Patient Have a Medical Advance Directive? No No No No No No No  Would patient like information on  creating a medical advance directive? No - Patient declined No - Patient declined - - - - No - patient declined information   Patient states that he and his wife have spoken to their oldest son and he is aware of what plans they would like.   Current Medications (verified) Outpatient Encounter Medications as of 06/30/2017  Medication Sig  . acetaminophen (TYLENOL) 500 MG tablet Take 1,500 mg by mouth every 6 (six) hours as needed for moderate pain.  Marland Kitchen allopurinol (ZYLOPRIM) 100 MG tablet Take 100 mg by mouth every morning.   Marland Kitchen amLODipine (NORVASC) 10 MG tablet Take 10 mg by mouth every morning.   Marland Kitchen aspirin EC 325 MG tablet Take 325 mg by mouth daily.  Marland Kitchen atorvastatin (LIPITOR) 40 MG tablet Take 1 tablet (40 mg total) by mouth daily at 6 PM. (Patient taking differently: Take 20 mg by mouth every evening. )  . colchicine 0.6 MG tablet Take 1 tablet (0.6 mg total) by mouth daily.  . metoprolol (LOPRESSOR) 50 MG tablet Take 1 tablet (50 mg total) by mouth 2 (two) times daily.  . nitroGLYCERIN (NITROSTAT) 0.4 MG SL tablet Place 1 tablet (0.4 mg total) under the tongue every 5 (five) minutes as needed for chest pain.  Marland Kitchen omeprazole (PRILOSEC) 20 MG capsule Take 1 capsule (20 mg total) by mouth daily as needed (acid reflux).  . [DISCONTINUED] alfuzosin (UROXATRAL) 10 MG 24 hr tablet Take 1  tablet (10 mg total) by mouth daily with breakfast.  . [DISCONTINUED] diclofenac sodium (VOLTAREN) 1 % GEL Apply 2 g topically 4 (four) times daily.  . [DISCONTINUED] HYDROcodone-acetaminophen (NORCO) 5-325 MG tablet Take 1 tablet by mouth every 6 (six) hours as needed for moderate pain.  . [DISCONTINUED] oxybutynin (DITROPAN) 5 MG tablet Take 1 tablet (5 mg total) by mouth 3 (three) times daily as needed for bladder spasms.   Facility-Administered Encounter Medications as of 06/30/2017  Medication  . epirubicin (ELLENCE) 50 mg in sodium chloride 0.9 % bladder instillation    Allergies (verified) Indomethacin    History: Past Medical History:  Diagnosis Date  . Bladder tumor   . CAD in native artery followed by pcp at the [facility] [place] in [place] 11-13-2016 pt denies S&S   per cardiac cath 03-06-2014  by dr Martinique--  moderate non-obstructive cad  . Emphysema/COPD ([place])   . GERD (gastroesophageal reflux disease)   . Gout    per pt currently stable 11-13-2016  . History of kidney stones   . History of MRSA infection    right hand abscess 09/ 2016  . Hypertension   . Left inguinal hernia   . PAD (peripheral artery disease) ([place]) first dx 1994---  was followed by vascular dr Amedeo Plenty   s/p  fem-pop bypass graft 1994 and 2001/  pt is followed by the [facility] in [facility] 11-13-2016 denies s&s  . Rotator cuff syndrome of left shoulder   . Wears hearing aid in both ears    Past Surgical History:  Procedure Laterality Date  . ANTERIOR CERVICAL DECOMP/DISCECTOMY FUSION  02/1999  . AORTA - BILATERAL FEMORAL ARTERY BYPASS GRAFT  2001  dr Amedeo Plenty  . APPENDECTOMY  1959  . CYSTOSCOPY WITH STENT PLACEMENT Left 11/26/2016   Procedure: CYSTOSCOPY WITH left double J STENT PLACEMENT;  Surgeon: [name], MD;  Location: [facility];  Service: Urology;  Laterality: Left;  . FEMORAL-POPLITEAL BYPASS GRAFT  1994   dr Amedeo Plenty  . I&D EXTREMITY Right 01/09/2015   Procedure: IRRIGATION AND DEBRIDEMENT RIGHT THUMB/HAND ;  Surgeon: Roseanne Kaufman, MD;  Location: [place];  Service: Orthopedics;  Laterality: Right;  . LEFT HEART CATHETERIZATION WITH CORONARY ANGIOGRAM N/A 03/06/2014   Procedure: LEFT HEART CATHETERIZATION WITH CORONARY ANGIOGRAM;  Surgeon: Peter M Martinique, MD;  [facility];  Service: Cardiovascular;  Laterality: N/A;  midLAD 50-60%; D1 20-30%; mCFX 30%; pRCA 30%;  ef 55-65%  . LUMBAR SPINE SURGERY  1980's  . TRANSURETHRAL RESECTION OF BLADDER TUMOR N/A 11/26/2016   Procedure: TRANSURETHRAL RESECTION OF BLADDER TUMOR (TURBT)/ left ureteroscopy;  Surgeon: [name], MD;  Location[facility];  Service: Urology;  Laterality: N/A;   Family History  Problem Relation Age of Onset  . Heart failure Father   . COPD Father   . Dementia Mother    Social History   Socioeconomic History  . Marital status: Married    Spouse name: Silva Bandy  . Number of children: 3  . Years of education: None  . Highest education level: None  Social Needs  . Financial resource strain: Not hard at all  . Food insecurity - worry: Never true  . Food insecurity - inability: Never true  . Transportation needs - medical: No  . Transportation needs - non-medical: No  Occupational History  . Occupation: Geologist, engineering: [employer]  . Occupation: Cut Furniture conservator/restorer    Comment: Worked with dad and with other companies  for 30+ years  Tobacco Use  . Smoking status: Former Smoker    Packs/day: 1.50    Years: 30.00    Pack years: 45.00    Types: Cigarettes    Last attempt to quit: 04/20/1992    Years since quitting: 25.2  . Smokeless tobacco: Never Used  Substance and Sexual Activity  . Alcohol use: Yes    Alcohol/week: 0.0 oz    Comment: occasional  . Drug use: No  . Sexual activity: None  Other Topics Concern  . 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   Tobacco Counseling Patient is a former smoker   Clinical Intake:  Pre-visit preparation completed: No  Pain : No/denies pain     BMI - recorded: 25.5 Nutritional Status: BMI 25 -29 Overweight Nutritional Risks: None Diabetes: No  How often do you need to have someone help you when you read instructions, pamphlets, or other written materials from your doctor or pharmacy?: 1 - Never  Interpreter Needed?: No     Activities of Daily Living In your present state of health, do you have any difficulty performing the following activities: 07/02/2017 06/30/2017  Hearing? - Y  Comment - Hearing aids in both ears  Vision? - Y  Comment - Reading glasses  Difficulty  concentrating or making decisions? - N  Walking or climbing stairs? - N  Dressing or bathing? - N  Doing errands, shopping? - N  Conservation officer, nature and eating ? N -  Using the Toilet? N -  In the past six months, have you accidently leaked urine? N -  Do you have problems with loss of bowel control? N -  Managing your Medications? N -  Managing your Finances? N -  Housekeeping or managing your Housekeeping? N -  Some recent data might be hidden     Immunizations and Health Maintenance  There is no immunization history on file for this patient. Health Maintenance Due  Topic Date Due  . Hepatitis C Screening  15-Sep-1945  . TETANUS/TDAP  03/15/1967  . COLONOSCOPY  03/14/1998  . PNA vac Low Risk Adult (1 of 2 - PCV13) 03/14/2013  . INFLUENZA VACCINE  11/18/2016    Patient Care Team: Timmothy Euler, MD as PCP - General (Family Medicine)  Indicate any recent Medical Services you may have received from other than [facility] providers in the past year (date may be approximate).    Assessment:   This is a routine wellness examination for [place].  Hearing/Vision screen Patient does have hearing difficulties. Wears hearing aids   Dietary issues and exercise activities discussed: Current Exercise Habits: The patient does not participate in regular exercise at present(The patient is very active at home but is having trouble with SOB at times), Exercise limited by: respiratory conditions(s)  Goals    . Exercise 150 min/wk Moderate Activity      Depression Screen PHQ 2/9 Scores 07/02/2017 10/06/2016 09/03/2016 08/12/2016  PHQ - 2 Score 0 0 0 0    Fall Risk Fall Risk  07/02/2017 10/06/2016 09/03/2016 08/12/2016 03/13/2016  Falls in the past year? No No No No No    Is the patient's home free of loose throw rugs in walkways, pet beds, electrical cords, etc?   yes      Grab bars in the bathroom? no      Handrails on the stairs?   yes      Adequate lighting?   yes  Cognitive  Function: MMSE - Mini Mental State Exam 07/02/2017  Orientation to time 5  Orientation to Place 5  Registration 3  Attention/ Calculation 5  Recall 3  Language- name 2 objects 2  Language- repeat 1  Language- follow 3 step command 3  Language- read & follow direction 1  Write a sentence 1  Copy design 1  Total score 30        Screening Tests Health Maintenance  Topic Date Due  . Hepatitis C Screening  Sep 18, 1945  . TETANUS/TDAP  03/15/1967  . COLONOSCOPY  03/14/1998  . PNA vac Low Risk Adult (1 of 2 - PCV13) 03/14/2013  . INFLUENZA VACCINE  11/18/2016    Qualifies for Shingles Vaccine? Patient states that he has had both shingles vaccinations through the [facility] [place] but is unsure of dates  Cancer Screenings: Lung: Low Dose CT Chest recommended if Age 45-80 years, 30 pack-year currently smoking OR have quit w/in 15years. Patient does not qualify. Colorectal: Patient states that he has had a colonoscopy within the last 10 years and is not due for 3-4 years.   Additional Screenings:   Hepatitis B/HIV/Syphillis: Hepatitis C Screening: \  Will discuss these screening at next office visit      Plan:     I have personally reviewed and noted the following in the patient's chart:   . Medical and social history . Use of alcohol, tobacco or illicit drugs  . Current medications and supplements . Functional ability and status . Nutritional status . Physical activity . Advanced directives . List of other physicians . Hospitalizations, surgeries, and ER visits in previous 12 months . Vitals . Screenings to include cognitive, depression, and falls . Referrals and appointments  In addition, I have reviewed and discussed with patient certain preventive protocols, quality metrics, and best practice recommendations. A written personalized care plan for preventive services as well as general preventive health recommendations were provided to patient.     Rolena Infante,  LPN   3/71/0626   I have reviewed and agree with the above AWV documentation.   I have reviewed and agree with the above AWV documentation.   Terald Sleeper PA-C [place] [address]  [place], [place] [address] [phone]

## 2017-07-07 NOTE — Progress Notes (Signed)
Went back in a corrected colorectal screening. Should be good to sign. If not please let me know

## 2017-07-07 NOTE — Progress Notes (Signed)
Can you do the colorectal answers near the bottom?

## 2017-07-20 ENCOUNTER — Ambulatory Visit: Payer: PPO | Admitting: Cardiology

## 2017-08-12 ENCOUNTER — Encounter: Payer: Self-pay | Admitting: Cardiology

## 2017-08-25 NOTE — Progress Notes (Signed)
08/26/2017   PCP: [facility] in [place].  Chief Complaint  Patient presents with  . Shortness of Breath  . Chest Pain    Primary Cardiologist: Dr. Bliss Tsang Martinique   HPI:  72 y.o. Male seen for evaluation of exertional chest pain and dyspnea.  Last seen in  May 2016. 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 Admitted with chest pain on 03/05/14. No MI and cardiac cath with non obstructive disease.  He does have 50-60% in LAD.  He was started on Statins.    He has been followed at the [place]. He notes a 2-3 month history of dyspnea and chest pain on exertion. Never at rest. Gets SOB just carrying groceries. Noted BP was markedly elevated to 220. His metoprolol was doubled to 100 mg bid. BP still elevated. He does have chronic claudication but this has not changed significantly. He does not smoke. He does his own yard work but is more limited now.   Allergies  Allergen Reactions  . Indomethacin Other (See Comments)    Nose bleeds    Current Outpatient Medications  Medication Sig Dispense Refill  . acetaminophen (TYLENOL) 500 MG tablet Take 1,500 mg by mouth every 6 (six) hours as needed for moderate pain.    Marland Kitchen allopurinol (ZYLOPRIM) 100 MG tablet Take 100 mg by mouth every morning.     Marland Kitchen amLODipine (NORVASC) 10 MG tablet Take 10 mg by mouth every morning.     Marland Kitchen aspirin EC 81 MG tablet Take 81 mg by mouth daily.    Marland Kitchen atorvastatin (LIPITOR) 80 MG tablet Take 40 mg by mouth daily.    . colchicine 0.6 MG tablet Take 1 tablet (0.6 mg total) by mouth daily. 90 tablet 3  . metoprolol tartrate (LOPRESSOR) 100 MG tablet Take 100 mg by mouth 2 (two) times daily.    . nitroGLYCERIN (NITROSTAT) 0.4 MG SL tablet Place 1 tablet (0.4 mg total) under the tongue every 5 (five) minutes as needed for chest pain. 25 tablet 11  . omeprazole (PRILOSEC) 20 MG capsule Take 1 capsule (20 mg total) by mouth daily as needed (acid reflux). 90 capsule 1  .  losartan (COZAAR) 50 MG tablet Take 1 tablet (50 mg total) by mouth daily. 30 tablet 3   No current facility-administered medications for this visit.    Facility-Administered Medications Ordered in Other Visits  Medication Dose Route Frequency Provider Last Rate Last Dose  . epirubicin (ELLENCE) 50 mg in sodium chloride 0.9 % bladder instillation  50 mg Bladder Instillation Once [name], MD        Past Medical History:  Diagnosis Date  . Bladder tumor   . CAD in native artery followed by pcp at the [place] in [place] 11-13-2016 pt denies S&S   per cardiac cath 03-06-2014  by dr Martinique--  moderate non-obstructive cad  . Emphysema/COPD ([place])   . GERD (gastroesophageal reflux disease)   . Gout    per pt currently stable 11-13-2016  . History of kidney stones   . History of MRSA infection    right hand abscess 09/ 2016  . Hypertension   . Left inguinal hernia   . PAD (peripheral artery disease) ([place]) first dx 1994---  was followed by vascular dr Amedeo Plenty   s/p  fem-pop bypass graft 1994 and 2001/  pt is followed by the [facility] in [facility] 11-13-2016 denies s&s  .  Rotator cuff syndrome of left shoulder   . Wears hearing aid in both ears     Past Surgical History:  Procedure Laterality Date  . ANTERIOR CERVICAL DECOMP/DISCECTOMY FUSION  02/1999  . AORTA - BILATERAL FEMORAL ARTERY BYPASS GRAFT  2001  dr Amedeo Plenty  . APPENDECTOMY  1959  . CYSTOSCOPY WITH STENT PLACEMENT Left 11/26/2016   Procedure: CYSTOSCOPY WITH left double J STENT PLACEMENT;  Surgeon: [name], MD;  Location: [facility];  Service: Urology;  Laterality: Left;  . FEMORAL-POPLITEAL BYPASS GRAFT  1994   dr Amedeo Plenty  . I&D EXTREMITY Right 01/09/2015   Procedure: IRRIGATION AND DEBRIDEMENT RIGHT THUMB/HAND ;  Surgeon: Roseanne Kaufman, MD;  Location: [place];  Service: Orthopedics;  Laterality: Right;  . LEFT HEART CATHETERIZATION WITH CORONARY ANGIOGRAM N/A 03/06/2014   Procedure: LEFT HEART CATHETERIZATION WITH  CORONARY ANGIOGRAM;  Surgeon: Ashari Llewellyn M Martinique, MD;  Location: [facility] CATH LAB;  Service: Cardiovascular;  Laterality: N/A;  midLAD 50-60%; D1 20-30%; mCFX 30%; pRCA 30%;  ef 55-65%  . LUMBAR SPINE SURGERY  1980's  . TRANSURETHRAL RESECTION OF BLADDER TUMOR N/A 11/26/2016   Procedure: TRANSURETHRAL RESECTION OF BLADDER TUMOR (TURBT)/ left ureteroscopy;  Surgeon: [name], MD;  Location: [facility];  Service: Urology;  Laterality: N/A;    ROS:As noted in HPI. Otherwise ROS is negative.   Wt Readings from Last 3 Encounters:  08/26/17 178 lb 12.8 oz (81.1 kg)  07/02/17 187 lb (84.8 kg)  03/31/17 165 lb (74.8 kg)    PHYSICAL EXAM BP (!) 158/82   Pulse (!) 54   Ht 5\' 10"  (1.778 m)   Wt 178 lb 12.8 oz (81.1 kg)   BMI 25.66 kg/m  GENERAL:  Well appearing WM in NAD HEENT:  PERRL, EOMI, sclera are clear. Oropharynx is clear. NECK:  No jugular venous distention, carotid upstroke brisk and symmetric, no bruits, no thyromegaly or adenopathy LUNGS:  Clear to auscultation bilaterally CHEST:  Unremarkable HEART:  RRR,  PMI not displaced or sustained,S1 and S2 within normal limits, no S3, no S4: no clicks, no rubs, no murmurs ABD:  Soft, nontender. BS +, no masses or bruits. No hepatomegaly, no splenomegaly EXT:  1+ pedal  pulses, no edema, no cyanosis no clubbing SKIN:  Warm and dry.  No rashes NEURO:  Alert and oriented x 3. Cranial nerves II through XII intact. PSYCH:  Cognitively intact      Laboratory data:  Lab Results  Component Value Date   WBC 8.8 10/11/2016   HGB 15.3 11/26/2016   HCT 45.0 11/26/2016   PLT 192 10/11/2016   GLUCOSE 103 (H) 11/26/2016   CHOL 138 08/12/2015   TRIG 121 08/12/2015   HDL 30 (L) 08/12/2015   LDLCALC 84 08/12/2015   ALT 42 10/11/2016   AST 31 10/11/2016   NA 143 11/26/2016   K 4.0 11/26/2016   CL 105 11/26/2016   CREATININE 0.90 11/26/2016   BUN 17 11/26/2016   CO2 26 10/11/2016   TSH 1.040 03/05/2014   INR 1.15 03/05/2014    HGBA1C 6.4 (H) 03/05/2014   Ecg today shows NSR rate 54. Otherwise normal. I have personally reviewed and interpreted this study.   ASSESSMENT AND PLAN 1. 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 Now with symptoms of exertional dyspnea and some chest pain. This may be related to elevated BP. On good antianginal therapy with high dose amlodipine and metoprolol. Will arrange for a lexiscan myoview. if there is ischemia will consider  repeat cardiac cath. Continue beta blocker, ASA, and statin.  2. HTN poorly controlled. Will add losartan 50 mg daily and monitor.  3. Hypercholesterolemia.  On high dose  lipitor. Labs followed at the [facility] 4. PAD s/p bifemoral BPG.   I will follow up in 4 weeks.

## 2017-08-26 ENCOUNTER — Ambulatory Visit (INDEPENDENT_AMBULATORY_CARE_PROVIDER_SITE_OTHER): Payer: PPO | Admitting: Cardiology

## 2017-08-26 ENCOUNTER — Encounter: Payer: Self-pay | Admitting: Cardiology

## 2017-08-26 VITALS — BP 158/82 | HR 54 | Ht 70.0 in | Wt 178.8 lb

## 2017-08-26 DIAGNOSIS — R0609 Other forms of dyspnea: Secondary | ICD-10-CM

## 2017-08-26 DIAGNOSIS — R072 Precordial pain: Secondary | ICD-10-CM

## 2017-08-26 DIAGNOSIS — E7800 Pure hypercholesterolemia, unspecified: Secondary | ICD-10-CM

## 2017-08-26 DIAGNOSIS — I25118 Atherosclerotic heart disease of native coronary artery with other forms of angina pectoris: Secondary | ICD-10-CM | POA: Diagnosis not present

## 2017-08-26 DIAGNOSIS — I10 Essential (primary) hypertension: Secondary | ICD-10-CM | POA: Diagnosis not present

## 2017-08-26 MED ORDER — LOSARTAN POTASSIUM 50 MG PO TABS: 50.0000 mg | ORAL_TABLET | Freq: Every day | ORAL | 3 refills | Status: DC

## 2017-08-26 NOTE — Addendum Note (Signed)
Addended by: Kathyrn Lass on: 08/26/2017 03:39 PM   Modules accepted: Orders

## 2017-08-26 NOTE — Patient Instructions (Signed)
We will add losartan 50 mg daily for your blood pressure  We will schedule you for a nuclear stress test  I will see you back in 4 weeks.

## 2017-08-27 ENCOUNTER — Telehealth (HOSPITAL_COMMUNITY): Payer: Self-pay

## 2017-08-27 NOTE — Telephone Encounter (Signed)
Encounter complete. 

## 2017-09-01 ENCOUNTER — Ambulatory Visit (HOSPITAL_COMMUNITY)
Admission: RE | Admit: 2017-09-01 | Discharge: 2017-09-01 | Disposition: A | Discharge status: Home / Self Care | Payer: PPO | Source: Ambulatory Visit | Attending: Internal Medicine | Admitting: Internal Medicine

## 2017-09-01 DIAGNOSIS — Z87891 Personal history of nicotine dependence: Secondary | ICD-10-CM | POA: Diagnosis not present

## 2017-09-01 DIAGNOSIS — K219 Gastro-esophageal reflux disease without esophagitis: Secondary | ICD-10-CM | POA: Insufficient documentation

## 2017-09-01 DIAGNOSIS — J449 Chronic obstructive pulmonary disease, unspecified: Secondary | ICD-10-CM | POA: Diagnosis not present

## 2017-09-01 DIAGNOSIS — I739 Peripheral vascular disease, unspecified: Secondary | ICD-10-CM | POA: Diagnosis not present

## 2017-09-01 DIAGNOSIS — I10 Essential (primary) hypertension: Secondary | ICD-10-CM | POA: Diagnosis not present

## 2017-09-01 DIAGNOSIS — R072 Precordial pain: Secondary | ICD-10-CM | POA: Diagnosis not present

## 2017-09-01 DIAGNOSIS — I25118 Atherosclerotic heart disease of native coronary artery with other forms of angina pectoris: Secondary | ICD-10-CM | POA: Insufficient documentation

## 2017-09-01 DIAGNOSIS — E785 Hyperlipidemia, unspecified: Secondary | ICD-10-CM | POA: Diagnosis not present

## 2017-09-01 LAB — MYOCARDIAL PERFUSION IMAGING
CHL CUP NUCLEAR SDS: 2
LV dias vol: 115 mL (ref 62–150)
LV sys vol: 52 mL
NUC STRESS TID: 1.22
Peak HR: 80 {beats}/min
Rest HR: 56 {beats}/min
SRS: 1
SSS: 3

## 2017-09-01 MED ORDER — TECHNETIUM TC 99M TETROFOSMIN IV KIT
8.7000 | PACK | Freq: Once | INTRAVENOUS | Status: AC | PRN
  Administered 2017-09-01: 8.7 via INTRAVENOUS
  Filled 2017-09-01: qty 9

## 2017-09-01 MED ORDER — TECHNETIUM TC 99M TETROFOSMIN IV KIT
26.9000 | PACK | Freq: Once | INTRAVENOUS | Status: AC | PRN
  Administered 2017-09-01: 26.9 via INTRAVENOUS
  Filled 2017-09-01: qty 27

## 2017-09-01 MED ORDER — REGADENOSON 0.4 MG/5ML IV SOLN
0.4000 mg | Freq: Once | INTRAVENOUS | Status: AC
  Administered 2017-09-01: 0.4 mg via INTRAVENOUS

## 2017-09-21 NOTE — Progress Notes (Signed)
09/24/2017   PCP: [facility] in [place].  Chief Complaint  Patient presents with  . Shortness of Breath    Primary Cardiologist: Dr. Tracina Beaumont Martinique   HPI:  72 y.o. Male seen for evaluation of exertional chest pain and dyspnea.  Last seen in  May 2016. 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 Admitted with chest pain on 03/05/14. No MI and cardiac cath with non obstructive disease.  He does have 50-60% in LAD.  He was started on Statins.  Repeat Myoview in May 2019 was normal.   He has been followed at the [facility]. He continues to complain of DOE. He has stable claudication in both hips. No follow up on PAD in years. BP was high but has improved with addition of losartan. Labs followed at the [facility] and are not available to review.    Allergies  Allergen Reactions  . Indomethacin Other (See Comments)    Nose bleeds    Current Outpatient Medications  Medication Sig Dispense Refill  . acetaminophen (TYLENOL) 500 MG tablet Take 1,500 mg by mouth every 6 (six) hours as needed for moderate pain.    Marland Kitchen allopurinol (ZYLOPRIM) 100 MG tablet Take 100 mg by mouth every morning.     Marland Kitchen amLODipine (NORVASC) 10 MG tablet Take 10 mg by mouth every morning.     Marland Kitchen aspirin EC 81 MG tablet Take 81 mg by mouth daily.    Marland Kitchen atorvastatin (LIPITOR) 80 MG tablet Take 40 mg by mouth daily.    . colchicine 0.6 MG tablet Take 1 tablet (0.6 mg total) by mouth daily. 90 tablet 3  . losartan (COZAAR) 50 MG tablet Take 1 tablet (50 mg total) by mouth daily. 90 tablet 3  . metoprolol tartrate (LOPRESSOR) 100 MG tablet Take 100 mg by mouth 2 (two) times daily.    . nitroGLYCERIN (NITROSTAT) 0.4 MG SL tablet Place 1 tablet (0.4 mg total) under the tongue every 5 (five) minutes as needed for chest pain. 25 tablet 11  . omeprazole (PRILOSEC) 20 MG capsule Take 1 capsule (20 mg total) by mouth daily as needed (acid reflux). 90 capsule 1   No current facility-administered  medications for this visit.    Facility-Administered Medications Ordered in Other Visits  Medication Dose Route Frequency Provider Last Rate Last Dose  . epirubicin (ELLENCE) 50 mg in sodium chloride 0.9 % bladder instillation  50 mg Bladder Instillation Once [name], MD        Past Medical History:  Diagnosis Date  . Bladder tumor   . CAD in native artery followed by pcp at the [facility] in [place] 11-13-2016 pt denies S&S   per cardiac cath 03-06-2014  by dr Martinique--  moderate non-obstructive cad  . Emphysema/COPD ([place])   . GERD (gastroesophageal reflux disease)   . Gout    per pt currently stable 11-13-2016  . History of kidney stones   . History of MRSA infection    right hand abscess 09/ 2016  . Hypertension   . Left inguinal hernia   . PAD (peripheral artery disease) ([place]) first dx 1994---  was followed by vascular dr Amedeo Plenty   s/p  fem-pop bypass graft 1994 and 2001/  pt is followed by the [facility] in [facility] 11-13-2016 denies s&s  . Rotator cuff syndrome of left shoulder   . Wears hearing aid in both ears  Past Surgical History:  Procedure Laterality Date  . ANTERIOR CERVICAL DECOMP/DISCECTOMY FUSION  02/1999  . AORTA - BILATERAL FEMORAL ARTERY BYPASS GRAFT  2001  dr Amedeo Plenty  . APPENDECTOMY  1959  . CYSTOSCOPY WITH STENT PLACEMENT Left 11/26/2016   Procedure: CYSTOSCOPY WITH left double J STENT PLACEMENT;  Surgeon: [name], MD;  Location[facility];  Service: Urology;  Laterality: Left;  . FEMORAL-POPLITEAL BYPASS GRAFT  1994   dr Amedeo Plenty  . I&D EXTREMITY Right 01/09/2015   Procedure: IRRIGATION AND DEBRIDEMENT RIGHT THUMB/HAND ;  Surgeon: Roseanne Kaufman, MD;  Location: [place];  Service: Orthopedics;  Laterality: Right;  . LEFT HEART CATHETERIZATION WITH CORONARY ANGIOGRAM N/A 03/06/2014   Procedure: LEFT HEART CATHETERIZATION WITH CORONARY ANGIOGRAM;  Surgeon: Stevan Eberwein M Martinique, MD;  Location: [facility] CATH LAB;  Service: Cardiovascular;  Laterality: N/A;  midLAD  50-60%; D1 20-30%; mCFX 30%; pRCA 30%;  ef 55-65%  . LUMBAR SPINE SURGERY  1980's  . TRANSURETHRAL RESECTION OF BLADDER TUMOR N/A 11/26/2016   Procedure: TRANSURETHRAL RESECTION OF BLADDER TUMOR (TURBT)/ left ureteroscopy;  Surgeon: [name], MD;  Location: [facility];  Service: Urology;  Laterality: N/A;    ROS:As noted in HPI. Otherwise ROS is negative.   Wt Readings from Last 3 Encounters:  09/24/17 183 lb 9.6 oz (83.3 kg)  09/01/17 178 lb (80.7 kg)  08/26/17 178 lb 12.8 oz (81.1 kg)    PHYSICAL EXAM BP (!) 142/82   Pulse (!) 56   Ht 5\' 10"  (1.778 m)   Wt 183 lb 9.6 oz (83.3 kg)   BMI 26.34 kg/m  GENERAL:  Well appearing WM in NAD HEENT:  PERRL, EOMI, sclera are clear. Oropharynx is clear. NECK:  No jugular venous distention, carotid upstroke brisk and symmetric, no bruits, no thyromegaly or adenopathy LUNGS:  Clear to auscultation bilaterally CHEST:  Unremarkable HEART:  RRR,  PMI not displaced or sustained,S1 and S2 within normal limits, no S3, no S4: no clicks, no rubs, no murmurs ABD:  Soft, nontender. BS +, no masses or bruits. No hepatomegaly, no splenomegaly EXT:  1+ pedal  pulses, no edema, no cyanosis no clubbing SKIN:  Warm and dry.  No rashes NEURO:  Alert and oriented x 3. Cranial nerves II through XII intact. PSYCH:  Cognitively intact      Laboratory data:  Lab Results  Component Value Date   WBC 8.8 10/11/2016   HGB 15.3 11/26/2016   HCT 45.0 11/26/2016   PLT 192 10/11/2016   GLUCOSE 103 (H) 11/26/2016   CHOL 138 08/12/2015   TRIG 121 08/12/2015   HDL 30 (L) 08/12/2015   LDLCALC 84 08/12/2015   ALT 42 10/11/2016   AST 31 10/11/2016   NA 143 11/26/2016   K 4.0 11/26/2016   CL 105 11/26/2016   CREATININE 0.90 11/26/2016   BUN 17 11/26/2016   CO2 26 10/11/2016   TSH 1.040 03/05/2014   INR 1.15 03/05/2014   HGBA1C 6.4 (H) 03/05/2014    Lexiscan Myoview 09/01/17: Study Highlights    The left ventricular ejection fraction is  normal (55-65%).  Nuclear stress EF: 55%.  There was no ST segment deviation noted during stress.  The study is normal.  This is a low risk study.    ASSESSMENT AND PLAN 1. 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 Recent Myoveiw was normal. I suspect symptoms related to hypertensive heart disease and possible COPD- remote history of tobacco abuse.  On good antianginal therapy with high  dose amlodipine and metoprolol.   2. HTN improved control on losartan. Continue current therapy. 3. Hypercholesterolemia.  On high dose  lipitor. Labs followed at the [facility]. Asked patient to bring me a copy.  4. PAD s/p bifemoral BPG. Will refer back to [facility] to evaluate.  5. DOE. Will arrange PFTs.  I will follow up in 6 months.

## 2017-09-24 ENCOUNTER — Encounter: Payer: Self-pay | Admitting: Cardiology

## 2017-09-24 ENCOUNTER — Ambulatory Visit (INDEPENDENT_AMBULATORY_CARE_PROVIDER_SITE_OTHER): Payer: PPO | Admitting: Cardiology

## 2017-09-24 VITALS — BP 142/82 | HR 56 | Ht 70.0 in | Wt 183.6 lb

## 2017-09-24 DIAGNOSIS — I739 Peripheral vascular disease, unspecified: Secondary | ICD-10-CM

## 2017-09-24 DIAGNOSIS — E7800 Pure hypercholesterolemia, unspecified: Secondary | ICD-10-CM

## 2017-09-24 DIAGNOSIS — R0609 Other forms of dyspnea: Secondary | ICD-10-CM

## 2017-09-24 DIAGNOSIS — I10 Essential (primary) hypertension: Secondary | ICD-10-CM

## 2017-09-24 NOTE — Patient Instructions (Addendum)
Schedule appointment with V[facility] Vein Specialist   Schedule PFT's   Fax copy of [facility] labs to [phone]    Your physician wants you to follow-up in: 6 months. You will receive a reminder letter in the mail two months in advance. If you don't receive a letter, please call our office to schedule the follow-up appointment.

## 2017-09-29 ENCOUNTER — Telehealth: Payer: Self-pay | Admitting: Cardiology

## 2017-09-29 ENCOUNTER — Other Ambulatory Visit: Payer: Self-pay

## 2017-09-29 DIAGNOSIS — I739 Peripheral vascular disease, unspecified: Secondary | ICD-10-CM

## 2017-09-29 NOTE — Telephone Encounter (Signed)
Called [facility] to find out about the referral for this patient.  [facility] stated they do have the referral and will be calling to schedule the patient.  I will contact the office again on 10-01-17 to get an update.

## 2017-10-05 ENCOUNTER — Ambulatory Visit (HOSPITAL_COMMUNITY)
Admission: RE | Admit: 2017-10-05 | Discharge: 2017-10-05 | Disposition: A | Discharge status: Home / Self Care | Payer: PPO | Source: Ambulatory Visit | Attending: Cardiology | Admitting: Cardiology

## 2017-10-05 DIAGNOSIS — I10 Essential (primary) hypertension: Secondary | ICD-10-CM

## 2017-10-05 DIAGNOSIS — E7800 Pure hypercholesterolemia, unspecified: Secondary | ICD-10-CM | POA: Diagnosis not present

## 2017-10-05 DIAGNOSIS — I739 Peripheral vascular disease, unspecified: Secondary | ICD-10-CM | POA: Diagnosis not present

## 2017-10-05 DIAGNOSIS — R0609 Other forms of dyspnea: Secondary | ICD-10-CM

## 2017-10-05 LAB — PULMONARY FUNCTION TEST
DL/VA % PRED: 77 %
DL/VA: 3.56 ml/min/mmHg/L
DLCO unc % pred: 60 %
DLCO unc: 19.64 ml/min/mmHg
FEF 25-75 POST: 1.32 L/s
FEF 25-75 Pre: 0.93 L/sec
FEF2575-%Change-Post: 41 %
FEF2575-%Pred-Post: 52 %
FEF2575-%Pred-Pre: 37 %
FEV1-%CHANGE-POST: 8 %
FEV1-%PRED-PRE: 60 %
FEV1-%Pred-Post: 65 %
FEV1-PRE: 1.98 L
FEV1-Post: 2.15 L
FEV1FVC-%Change-Post: 0 %
FEV1FVC-%Pred-Pre: 84 %
FEV6-%Change-Post: 9 %
FEV6-%PRED-PRE: 72 %
FEV6-%Pred-Post: 79 %
FEV6-POST: 3.34 L
FEV6-PRE: 3.06 L
FEV6FVC-%Change-Post: 1 %
FEV6FVC-%PRED-POST: 103 %
FEV6FVC-%PRED-PRE: 101 %
FVC-%Change-Post: 7 %
FVC-%Pred-Post: 76 %
FVC-%Pred-Pre: 71 %
FVC-POST: 3.41 L
FVC-PRE: 3.17 L
POST FEV6/FVC RATIO: 98 %
PRE FEV6/FVC RATIO: 96 %
Post FEV1/FVC ratio: 63 %
Pre FEV1/FVC ratio: 62 %
RV % pred: 174 %
RV: 4.26 L
TLC % PRED: 112 %
TLC: 7.87 L

## 2017-10-05 MED ORDER — ALBUTEROL SULFATE (2.5 MG/3ML) 0.083% IN NEBU
2.5000 mg | INHALATION_SOLUTION | Freq: Once | RESPIRATORY_TRACT | Status: AC
  Administered 2017-10-05: 2.5 mg via RESPIRATORY_TRACT

## 2017-10-06 ENCOUNTER — Telehealth: Payer: Self-pay | Admitting: *Deleted

## 2017-10-06 DIAGNOSIS — R0609 Other forms of dyspnea: Principal | ICD-10-CM

## 2017-10-06 DIAGNOSIS — R942 Abnormal results of pulmonary function studies: Secondary | ICD-10-CM

## 2017-10-06 NOTE — Telephone Encounter (Signed)
-----   Message from Peter M Martinique, MD sent at 10/05/2017  3:59 PM EDT ----- PFTs show moderate obstructive pulmonary disease and reduced diffusion capacity. This is probably why he is short of breath. I would refer to pulmonary.  [name] MD, [facility]

## 2017-10-06 NOTE — Telephone Encounter (Addendum)
Spoke to patient's wife. PFT-Result given . Verbalized understanding Aware scheduler from [facility].will contact patient.  Patient lives near the [place].

## 2017-10-22 IMAGING — CT CT SHOULDER*L* W/CM
3 of 4 series · 11 of 33 positions shown, 13 images · non-contrast
Comparison: None.

CLINICAL DATA: Left shoulder pain.

EXAM:
CT ARTHROGRAPHY OF THE LEFT SHOULDER
TECHNIQUE: Multidetector CT imaging was performed following the standard
protocol after injection of dilute contrast into the joint.

[Series 7: ax st · axial · 0.35mm/px · z∈[-217,-23]mm · 3 of 98 slices shown, 4 images]
[im 1/98  soft-tissue]
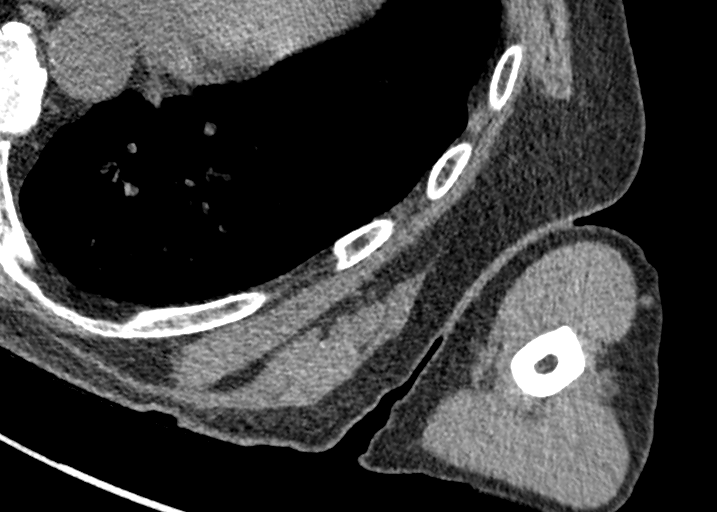
[im 1/98  bone]
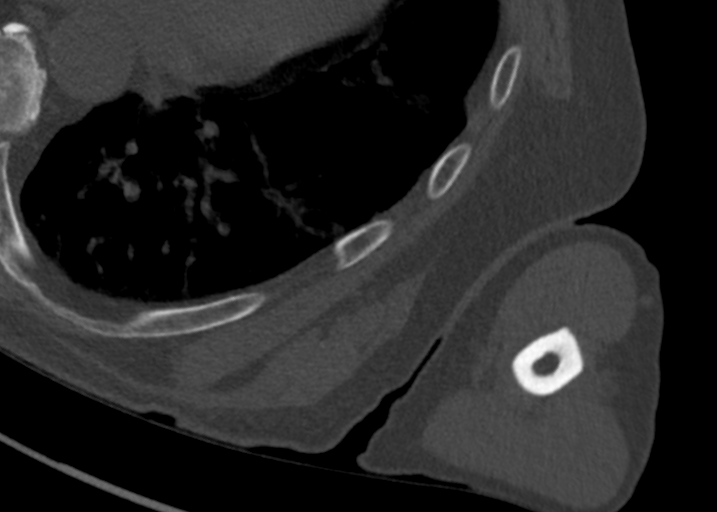
[im 49/98  bone]
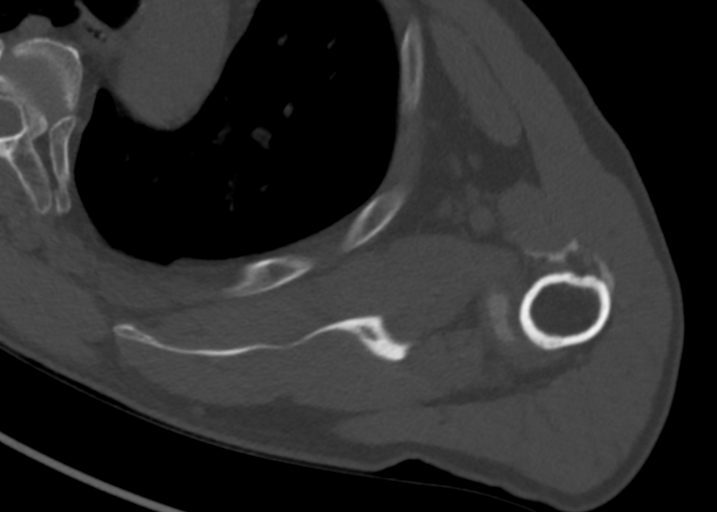
[im 98/98  bone]
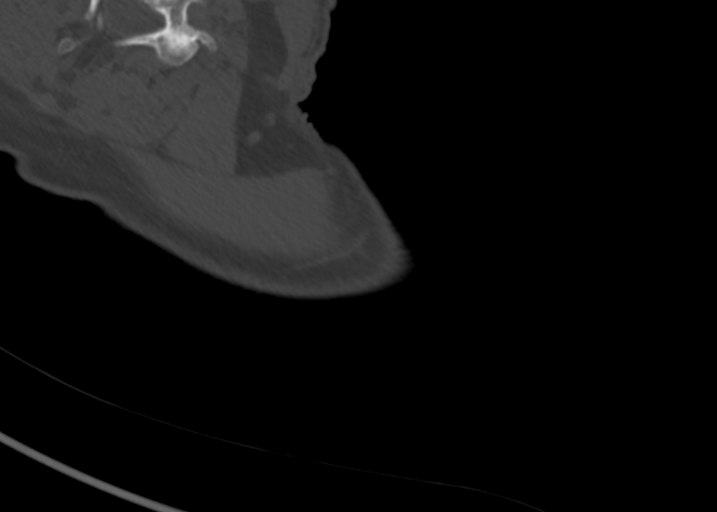

[Series 8: cor st · coronal · 0.42mm/px · 3 of 62 slices shown]
[im 13/62  bone]
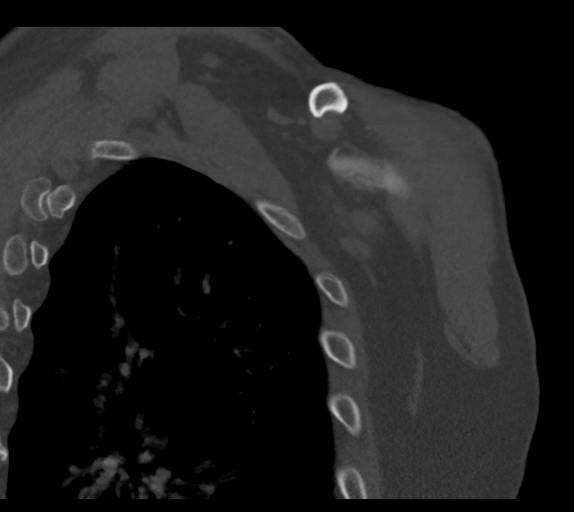
[im 25/62  bone]
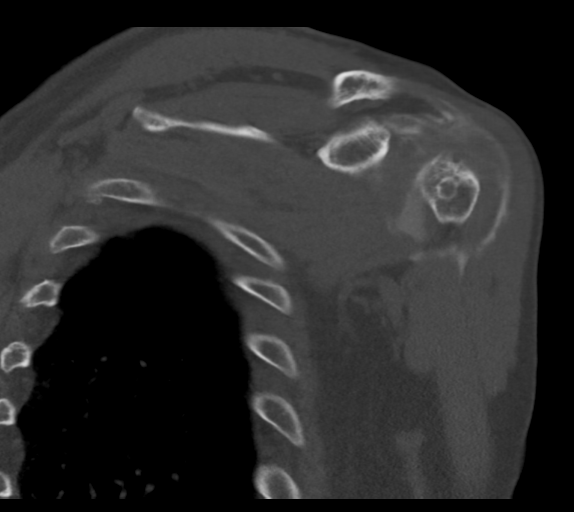
[im 37/62  bone]
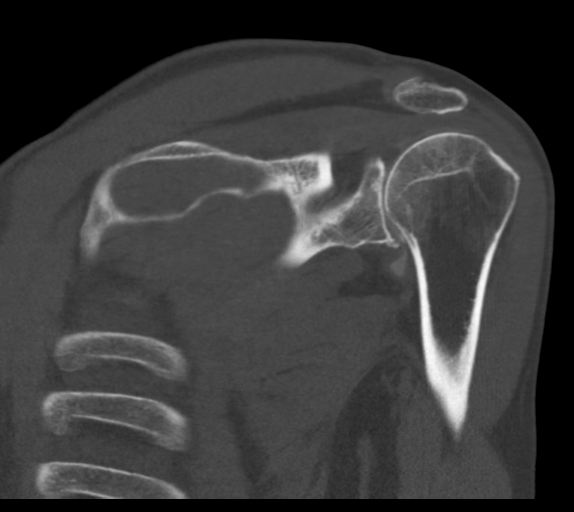

[Series 9: sag st · sagittal · 0.29mm/px · 5 of 103 slices shown, 6 images]
[im 35/103  bone]
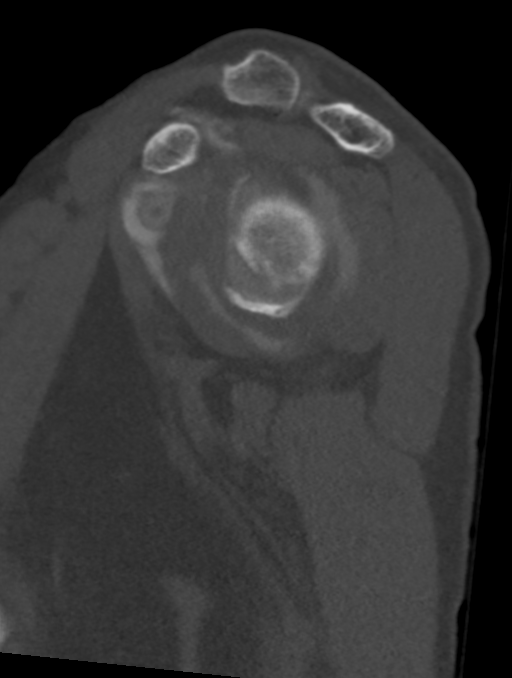
[im 43/103  bone]
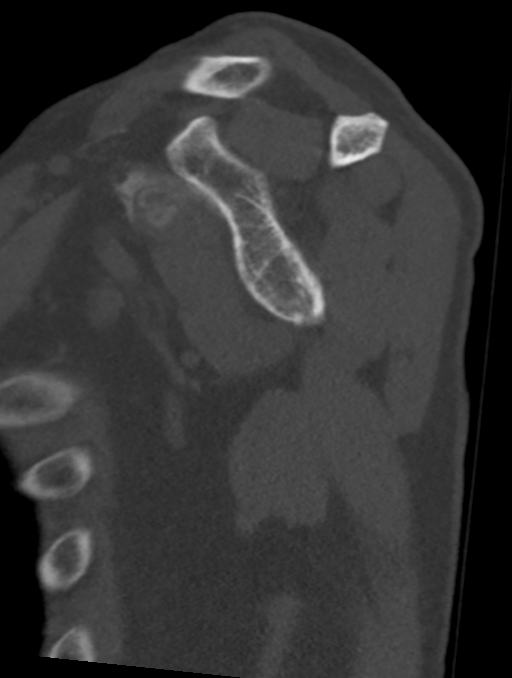
[im 52/103  soft-tissue]
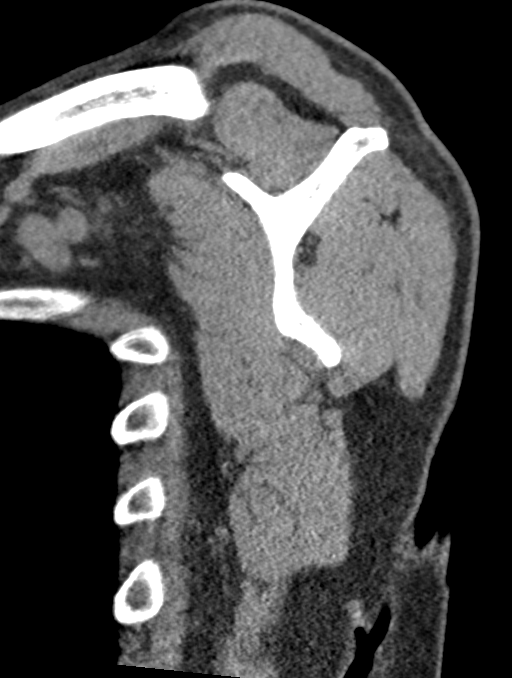
[im 52/103  bone]
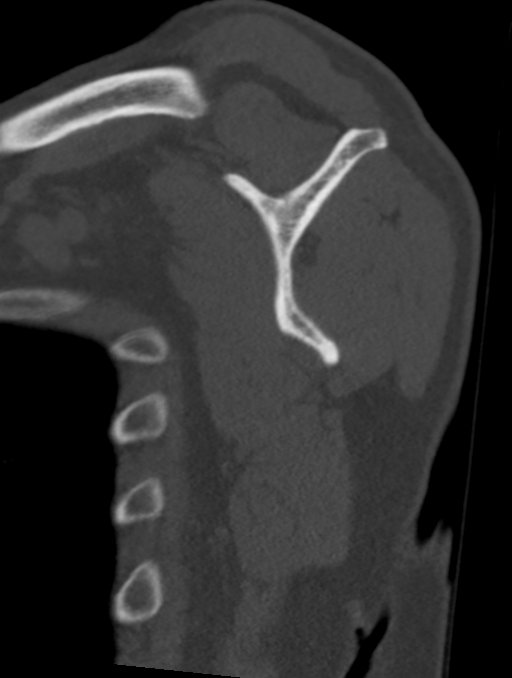
[im 60/103  bone]
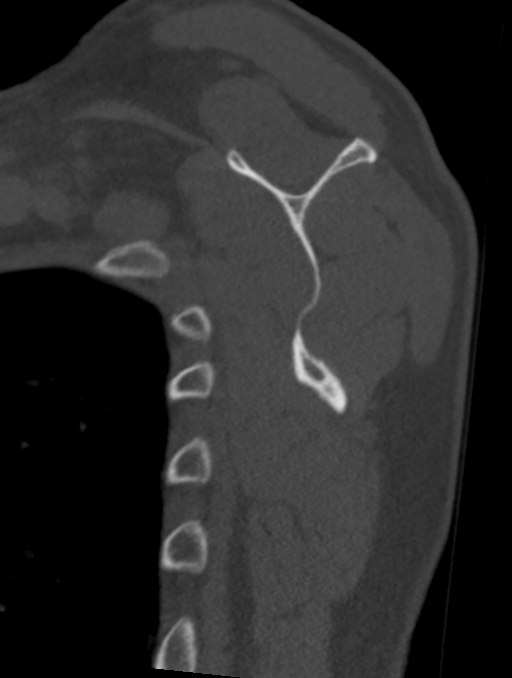
[im 69/103  bone]
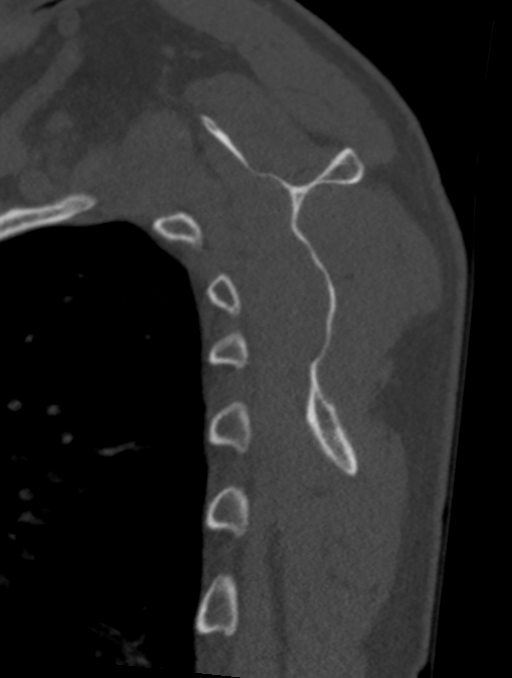

[11 of 33 positions shown; findings below may reference images not displayed]

FINDINGS: There is a iatrogenic contrast in the anterior aspect of the
subdeltoid bursa. I performed the contrast injection. Because of the
dilution of the contrast I increased the concentration and
repositioned the needle. The second injection is in the bursal space
and not within the joint. Initial injection is in the joint and is
adequate for diagnosis.

Rotator cuff:  Intact.

AC joint:  Slight AC joint arthropathy.  Type 1 acromion.

Muscles:  No atrophy.

Long head of the biceps tendon:  Intact.

Labrum:  Intact.

Glenohumeral joint: There is full-thickness cartilage loss of the
posterior aspect of the glenohumeral joint with subcortical cyst
formation in the posterior aspect of the glenoid with marginal
osteophytes on the inferior margin of the humeral head and on the
inferior rim of the glenoid.
IMPRESSION: 1. Moderate glenohumeral joint arthritis.
2. Intact rotator cuff.
3. Slight AC joint arthropathy.

## 2017-10-25 DIAGNOSIS — Z8551 Personal history of malignant neoplasm of bladder: Secondary | ICD-10-CM | POA: Diagnosis not present

## 2017-11-01 DIAGNOSIS — L0291 Cutaneous abscess, unspecified: Secondary | ICD-10-CM | POA: Diagnosis not present

## 2017-11-12 ENCOUNTER — Encounter: Payer: Self-pay | Admitting: Internal Medicine

## 2017-11-12 ENCOUNTER — Ambulatory Visit (INDEPENDENT_AMBULATORY_CARE_PROVIDER_SITE_OTHER)
Admission: RE | Admit: 2017-11-12 | Discharge: 2017-11-12 | Disposition: A | Discharge status: Home / Self Care | Payer: PPO | Source: Ambulatory Visit | Attending: Internal Medicine | Admitting: Internal Medicine

## 2017-11-12 ENCOUNTER — Ambulatory Visit: Payer: PPO | Admitting: Internal Medicine

## 2017-11-12 ENCOUNTER — Telehealth: Payer: Self-pay | Admitting: Internal Medicine

## 2017-11-12 VITALS — BP 144/76 | HR 56 | Ht 70.0 in | Wt 181.0 lb

## 2017-11-12 DIAGNOSIS — J449 Chronic obstructive pulmonary disease, unspecified: Secondary | ICD-10-CM

## 2017-11-12 DIAGNOSIS — I10 Essential (primary) hypertension: Secondary | ICD-10-CM | POA: Diagnosis not present

## 2017-11-12 MED ORDER — BISOPROLOL FUMARATE 5 MG PO TABS: 5.0000 mg | ORAL_TABLET | Freq: Every day | ORAL | 2 refills | Status: DC

## 2017-11-12 NOTE — Progress Notes (Signed)
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 Pulmonary Consult    Referred by Dr. [name]. Pt states DOE x 6 months- gets winded walking up an incline or carrying something heavy.   Dyspnea:  MMRC1 = can walk nl pace, flat grade, can't hurry or go uphills or steps s sob  / esp toting like case of drinks Cough:  occ / daytime Sleep: flat/ one pillow   Has occ cp but not with exertion/ eval by Martinique neg for active ischemia, nl ef 08/2017    No obvious day to day or daytime variability or assoc excess/ purulent sputum or mucus plugs or hemoptysis or   chest tightness, subjective wheeze or overt sinus   Symptoms - occ HB symptoms on prn ppi with or after meals  SABA use: none  without nocturnal  or early am exacerbation  of respiratory  c/o's or need for noct saba. Also denies any obvious fluctuation of symptoms with weather or environmental changes or other aggravating or alleviating factors except as outlined above   No unusual exposure hx or h/o childhood pna/ asthma or knowledge of premature birth.  Current Allergies, Complete Past Medical History, Past Surgical History, Family History, and Social History were reviewed in [employer] record.  ROS  The following are not active complaints unless bolded Hoarseness, sore throat, dysphagia, dental problems, itching, sneezing,  nasal congestion or discharge of excess mucus or purulent secretions, ear ache,   fever, chills, sweats, unintended wt loss or wt gain, classically pleuritic or exertional cp,  orthopnea pnd or arm/hand swelling  or leg swelling, presyncope,  palpitations, abdominal pain, anorexia, nausea, vomiting, diarrhea  or change in bowel habits or change in bladder habits, change in stools or change in urine, dysuria, hematuria,  rash, arthralgias, visual complaints, headache, numbness, weakness or ataxia or problems with walking or coordination,  change in mood or  memory.           Past Medical History:  Diagnosis Date  . Bladder tumor   . CAD in native artery followed by pcp at the [facility] in [place] 11-13-2016 pt denies S&S   per cardiac cath 03-06-2014  by dr Martinique--  moderate non-obstructive cad  . Emphysema/COPD ([place])   . GERD (gastroesophageal reflux disease)   . Gout    per pt currently stable 11-13-2016  . History of kidney stones   . History of MRSA infection    right hand abscess 09/ 2016  . Hypertension   . Left inguinal hernia   . PAD (peripheral artery disease) ([place]) first dx 1994---  was followed by vascular dr Amedeo Plenty   s/p  fem-pop bypass graft 1994 and 2001/  pt is followed by the [facility] in [facility] 11-13-2016 denies s&s  . Rotator cuff syndrome of left shoulder   . Wears hearing aid in both ears     Outpatient Medications Prior to Visit  Medication Sig Dispense Refill  . acetaminophen (TYLENOL) 500 MG tablet Take 1,500 mg by mouth every 6 (six) hours as needed for  moderate pain.    Marland Kitchen allopurinol (ZYLOPRIM) 100 MG tablet Take 100 mg by mouth every morning.     Marland Kitchen amLODipine (NORVASC) 10 MG tablet Take 10 mg by mouth every morning.     Marland Kitchen aspirin EC 81 MG tablet Take 81 mg by mouth daily.    Marland Kitchen atorvastatin (LIPITOR) 80 MG tablet Take 40 mg by mouth daily.    . colchicine 0.6 MG tablet Take 1 tablet (0.6 mg total) by mouth daily. 90 tablet 3  . losartan (COZAAR) 50 MG tablet Take 1 tablet (50 mg total) by mouth daily. 90 tablet 3  . metoprolol tartrate (LOPRESSOR) 100 MG tablet Take 100 mg by mouth 2 (two) times daily.    . nitroGLYCERIN (NITROSTAT) 0.4 MG SL tablet Place 1 tablet (0.4 mg total) under the tongue every  5 (five) minutes as needed for chest pain. 25 tablet 11  . omeprazole (PRILOSEC) 20 MG capsule Take 1 capsule (20 mg total) by mouth daily as needed (acid reflux). 90 capsule 1   Facility-Administered Medications Prior to Visit  Medication Dose Route Frequency Provider Last Rate Last Dose  . epirubicin (ELLENCE) 50 mg in sodium chloride 0.9 % bladder instillation  50 mg Bladder Instillation Once [name], MD                 Objective:     BP (!) 144/76 (BP Location: Left Arm, Cuff Size: Normal)   Pulse (!) 56   Ht 5\' 10"  (1.778 m)   Wt 181 lb (82.1 kg)   SpO2 97%   BMI 25.97 kg/m   SpO2: 97 %  RA   Wt Readings from Last 3 Encounters:  11/12/17 181 lb (82.1 kg)  09/24/17 183 lb 9.6 oz (83.3 kg)  09/01/17 178 lb (80.7 kg)      Pleasant amb wm nad   HEENT: nl dentition, turbinates bilaterally, and oropharynx. Nl external ear canals without cough reflex   NECK :  without JVD/Nodes/TM/ nl carotid upstrokes bilaterally   LUNGS: no acc muscle use,  Nl contour chest which is clear to A and P bilaterally without cough on insp or exp maneuvers   CV:  RRR  no s3 or murmur or increase in P2, and no edema   ABD:  soft and nontender with nl inspiratory excursion in the supine position. No bruits or organomegaly appreciated, bowel sounds nl  MS:  Nl gait/ ext warm without deformities, calf tenderness, cyanosis or clubbing No obvious joint restrictions   SKIN: warm and dry without lesions    NEURO:  alert, approp, nl sensorium with  no motor or cerebellar deficits apparent.       CXR PA and Lateral:   11/12/2017 :    I personally reviewed images and agree with radiology impression as follows:    Bibasilar scarring. No edema or consolidation. Stable cardiac silhouette. Aortic atherosclerosis present.      Assessment   COPD GOLD II  PFT's  11/12/2017  FEV1  2.15 (65 % ) ratio 63   p 8 % improvement from saba p nohting prior to study with DLCO  60 % corrects to 77 %  for alv volume  And mild curvature  - 11/12/2017  Walked RA x 3 laps @ 185 ft each stopped due to  End of study, fast pace, no sob or desat    - trial off high dose lopressor and on bisoprolol 11/12/2017    When respiratory symptoms begin  well  after a patient reports complete smoking cessation,  Especially when this wasn't the case while they were smoking, a red flag is raised based on the work of Dr Kris Mouton which states:  if you quit smoking when your best day FEV1 is still well preserved it is highly unlikely you will progress to severe disease and with an FEV1 > 2 liters this is only mild airflow obstruction.   That is to say, once the smoking stops,  the symptoms should not suddenly erupt or markedly worsen.  If so, the differential diagnosis should include  obesity/deconditioning,  LPR/Reflux/Aspiration syndromes,  occult CHF, or  especially side effects of medications commonly used in this population such as ACEi and high dose Beta blockers      I suspect he has failed acei in past as he is on ARB though don't know that it caused "resp symptoms" but high dose lopressor certainly can cause airflow obstruction that responds poorly to saba (which is the case here) mimicking copd   Therefore first rec rx GERD/ diet/ change to bisprolol then return in 6 weeks to regroup   Essential hypertension Changed lopressor to bisoprolol 11/12/2017 due to ? Causing sob   In the setting of respiratory symptoms of unknown etiology,  It would be preferable to use bystolic, the most beta -1  selective Beta blocker available in sample form, with bisoprolol the most selective generic choice  on the market, at least on a trial basis, to make sure the spillover Beta 2 effects of the less specific Beta blockers are not contributing to this patient's symptoms.    Try one month bisoprolol 5 mg bid      Total time devoted to counseling  > 50 % of initial 60 min office visit:  review case with pt/wife discussion of  options/alternatives/ personally creating written customized instructions  in presence of pt  then going over those specific  Instructions directly with the pt including how to use all of the meds but in particular covering each new medication in detail and the difference between the maintenance= "automatic" meds and the prns using an action plan format for the latter (If this problem/symptom => do that organization reading Left to right).  Please see AVS from this visit for a full list of these instructions which I personally wrote for this pt and  are unique to this visit.      Christinia Gully, MD 11/12/2017

## 2017-11-12 NOTE — Patient Instructions (Addendum)
Omeprazole 20 mg Take 30-60 min before first meal of the day   Replace lopressor with bisoprolol 5 mg twice daily x one month trial    GERD (REFLUX)  is an extremely common cause of respiratory symptoms just like yours , many times with no obvious heartburn at all.    It can be treated with medication, but also with lifestyle changes including elevation of the head of your bed (ideally with 6 inch  bed blocks),  Smoking cessation, avoidance of late meals, excessive alcohol, and avoid fatty foods, chocolate, peppermint, colas, red wine, and acidic juices such as orange juice.  NO MINT OR MENTHOL PRODUCTS SO NO COUGH DROPS   USE SUGARLESS CANDY INSTEAD (Jolley ranchers or Stover's or Life Savers) or even ice chips will also do - the key is to swallow to prevent all throat clearing. NO OIL BASED VITAMINS - use powdered substitutes.  Please schedule a follow up office visit in 6 weeks, call sooner if needed

## 2017-11-12 NOTE — Assessment & Plan Note (Signed)
Changed lopressor to bisoprolol 11/12/2017 due to ? Causing sob   In the setting of respiratory symptoms of unknown etiology,  It would be preferable to use bystolic, the most beta -1  selective Beta blocker available in sample form, with bisoprolol the most selective generic choice  on the market, at least on a trial basis, to make sure the spillover Beta 2 effects of the less specific Beta blockers are not contributing to this patient's symptoms.    Try one month bisoprolol 5 mg bid      Total time devoted to counseling  > 50 % of initial 60 min office visit:  review case with pt/wife discussion of options/alternatives/ personally creating written customized instructions  in presence of pt  then going over those specific  Instructions directly with the pt including how to use all of the meds but in particular covering each new medication in detail and the difference between the maintenance= "automatic" meds and the prns using an action plan format for the latter (If this problem/symptom => do that organization reading Left to right).  Please see AVS from this visit for a full list of these instructions which I personally wrote for this pt and  are unique to this visit.

## 2017-11-12 NOTE — Telephone Encounter (Signed)
Notes recorded by Rosana Berger, CMA on 11/12/2017 at 2:20 PM EDT [facility] ------  Notes recorded by Tanda Rockers, MD on 11/12/2017 at 1:11 PM EDT Call pt: Reviewed cxr and no acute change so no change in recommendations made at ov   Pt aware of results. No further questions or concerns.

## 2017-11-12 NOTE — Assessment & Plan Note (Signed)
PFT's  11/12/2017  FEV1  2.15 (65 % ) ratio 63   p 8 % improvement from saba p nohting prior to study with DLCO  60 % corrects to 77 % for alv volume  And mild curvature  - 11/12/2017  Walked RA x 3 laps @ 185 ft each stopped due to  End of study, fast pace, no sob or desat    - trial off high dose lopressor and on bisoprolol 11/12/2017    When respiratory symptoms begin  well after a patient reports complete smoking cessation,  Especially when this wasn't the case while they were smoking, a red flag is raised based on the work of Dr Kris Mouton which states:  if you quit smoking when your best day FEV1 is still well preserved it is highly unlikely you will progress to severe disease and with an FEV1 > 2 liters this is only mild airflow obstruction.   That is to say, once the smoking stops,  the symptoms should not suddenly erupt or markedly worsen.  If so, the differential diagnosis should include  obesity/deconditioning,  LPR/Reflux/Aspiration syndromes,  occult CHF, or  especially side effects of medications commonly used in this population such as ACEi and high dose Beta blockers      I suspect he has failed acei in past as he is on ARB though don't know that it caused "resp symptoms" but high dose lopressor certainly can cause airflow obstruction that responds poorly to saba (which is the case here) mimicking copd   Therefore first rec rx GERD/ diet/ change to bisprolol then return in 6 weeks to regroup

## 2017-11-12 NOTE — Progress Notes (Signed)
LMTCB

## 2017-11-23 ENCOUNTER — Ambulatory Visit (INDEPENDENT_AMBULATORY_CARE_PROVIDER_SITE_OTHER): Payer: PPO | Admitting: Vascular Surgery

## 2017-11-23 ENCOUNTER — Ambulatory Visit (HOSPITAL_COMMUNITY)
Admission: RE | Admit: 2017-11-23 | Discharge: 2017-11-23 | Disposition: A | Discharge status: Home / Self Care | Payer: PPO | Source: Ambulatory Visit | Attending: Vascular Surgery | Admitting: Vascular Surgery

## 2017-11-23 ENCOUNTER — Encounter: Payer: Self-pay | Admitting: Vascular Surgery

## 2017-11-23 VITALS — BP 160/81 | HR 60 | Temp 97.6°F | Resp 16 | Ht 70.0 in | Wt 180.0 lb

## 2017-11-23 DIAGNOSIS — I739 Peripheral vascular disease, unspecified: Secondary | ICD-10-CM

## 2017-11-23 NOTE — Progress Notes (Signed)
[facility] [place]  Patient name: Manuel Barrett MRN: [id number] DOB: 1945/09/10 Sex: male  REASON FOR CONSULT: Follow-up peripheral vascular occlusive disease  HPI: Manuel Barrett is a 72 y.o. male, who is here today for follow-up.  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  Apparently he underwent femoral to femoral bypass initially in 1994 with Dr. Amedeo Plenty and subsequently underwent aortobifemoral bypass in 2001 and also with Dr. Amedeo Plenty.  He is done very well since that time.  He quit smoking and is been very active.  He is seen today since it is been quite a period of time since he is been evaluated.  He specifically denies any claudication symptoms and has no lower extremity tissue loss.  Stroke.  He is here today with his wife.  Past Medical History:  Diagnosis Date  . Bladder tumor   . CAD in native artery followed by pcp at the [facility] in [place] 11-13-2016 pt denies S&S   per cardiac cath 03-06-2014  by dr Martinique--  moderate non-obstructive cad  . Emphysema/COPD ([place])   . GERD (gastroesophageal reflux disease)   . Gout    per pt currently stable 11-13-2016  . History of kidney stones   . History of MRSA infection    right hand abscess 09/ 2016  . Hypertension   . Left inguinal hernia   . PAD (peripheral artery disease) ([place]) first dx 1994---  was followed by vascular dr Amedeo Plenty   s/p  fem-pop bypass graft 1994 and 2001/  pt is followed by the [facility] in [facility] 11-13-2016 denies s&s  . Rotator cuff syndrome of left shoulder   . Wears hearing aid in both ears     Family History  Problem Relation Age of Onset  . Heart failure Father   . COPD Father   . Dementia Mother     SOCIAL HISTORY: Social History   Socioeconomic History  . Marital status: Married    Spouse name: Manuel Barrett  . Number of children: 3  . Years of education: Not on file  . Highest education level: Not on file  Occupational  History  . Occupation: Geologist, engineering: [employer]  . 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 Financial resource strain: Not hard at all  . Food insecurity:    Worry: Never true    Inability: Never true  . Transportation needs:    Medical: No    Non-medical: No  Tobacco Use  . Smoking status: Former Smoker    Packs/day: 1.50    Years: 30.00    Pack years: 45.00    Types: Cigarettes    Last attempt to quit: 04/20/1992    Years since quitting: 25.6  . Smokeless tobacco: Never Used  Substance and Sexual Activity  . Alcohol use: Yes    Alcohol/week: 0.0 oz    Comment: occasional  . Drug use: No  . Sexual activity: Not on file  Lifestyle  . Physical activity:    Days per week: 3 days    Minutes per session: 30 min  . Stress: Not at all  Relationships  . Social connections:    Talks on phone: Three times a week    Gets together: Three times a week    Attends religious service: More than 4 times per year    Active member of  club or organization: No    Attends meetings of clubs or organizations: Never    Relationship status: Married  . Intimate partner violence:    Fear of current or ex partner: No    Emotionally abused: No    Physically abused: No    Forced sexual activity: No  Other Topics Concern  . Not on file  Social History Narrative   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    Allergies  Allergen Reactions  . Indomethacin Other (See Comments)    Nose bleeds    Current Outpatient Medications  Medication Sig Dispense Refill  . allopurinol (ZYLOPRIM) 100 MG tablet Take 100 mg by mouth every morning.     Marland Kitchen amLODipine (NORVASC) 10 MG tablet Take 10 mg by mouth every morning.     Marland Kitchen aspirin EC 81 MG tablet Take 81 mg by mouth daily.    Marland Kitchen atorvastatin (LIPITOR) 80 MG tablet Take 40 mg by mouth daily.    . bisoprolol (ZEBETA) 5 MG tablet Take 1 tablet (5 mg total) by  mouth daily. 60 tablet 2  . losartan (COZAAR) 50 MG tablet Take 1 tablet (50 mg total) by mouth daily. 90 tablet 3  . nitroGLYCERIN (NITROSTAT) 0.4 MG SL tablet Place 1 tablet (0.4 mg total) under the tongue every 5 (five) minutes as needed for chest pain. 25 tablet 11  . omeprazole (PRILOSEC) 20 MG capsule Take 1 capsule (20 mg total) by mouth daily as needed (acid reflux). 90 capsule 1  . acetaminophen (TYLENOL) 500 MG tablet Take 1,500 mg by mouth every 6 (six) hours as needed for moderate pain.    Marland Kitchen colchicine 0.6 MG tablet Take 1 tablet (0.6 mg total) by mouth daily. (Patient not taking: Reported on 11/23/2017) 90 tablet 3   No current facility-administered medications for this visit.    Facility-Administered Medications Ordered in Other Visits  Medication Dose Route Frequency Provider Last Rate Last Dose  . epirubicin (ELLENCE) 50 mg in sodium chloride 0.9 % bladder instillation  50 mg Bladder Instillation Once [name], MD        REVIEW OF SYSTEMS:  [X]  denotes positive finding, [ ]  denotes negative finding Cardiac  Comments:  Chest pain or chest pressure:    Shortness of breath upon exertion: x   Short of breath when lying flat:    Irregular heart rhythm:        Vascular    Pain in calf, thigh, or hip brought on by ambulation: x   Pain in feet at night that wakes you up from your sleep:     Blood clot in your veins:    Leg swelling:         Pulmonary    Oxygen at home:    Productive cough:     Wheezing:         Neurologic    Sudden weakness in arms or legs:     Sudden numbness in arms or legs:     Sudden onset of difficulty speaking or slurred speech:    Temporary loss of vision in one eye:     Problems with dizziness:         Gastrointestinal    Blood in stool:     Vomited blood:         Genitourinary    Burning when urinating:     Blood in urine:        Psychiatric    Major depression:  Hematologic    Bleeding problems:    Problems with blood  clotting too easily:        Skin    Rashes or ulcers:        Constitutional    Fever or chills:      PHYSICAL EXAM: Vitals:   11/23/17 1249 11/23/17 1250  BP: (!) 167/80 (!) 160/81  Pulse: 60   Resp: 16   Temp: 97.6 F (36.4 C)   SpO2: 97%   Weight: 180 lb (81.6 kg)   Height: 5\' 10"  (1.778 m)     GENERAL: The patient is a well-nourished male, in no acute distress. The vital signs are documented above. CARDIOVASCULAR: Carotid arteries without bruits bilaterally.  2+ radial, 2+ femoral and 2+ dorsalis pedis pulses bilaterally. PULMONARY: There is good air exchange  ABDOMEN: Soft and non-tender with no evidence of aneurysm or hernia  mUSCULOSKELETAL: There are no major deformities or cyanosis. NEUROLOGIC: No focal weakness or paresthesias are detected. SKIN: There are no ulcers or rashes noted. PSYCHIATRIC: The patient has a normal affect.  DATA:  Normal ankle arm index bilaterally and normal toe brachial index bilaterally  MEDICAL ISSUES: Stable long-term follow-up since aortobifemoral bypass in 2001.  Encouraged him to continue his walking program and and non-smoking status.  I would not recommend any formal vascular follow-up unless he develops new symptoms.   Rosetta Posner, MD FACS [facility] Tel [phone] Pager [phone]

## 2017-12-24 ENCOUNTER — Encounter: Payer: Self-pay | Admitting: Internal Medicine

## 2017-12-24 ENCOUNTER — Ambulatory Visit (INDEPENDENT_AMBULATORY_CARE_PROVIDER_SITE_OTHER): Payer: PPO | Admitting: Internal Medicine

## 2017-12-24 VITALS — BP 144/78 | HR 60 | Ht 68.0 in | Wt 181.6 lb

## 2017-12-24 DIAGNOSIS — I10 Essential (primary) hypertension: Secondary | ICD-10-CM

## 2017-12-24 DIAGNOSIS — J449 Chronic obstructive pulmonary disease, unspecified: Secondary | ICD-10-CM | POA: Diagnosis not present

## 2017-12-24 MED ORDER — BISOPROLOL FUMARATE 5 MG PO TABS: ORAL_TABLET | ORAL | 2 refills | Status: DC

## 2017-12-24 NOTE — Assessment & Plan Note (Signed)
PFT's  11/12/2017  FEV1  2.15 (65 % ) ratio 63   p 8 % improvement from saba p nohting prior to study with DLCO  60 % corrects to 77 % for alv volume  And mild curvature  - 11/12/2017  Walked RA x 3 laps @ 185 ft each stopped due to  End of study, fast pace, no sob or desat    - trial off high dose lopressor and on bisoprolol 11/12/2017 > improved 12/24/2017   As I explained to this patient in detail:  although there may be copd present, it may not be clinically relevant:   it does not appear to be limiting activity tolerance st.  That is to say:     I don't recommend aggressive pulmonary rx at this point unless limiting symptoms arise or acute exacerbations become as issue, neither of which is the case now.  I asked the patient to contact this office at any time in the future should either of these problems arise.    Keeping pt off less selective beta blockers may help  Pulmonary f/u is prn

## 2017-12-24 NOTE — Progress Notes (Signed)
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 Pulmonary Consult    Referred by Dr. [name]. Pt states DOE x 6 months- gets winded walking up an incline or carrying something heavy.   Dyspnea:  MMRC1 = can walk nl pace, flat grade, can't hurry or go uphills or steps s sob  / esp toting like case of drinks Cough:  occ / daytime Sleep: flat/ one pillow  Has occ cp but not with exertion/ eval by Martinique neg for active ischemia, nl ef 08/2017 rec Omeprazole 20 mg Take 30-60 min before first meal of the day  Replace lopressor with bisoprolol 5 mg twice daily x one month trial  GERD    12/24/2017  f/u ov/Wert re:  Copd GOLD II / now on bisoprilol 5 mg daily  Chief Complaint  Patient presents with  . Follow-up    Breathing is overall doing well. He does not use any inhalers.   Dyspnea:  Improved doe Cough: none Sleeping: ok flat one pillow SABA use: none  02: none     No obvious day to day or daytime variability or assoc excess/ purulent sputum or mucus plugs or hemoptysis or cp or chest tightness, subjective wheeze or overt sinus or hb symptoms.   Sleeping fine as above  without nocturnal  or early am exacerbation  of respiratory  c/o's or need for noct saba. Also denies any obvious fluctuation of symptoms with weather or environmental changes or other aggravating or alleviating factors except as outlined above   No unusual exposure hx or h/o childhood pna/ asthma or knowledge of premature birth.  Current Allergies, Complete Past Medical History, Past Surgical History, Family History, and Social History were reviewed in [facility] record.  ROS  The following are not active complaints unless bolded Hoarseness, sore throat, dysphagia, dental problems, itching, sneezing,  nasal congestion or discharge of excess mucus or purulent secretions, ear ache,   fever, chills, sweats, unintended wt loss or wt gain, classically pleuritic or exertional cp,  orthopnea pnd or arm/hand swelling  or leg swelling, presyncope, palpitations, abdominal pain, anorexia, nausea, vomiting, diarrhea  or change in bowel habits or change in bladder habits, change in stools or change in urine, dysuria, hematuria,  rash, arthralgias, visual complaints, headache, numbness, weakness or ataxia or problems with walking or coordination,  change in mood or  memory.        Current Meds  Medication Sig  . acetaminophen (TYLENOL) 500 MG tablet Take 1,500 mg by mouth every 6 (six) hours as needed for moderate pain.  Marland Kitchen allopurinol (ZYLOPRIM) 100 MG tablet Take 100 mg by mouth every morning.   Marland Kitchen amLODipine (NORVASC) 10 MG tablet Take 10 mg by mouth every morning.   Marland Kitchen aspirin EC 81 MG tablet Take 81 mg by mouth daily.  Marland Kitchen atorvastatin (LIPITOR) 80 MG tablet Take 40 mg by mouth daily.  . bisoprolol (ZEBETA) 5 MG tablet One daily  . colchicine 0.6 MG tablet Take 1 tablet (0.6 mg total) by mouth daily.  Marland Kitchen  losartan (COZAAR) 50 MG tablet Take 1 tablet (50 mg total) by mouth daily.  . nitroGLYCERIN (NITROSTAT) 0.4 MG SL tablet Place 1 tablet (0.4 mg total) under the tongue every 5 (five) minutes as needed for chest pain.  Marland Kitchen omeprazole (PRILOSEC) 20 MG capsule Take 1 capsule (20 mg total) by mouth daily as needed (acid reflux).  .                    Objective:        12/24/2017          181   11/12/17 181 lb (82.1 kg)  09/24/17 183 lb 9.6 oz (83.3 kg)  09/01/17 178 lb (80.7 kg)      Pleasant amb wm nad    Vital signs reviewed - Note on arrival 02 sats  96% on RA  And bp 144/78 and Pulse 60     HEENT: nl dentition / oropharynx. Nl  external ear canals without cough reflex -  Min  bilateral non-specific turbinate edema     NECK :  without JVD/Nodes/TM/ nl carotid upstrokes bilaterally   LUNGS: no acc muscle use,  Min barrel  contour chest wall with bilateral  Distant bs s audible wheeze and  without cough on insp or exp maneuver and min  Hyperresonant  to  percussion bilaterally     CV:  RRR  no s3 or murmur or increase in P2, and no edema   ABD:  soft and nontender with pos late  insp Hoover's  in the supine position. No bruits or organomegaly appreciated, bowel sounds nl  MS:   Nl gait/  ext warm without deformities, calf tenderness, cyanosis or clubbing No obvious joint restrictions   SKIN: warm and dry without lesions    NEURO:  alert, approp, nl sensorium with  no motor or cerebellar deficits apparent.                Assessment

## 2017-12-24 NOTE — Patient Instructions (Addendum)
You only have mild/ moderate copd and unless you are really limited from desired activities by your breathing or start having flares of cough/wheeze you don't need pulmonary medications or follow up   Ideally you should use a blood pressure medication that does not affect your breathing = bisoprolol 5 mg one daily     If you are satisfied with your treatment plan,  let your doctor know and he/she can either refill your medications or you can return here when your prescription runs out.     If in any way you are not 100% satisfied,  please tell us.  If 100% better, tell your friends!  Pulmonary follow up is as needed

## 2017-12-24 NOTE — Assessment & Plan Note (Addendum)
Changed lopressor to bisoprolol 11/12/2017 due to ? Causing sob > improved 12/24/2017    Although even in retrospect it may not be clear the lopressor ontributed to the pt's symptoms,  Pt improved off them and adding them back at this point or in the future would risk confusion in interpretation of non-specific respiratory symptoms to which this patient is prone  ie  Better not to muddy the waters here.    Follow up per Primary Care planned      I had an extended discussion with the patient reviewing all relevant studies completed to date and  lasting 15 to 20 minutes of a 25 minute visit    Each maintenance medication was reviewed in detail including most importantly the difference between maintenance and prns and under what circumstances the prns are to be triggered using an action plan format that is not reflected in the computer generated alphabetically organized AVS.     Please see AVS for specific instructions unique to this visit that I personally wrote and verbalized to the the pt in detail and then reviewed with pt  by my nurse highlighting any  changes in therapy recommended at today's visit to their plan of care.

## 2018-02-18 DIAGNOSIS — K4090 Unilateral inguinal hernia, without obstruction or gangrene, not specified as recurrent: Secondary | ICD-10-CM | POA: Diagnosis not present

## 2018-02-21 ENCOUNTER — Ambulatory Visit: Payer: Self-pay | Admitting: Surgery

## 2018-03-11 ENCOUNTER — Other Ambulatory Visit: Payer: Self-pay | Admitting: Internal Medicine

## 2018-03-11 MED ORDER — BISOPROLOL FUMARATE 5 MG PO TABS: ORAL_TABLET | ORAL | 0 refills | Status: DC

## 2018-05-02 NOTE — Progress Notes (Signed)
05/05/2018   PCP: [facility] in [place].  Chief Complaint  Patient presents with  . Follow-up  . Coronary Artery Disease  . Shortness of Breath    Primary Cardiologist: Dr. Peter Barrett   HPI:  73 y.o. Male seen for follow up CAD. 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 Admitted with chest pain on 03/05/14. No MI and cardiac cath with non obstructive disease.  He does have 50-60% in LAD.  He was started on Statins.  Repeat Myoview in May 2019 was normal. He complained of dyspnea on his last evaluation. PFTs c/w COPD. Now followed by Dr. Melvyn Novas. Metoprolol switched to bisoprolol..  He has been followed at the [facility]. He notes that his breathing has improved.  He has no significant  Claudication. Seen by Dr Donnetta Hutching in august and was doing well. No follow up needed.   Labs followed at the [facility] last done in September. Notes they were "normal" but I do not have a copy.     Allergies  Allergen Reactions  . Indomethacin Other (See Comments)    Nose bleeds    Current Outpatient Medications  Medication Sig Dispense Refill  . acetaminophen (TYLENOL) 500 MG tablet Take 1,500 mg by mouth every 6 (six) hours as needed for moderate pain.    Marland Kitchen allopurinol (ZYLOPRIM) 100 MG tablet Take 100 mg by mouth every morning.     Marland Kitchen amLODipine (NORVASC) 10 MG tablet Take 10 mg by mouth every morning.     Marland Kitchen aspirin EC 81 MG tablet Take 81 mg by mouth daily.    Marland Kitchen atorvastatin (LIPITOR) 80 MG tablet Take 40 mg by mouth daily.    . bisoprolol (ZEBETA) 5 MG tablet One daily 90 tablet 0  . colchicine 0.6 MG tablet Take 1 tablet (0.6 mg total) by mouth daily. 90 tablet 3  . nitroGLYCERIN (NITROSTAT) 0.4 MG SL tablet Place 1 tablet (0.4 mg total) under the tongue every 5 (five) minutes as needed for chest pain. 25 tablet 11  . omeprazole (PRILOSEC) 20 MG capsule Take 1 capsule (20 mg total) by mouth daily as needed (acid reflux). 90 capsule 1  . losartan (COZAAR)  100 MG tablet Take 1 tablet (100 mg total) by mouth daily. 90 tablet 3   No current facility-administered medications for this visit.    Facility-Administered Medications Ordered in Other Visits  Medication Dose Route Frequency Provider Last Rate Last Dose  . epirubicin (ELLENCE) 50 mg in sodium chloride 0.9 % bladder instillation  50 mg Bladder Instillation Once [name], MD        Past Medical History:  Diagnosis Date  . Bladder tumor   . CAD in native artery followed by pcp at the [facility] in [place] 11-13-2016 pt denies S&S   per cardiac cath 03-06-2014  by dr Barrett--  moderate non-obstructive cad  . Emphysema/COPD ([place])   . GERD (gastroesophageal reflux disease)   . Gout    per pt currently stable 11-13-2016  . History of kidney stones   . History of MRSA infection    right hand abscess 09/ 2016  . Hypertension   . Left inguinal hernia   . PAD (peripheral artery disease) ([place]) first dx 1994---  was followed by vascular dr Amedeo Plenty   s/p  fem-pop bypass graft 1994 and 2001/  pt is followed by the [facility] in [facility] 11-13-2016 denies s&s  .  Rotator cuff syndrome of left shoulder   . Wears hearing aid in both ears     Past Surgical History:  Procedure Laterality Date  . ANTERIOR CERVICAL DECOMP/DISCECTOMY FUSION  02/1999  . AORTA - BILATERAL FEMORAL ARTERY BYPASS GRAFT  2001  dr Amedeo Plenty  . APPENDECTOMY  1959  . CYSTOSCOPY WITH STENT PLACEMENT Left 11/26/2016   Procedure: CYSTOSCOPY WITH left double J STENT PLACEMENT;  Surgeon: [name], MD;  Location: [facility];  Service: Urology;  Laterality: Left;  . FEMORAL-POPLITEAL BYPASS GRAFT  1994   dr Amedeo Plenty  . I&D EXTREMITY Right 01/09/2015   Procedure: IRRIGATION AND DEBRIDEMENT RIGHT THUMB/HAND ;  Surgeon: Roseanne Kaufman, MD;  Location: [facility];  Service: Orthopedics;  Laterality: Right;  . LEFT HEART CATHETERIZATION WITH CORONARY ANGIOGRAM N/A 03/06/2014   Procedure: LEFT HEART CATHETERIZATION WITH CORONARY ANGIOGRAM;   Surgeon: Manuel M Martinique, MD;  Location: [facility] CATH LAB;  Service: Cardiovascular;  Laterality: N/A;  midLAD 50-60%; D1 20-30%; mCFX 30%; pRCA 30%;  ef 55-65%  . LUMBAR SPINE SURGERY  1980's  . TRANSURETHRAL RESECTION OF BLADDER TUMOR N/A 11/26/2016   Procedure: TRANSURETHRAL RESECTION OF BLADDER TUMOR (TURBT)/ left ureteroscopy;  Surgeon: [name], MD;  Location: [facility];  Service: Urology;  Laterality: N/A;    ROS:As noted in HPI. Otherwise ROS is negative.   Wt Readings from Last 3 Encounters:  05/05/18 188 lb (85.3 kg)  12/24/17 181 lb 9.6 oz (82.4 kg)  11/23/17 180 lb (81.6 kg)    PHYSICAL EXAM BP (!) 148/82   Pulse (!) 56   Ht 5\' 10"  (1.778 m)   Wt 188 lb (85.3 kg)   BMI 26.98 kg/m  GENERAL:  Well appearing WM in NAD HEENT:  PERRL, EOMI, sclera are clear. Oropharynx is clear. NECK:  No jugular venous distention, carotid upstroke brisk and symmetric, no bruits, no thyromegaly or adenopathy LUNGS:  Clear to auscultation bilaterally CHEST:  Unremarkable HEART:  RRR,  PMI not displaced or sustained,S1 and S2 within normal limits, no S3, no S4: no clicks, no rubs, no murmurs ABD:  Soft, nontender. BS +, no masses or bruits. No hepatomegaly, no splenomegaly EXT:  2 + pulses throughout, no edema, no cyanosis no clubbing SKIN:  Warm and dry.  No rashes NEURO:  Alert and oriented x 3. Cranial nerves II through XII intact. PSYCH:  Cognitively intact        Laboratory data:  Lab Results  Component Value Date   WBC 8.8 10/11/2016   HGB 15.3 11/26/2016   HCT 45.0 11/26/2016   PLT 192 10/11/2016   GLUCOSE 103 (H) 11/26/2016   CHOL 138 08/12/2015   TRIG 121 08/12/2015   HDL 30 (L) 08/12/2015   LDLCALC 84 08/12/2015   ALT 42 10/11/2016   AST 31 10/11/2016   NA 143 11/26/2016   K 4.0 11/26/2016   CL 105 11/26/2016   CREATININE 0.90 11/26/2016   BUN 17 11/26/2016   CO2 26 10/11/2016   TSH 1.040 03/05/2014   INR 1.15 03/05/2014   HGBA1C 6.4 (H)  03/05/2014    Lexiscan Myoview 09/01/17: Study Highlights    The left ventricular ejection fraction is normal (55-65%).  Nuclear stress EF: 55%.  There was no ST segment deviation noted during stress.  The study is normal.  This is a low risk study.    ASSESSMENT AND PLAN 1. 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 Last Myoveiw in 2019 was normal.  On good antianginal  therapy with high dose amlodipine and bisoprolol.   2. HTN still not at goal. Will increase losartan to 100 mg daily.  3. Hypercholesterolemia.  On high dose  lipitor. Labs followed at the [facility]. Asked patient to bring me a copy.  4. PAD s/p bifemoral BPG in 2001. Stable.  5. COPD improved. Followed by Dr. Melvyn Novas.   I will follow up in 6 months.

## 2018-05-05 ENCOUNTER — Encounter: Payer: Self-pay | Admitting: Cardiology

## 2018-05-05 ENCOUNTER — Ambulatory Visit (INDEPENDENT_AMBULATORY_CARE_PROVIDER_SITE_OTHER): Payer: PPO | Admitting: Cardiology

## 2018-05-05 VITALS — BP 148/82 | HR 56 | Ht 70.0 in | Wt 188.0 lb

## 2018-05-05 DIAGNOSIS — E7800 Pure hypercholesterolemia, unspecified: Secondary | ICD-10-CM | POA: Diagnosis not present

## 2018-05-05 DIAGNOSIS — I739 Peripheral vascular disease, unspecified: Secondary | ICD-10-CM

## 2018-05-05 DIAGNOSIS — I10 Essential (primary) hypertension: Secondary | ICD-10-CM | POA: Diagnosis not present

## 2018-05-05 DIAGNOSIS — I25118 Atherosclerotic heart disease of native coronary artery with other forms of angina pectoris: Secondary | ICD-10-CM | POA: Diagnosis not present

## 2018-05-05 MED ORDER — LOSARTAN POTASSIUM 100 MG PO TABS: 100.0000 mg | ORAL_TABLET | Freq: Every day | ORAL | 3 refills | Status: DC

## 2018-05-05 NOTE — Patient Instructions (Signed)
Increase losartan to 100 mg daily  Get me a copy of your lab work from the [facility]

## 2018-07-22 ENCOUNTER — Telehealth: Payer: Self-pay | Admitting: Internal Medicine

## 2018-07-22 MED ORDER — BISOPROLOL FUMARATE 5 MG PO TABS: ORAL_TABLET | ORAL | 0 refills | Status: DC

## 2018-07-22 NOTE — Telephone Encounter (Signed)
  Spoke with pt's wife and advised rx for Bisoprolol sent to pharmacy. Nothing further is needed.  .    Patient Instructions by Tanda Rockers, MD at 12/24/2017 8:45 AM  Author: Tanda Rockers, MD Author Type: Physician Filed: 12/24/2017 8:58 AM  Note Status: Addendum Cosign: Cosign Not Required Encounter Date: 12/24/2017  Editor: Tanda Rockers, MD (Physician)  Prior Versions: 1. Tanda Rockers, MD (Physician) at 12/24/2017 8:58 AM - Signed    You only have mild/ moderate copd and unless you are really limited from desired activities by your breathing or start having flares of cough/wheeze you don't need pulmonary medications or follow up   Ideally you should use a blood pressure medication that does not affect your breathing = bisoprolol 5 mg one daily     If you are satisfied with your treatment plan,  let your doctor know and he/she can either refill your medications or you can return here when your prescription runs out.     If in any way you are not 100% satisfied,  please tell us.  If 100% better, tell your friends!  Pulmonary follow up is as needed      Instructions   You only have mild/ moderate copd and unless you are really limited from desired activities by your breathing or start having flares of cough/wheeze you don't need pulmonary medications or follow up   Ideally you should use a blood pressure medication that does not affect your breathing = bisoprolol 5 mg one daily     If you are satisfied with your treatment plan,  let your doctor know and he/she can either refill your medications or you can return here when your prescription runs out.     If in any way you are not 100% satisfied,  please tell us.  If 100% better, tell your friends!  Pulmonary follow up is as needed

## 2018-08-22 DIAGNOSIS — L820 Inflamed seborrheic keratosis: Secondary | ICD-10-CM | POA: Diagnosis not present

## 2018-08-22 DIAGNOSIS — D225 Melanocytic nevi of trunk: Secondary | ICD-10-CM | POA: Diagnosis not present

## 2018-08-22 DIAGNOSIS — L280 Lichen simplex chronicus: Secondary | ICD-10-CM | POA: Diagnosis not present

## 2018-10-12 DIAGNOSIS — Z8551 Personal history of malignant neoplasm of bladder: Secondary | ICD-10-CM | POA: Diagnosis not present

## 2018-10-31 ENCOUNTER — Other Ambulatory Visit: Payer: Self-pay | Admitting: Internal Medicine

## 2018-11-02 ENCOUNTER — Telehealth: Payer: Self-pay | Admitting: Internal Medicine

## 2018-11-02 ENCOUNTER — Other Ambulatory Visit: Payer: Self-pay | Admitting: Internal Medicine

## 2018-11-02 MED ORDER — BISOPROLOL FUMARATE 5 MG PO TABS: ORAL_TABLET | ORAL | 0 refills | Status: DC

## 2018-11-02 NOTE — Telephone Encounter (Signed)
Called & spoke w/ pt's wife, Silva Bandy (on [place]). Silva Bandy states pt needs a refill of bisoprolol 5 mg daily, his maintenance blood pressure medication. According to pt's record, the last refill was 07/22/2018 with a 90-day supply. The note to the pharmacy by MW states "defer to PCP for refills". I let Silva Bandy know of this and she stated [facility]. I let Silva Bandy know I would route a message to MW for recommendations, which she verbalized understanding to.   Current Outpatient Medications on File Prior to Visit  Medication Sig Dispense Refill  . acetaminophen (TYLENOL) 500 MG tablet Take 1,500 mg by mouth every 6 (six) hours as needed for moderate pain.    Marland Kitchen allopurinol (ZYLOPRIM) 100 MG tablet Take 100 mg by mouth every morning.     Marland Kitchen amLODipine (NORVASC) 10 MG tablet Take 10 mg by mouth every morning.     Marland Kitchen aspirin EC 81 MG tablet Take 81 mg by mouth daily.    Marland Kitchen atorvastatin (LIPITOR) 80 MG tablet Take 40 mg by mouth daily.    . bisoprolol (ZEBETA) 5 MG tablet One daily 90 tablet 0  . colchicine 0.6 MG tablet Take 1 tablet (0.6 mg total) by mouth daily. 90 tablet 3  . losartan (COZAAR) 100 MG tablet Take 1 tablet (100 mg total) by mouth daily. 90 tablet 3  . nitroGLYCERIN (NITROSTAT) 0.4 MG SL tablet Place 1 tablet (0.4 mg total) under the tongue every 5 (five) minutes as needed for chest pain. 25 tablet 11  . omeprazole (PRILOSEC) 20 MG capsule Take 1 capsule (20 mg total) by mouth daily as needed (acid reflux). 90 capsule 1   Current Facility-Administered Medications on File Prior to Visit  Medication Dose Route Frequency Provider Last Rate Last Dose  . epirubicin (ELLENCE) 50 mg in sodium chloride 0.9 % bladder instillation  50 mg Bladder Instillation Once [name], MD       Allergies  Allergen Reactions  . Indomethacin Other (See Comments)    Nose bleeds     MW, please advise with your recommendations on the  refill of this medication. Thank you.

## 2018-11-02 NOTE — Telephone Encounter (Signed)
Pt is calling back [phone]

## 2018-11-02 NOTE — Telephone Encounter (Signed)
Attempted to call pt but unable to reach. Left message for pt to return call. 

## 2018-11-02 NOTE — Telephone Encounter (Signed)
Call returned to patient, made aware of MW recommendations. He requested that we send the 30 day supply and he will get with Dr. Martinique about future refills on Monday at his appt. Confirmed pharmacy. Refill sent. He also asks that we send a message to Dr. Martinique. I made this patient aware I would send this telephone encounter as FYI to his office. Voiced understanding. Nothing further is needed at this time.

## 2018-11-02 NOTE — Telephone Encounter (Signed)
It doesn't appear he has a f/u with me but needs a doc to refill it regularly = cardiology should be willing and they see him in 5 days and if needs some in meantime give another month

## 2018-11-03 NOTE — Progress Notes (Signed)
Virtual Visit via Video Note   This visit type was conducted due to national recommendations for restrictions regarding the COVID-19 Pandemic (e.g. social distancing) in an effort to limit this patient's exposure and mitigate transmission in our community.  Due to his co-morbid illnesses, this patient is at least at moderate risk for complications without adequate follow up.  This format is felt to be most appropriate for this patient at this time.  All issues noted in this document were discussed and addressed.  A limited physical exam was performed with this format.  Please refer to the patient's chart for his consent to telehealth for [facility].   Date:  11/07/2018   ID:  Manuel Barrett, DOB May 11, 1945, MRN [id number]  Patient Location: Home Provider Location: Home  PCP:  No primary care provider on file.  Cardiologist:  Donnamarie Shankles Martinique MD Electrophysiologist:  None   Evaluation Performed:  Follow-Up Visit  Chief Complaint:  Follow up CAD  History of Present Illness:    Manuel Barrett is a 73 y.o. 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 Admitted with chest pain on 03/05/14. No MI and cardiac cath with non obstructive disease.  He does have 50-60% in LAD.  He was started on Statins.  Repeat Myoview in May 2019 was normal. He complained of dyspnea on his last evaluation. PFTs c/w COPD. Now followed by Dr. Melvyn Novas. Metoprolol switched to bisoprolol.  He reports he is doing well. Only notes SOB and leg pain if he really exerts going up hill. No chest pain. BP has been difficult to control. Labs from September reviewed. He had recent labs at [facility] and will send me a copy.   The patient does not have symptoms concerning for COVID-19 infection (fever, chills, cough, or new shortness of breath).    Past Medical History:  Diagnosis Date  . Bladder tumor   . CAD in native artery followed by pcp at the [facility] [place] in [place] 11-13-2016 pt denies S&S   per  cardiac cath 03-06-2014  by dr Martinique--  moderate non-obstructive cad  . Emphysema/COPD ([place])   . GERD (gastroesophageal reflux disease)   . Gout    per pt currently stable 11-13-2016  . History of kidney stones   . History of MRSA infection    right hand abscess 09/ 2016  . Hypertension   . Left inguinal hernia   . PAD (peripheral artery disease) ([place]) first dx 1994---  was followed by vascular dr Amedeo Plenty   s/p  fem-pop bypass graft 1994 and 2001/  pt is followed by the [facility] 11-13-2016 denies s&s  . Rotator cuff syndrome of left shoulder   . Wears hearing aid in both ears    Past Surgical History:  Procedure Laterality Date  . ANTERIOR CERVICAL DECOMP/DISCECTOMY FUSION  02/1999  . AORTA - BILATERAL FEMORAL ARTERY BYPASS GRAFT  2001  dr Amedeo Plenty  . APPENDECTOMY  1959  . CYSTOSCOPY WITH STENT PLACEMENT Left 11/26/2016   Procedure: CYSTOSCOPY WITH left double J STENT PLACEMENT;  Surgeon: [name], MD;  Location[facility];  Service: Urology;  Laterality: Left;  . FEMORAL-POPLITEAL BYPASS GRAFT  1994   dr Amedeo Plenty  . I&D EXTREMITY Right 01/09/2015   Procedure: IRRIGATION AND DEBRIDEMENT RIGHT THUMB/HAND ;  Surgeon: Roseanne Kaufman, MD;  Location: [facility];  Service: Orthopedics;  Laterality: Right;  . LEFT HEART CATHETERIZATION WITH CORONARY ANGIOGRAM N/A 03/06/2014  Procedure: LEFT HEART CATHETERIZATION WITH CORONARY ANGIOGRAM;  Surgeon: Sharolyn Weber M Martinique, MD;  Location: [facility] CATH LAB;  Service: Cardiovascular;  Laterality: N/A;  midLAD 50-60%; D1 20-30%; mCFX 30%; pRCA 30%;  ef 55-65%  . LUMBAR SPINE SURGERY  1980's  . TRANSURETHRAL RESECTION OF BLADDER TUMOR N/A 11/26/2016   Procedure: TRANSURETHRAL RESECTION OF BLADDER TUMOR (TURBT)/ left ureteroscopy;  Surgeon: [name], MD;  Location: [facility];  Service: Urology;  Laterality: N/A;     Current Meds  Medication Sig  . acetaminophen (TYLENOL) 500 MG tablet Take 1,500 mg by mouth every 6 (six) hours  as needed for moderate pain.  Marland Kitchen allopurinol (ZYLOPRIM) 100 MG tablet Take 100 mg by mouth every morning.   Marland Kitchen amLODipine (NORVASC) 10 MG tablet Take 10 mg by mouth every morning.   Marland Kitchen atorvastatin (LIPITOR) 80 MG tablet Take 40 mg by mouth daily.  . bisoprolol (ZEBETA) 5 MG tablet One daily  . losartan (COZAAR) 100 MG tablet Take 1 tablet (100 mg total) by mouth daily.  . nitroGLYCERIN (NITROSTAT) 0.4 MG SL tablet Place 1 tablet (0.4 mg total) under the tongue every 5 (five) minutes as needed for chest pain.  Marland Kitchen omeprazole (PRILOSEC) 20 MG capsule Take 1 capsule (20 mg total) by mouth daily as needed (acid reflux).  . [DISCONTINUED] aspirin 325 MG tablet Take 325 mg by mouth daily.  . [DISCONTINUED] aspirin EC 81 MG tablet Take 81 mg by mouth daily.  . [DISCONTINUED] bisoprolol (ZEBETA) 5 MG tablet One daily     Allergies:   Indomethacin   Social History   Tobacco Use  . Smoking status: Former Smoker    Packs/day: 1.50    Years: 30.00    Pack years: 45.00    Types: Cigarettes    Quit date: 04/20/1992    Years since quitting: 26.5  . Smokeless tobacco: Never Used  Substance Use Topics  . Alcohol use: Yes    Alcohol/week: 0.0 standard drinks    Comment: occasional  . Drug use: No     Family Hx: The patient's family history includes COPD in his father; Dementia in his mother; Heart failure in his father.  ROS:   Please see the history of present illness.    All other systems reviewed and are negative.   Prior CV studies:   The following studies were reviewed today:  Lexiscan Myoview 09/01/17: Study Highlights    The left ventricular ejection fraction is normal (55-65%).  Nuclear stress EF: 55%.  There was no ST segment deviation noted during stress.  The study is normal.  This is a low risk study     Labs/Other Tests and Data Reviewed:    EKG:  No ECG reviewed.  Recent Labs: No results found for requested labs within last 8760 hours.   Recent Lipid Panel Lab  Results  Component Value Date/Time   CHOL 138 08/12/2015 08:52 AM   TRIG 121 08/12/2015 08:52 AM   HDL 30 (L) 08/12/2015 08:52 AM   CHOLHDL 4.6 08/12/2015 08:52 AM   CHOLHDL 5.4 05/08/2014 08:37 AM   LDLCALC 84 08/12/2015 08:52 AM   Labs dated 01/12/18: A1c 5.9. Cholesterol 142, triglycerides  Wt Readings from Last 3 Encounters:  11/07/18 181 lb (82.1 kg)  05/05/18 188 lb (85.3 kg)  12/24/17 181 lb 9.6 oz (82.4 kg)     Objective:    Vital Signs:  BP (!) 150/69   Pulse (!) 57   Ht 5\' 10"  (1.778 m)  Wt 181 lb (82.1 kg)   BMI 25.97 kg/m    VITAL SIGNS:  reviewed  General: no distress HEENT normal Respirations unlabored.  Skin clear Neuro: alert and oriented x 3. Nonfocal. Mood normal.   ASSESSMENT & PLAN:    1. 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 Last Myoveiw in 2019 was normal.  On good antianginal therapy with high dose amlodipine and bisoprolol.   2. HTN difficult to control. On amlodipine, losartan and bisoprolol at maximally tolerated doses. Encourage lifestyle modification and sodium restriction.  3. Hypercholesterolemia.  On high dose  lipitor. Labs followed at the [facility]. satisfactory 4. PAD s/p bifemoral BPG in 2001. Stable. released by Dr Early last year.  5. COPD improved. Followed by Dr. Melvyn Novas.   COVID-19 Education: The signs and symptoms of COVID-19 were discussed with the patient and how to seek care for testing (follow up with PCP or arrange E-visit).  The importance of social distancing was discussed today.  Time:   Today, I have spent 20 minutes with the patient with telehealth technology discussing the above problems.     Medication Adjustments/Labs and Tests Ordered: Current medicines are reviewed at length with the patient today.  Concerns regarding medicines are outlined above.   Tests Ordered: No orders of the defined types were placed in this encounter.   Medication Changes: Meds ordered this encounter  Medications  . bisoprolol (ZEBETA) 5 MG  tablet    Sig: One daily    Dispense:  90 tablet    Refill:  3    Defer to PCP for refills thanks  . aspirin EC 81 MG tablet    Sig: Take 1 tablet (81 mg total) by mouth daily.    Dispense:  90 tablet    Refill:  3    Follow Up:  In Person in 1 year(s)  Signed, Katilynn Sinkler Martinique, MD  11/07/2018 9:04 AM    [place]

## 2018-11-04 ENCOUNTER — Telehealth: Payer: Self-pay | Admitting: Cardiology

## 2018-11-04 NOTE — Telephone Encounter (Signed)
LVM, reminding pt of his appt on 11-07-18 and to call and give [place] consent for Virtual Visit.

## 2018-11-07 ENCOUNTER — Encounter: Payer: Self-pay | Admitting: Cardiology

## 2018-11-07 ENCOUNTER — Telehealth (INDEPENDENT_AMBULATORY_CARE_PROVIDER_SITE_OTHER): Payer: PPO | Admitting: Cardiology

## 2018-11-07 ENCOUNTER — Ambulatory Visit: Payer: PPO | Admitting: Cardiology

## 2018-11-07 VITALS — BP 150/69 | HR 57 | Ht 70.0 in | Wt 181.0 lb

## 2018-11-07 DIAGNOSIS — I739 Peripheral vascular disease, unspecified: Secondary | ICD-10-CM

## 2018-11-07 DIAGNOSIS — E7800 Pure hypercholesterolemia, unspecified: Secondary | ICD-10-CM

## 2018-11-07 DIAGNOSIS — I10 Essential (primary) hypertension: Secondary | ICD-10-CM

## 2018-11-07 DIAGNOSIS — I25118 Atherosclerotic heart disease of native coronary artery with other forms of angina pectoris: Secondary | ICD-10-CM | POA: Diagnosis not present

## 2018-11-07 MED ORDER — BISOPROLOL FUMARATE 5 MG PO TABS: ORAL_TABLET | ORAL | 3 refills | Status: DC

## 2018-11-07 MED ORDER — ASPIRIN EC 81 MG PO TBEC: 81.0000 mg | DELAYED_RELEASE_TABLET | Freq: Every day | ORAL | 3 refills | Status: AC

## 2018-11-07 NOTE — Patient Instructions (Addendum)
Reduce ASA to 81 mg daily  Continue other medication  Follow up in one year     Call 3 months before to schedule

## 2019-02-27 DIAGNOSIS — K4090 Unilateral inguinal hernia, without obstruction or gangrene, not specified as recurrent: Secondary | ICD-10-CM | POA: Diagnosis not present

## 2019-05-19 ENCOUNTER — Telehealth: Payer: Self-pay | Admitting: Cardiology

## 2019-05-19 DIAGNOSIS — I25118 Atherosclerotic heart disease of native coronary artery with other forms of angina pectoris: Secondary | ICD-10-CM

## 2019-05-19 DIAGNOSIS — H34211 Partial retinal artery occlusion, right eye: Secondary | ICD-10-CM

## 2019-05-19 NOTE — Telephone Encounter (Signed)
Spoke to patient's wife she stated husband had eyes checked at [facility] yesterday.Stated eye Dr.told him he had blockage.Advised Dr.Jordan is out of office today.I will speak to him on Mon 2/1 and let you know his recommendation.

## 2019-05-19 NOTE — Telephone Encounter (Signed)
Patient went to the [facility] yesterday to get his eyes checked, and the eye doctor mentioned that he had some blockage in the coronary arteries. He wanted to know what Dr. Martinique thought he should do.  If he does not answer, his wife Silva Bandy will be able to give more information

## 2019-05-23 NOTE — Telephone Encounter (Signed)
Wife of patient called back. She has not heard anything from Dr. Martinique and wanted to make sure she didn't miss a call from the office

## 2019-05-24 NOTE — Telephone Encounter (Signed)
Spoke to wife Dr.Jordan advised he will need to get eye Drs.note to find out what she saw.Stated she cannot remember her name.He saw her at [facility] in [place].Advised [facility] will call [facility] [place] and have her note [facility] to [place].He will be in office tomorrow.

## 2019-05-26 NOTE — Telephone Encounter (Signed)
Tried to call [facility] in [place] for the past 2 days at # [phone],unable to get through # rings a fast busy signal.Called the [facility] in [place] spoke to medical records was advised to fax them request at fax # [phone].Request for patient's eye exam in [place] appox 2 weeks ago requested.

## 2019-05-26 NOTE — Telephone Encounter (Signed)
Received eye exam from [facility].Dr.Jordan reviewed.He advised to schedule carotid dopplers.Carotid dopplers scheduled 05/29/19 at 11:00 am.

## 2019-05-29 ENCOUNTER — Other Ambulatory Visit: Payer: Self-pay

## 2019-05-29 ENCOUNTER — Ambulatory Visit (HOSPITAL_COMMUNITY)
Admission: RE | Admit: 2019-05-29 | Discharge: 2019-05-29 | Disposition: A | Discharge status: Home / Self Care | Payer: PPO | Source: Ambulatory Visit | Attending: Cardiology | Admitting: Cardiology

## 2019-05-29 DIAGNOSIS — I25118 Atherosclerotic heart disease of native coronary artery with other forms of angina pectoris: Secondary | ICD-10-CM | POA: Insufficient documentation

## 2019-05-29 DIAGNOSIS — H34211 Partial retinal artery occlusion, right eye: Secondary | ICD-10-CM | POA: Insufficient documentation

## 2019-08-28 ENCOUNTER — Other Ambulatory Visit: Payer: Self-pay | Admitting: Cardiology

## 2019-10-12 DIAGNOSIS — C670 Malignant neoplasm of trigone of bladder: Secondary | ICD-10-CM | POA: Diagnosis not present

## 2019-10-16 ENCOUNTER — Other Ambulatory Visit: Payer: Self-pay | Admitting: Urology

## 2019-10-16 MED ORDER — GEMCITABINE CHEMO FOR BLADDER INSTILLATION 2000 MG: 2000.0000 mg | Freq: Once | INTRAVENOUS | Status: DC

## 2019-10-17 NOTE — Progress Notes (Signed)
DUE TO COVID-19 ONLY ONE VISITOR IS ALLOWED TO COME WITH YOU AND STAY IN THE WAITING ROOM ONLY DURING PRE OP AND PROCEDURE DAY OF SURGERY. THE 1 VISITOR MAY VISIT WITH YOU AFTER SURGERY IN YOUR PRIVATE ROOM DURING VISITING HOURS ONLY!  YOU NEED TO HAVE A COVID 19 TEST ON_7/12/2019 ______ @_______ , THIS TEST MUST BE DONE BEFORE SURGERY, COME  [address], [place] , [id number].  ([place]) ONCE YOUR COVID TEST IS COMPLETED, PLEASE BEGIN THE QUARANTINE INSTRUCTIONS AS OUTLINED IN YOUR HANDOUT.                RIZWAN KUYPER  10/17/2019   Your procedure is scheduled on:  10/31/2019   Report to [facility] Main  Entrance   Report to admitting at  0530 AM     Call this number if you have problems the morning of surgery [phone]    Remember: Do not eat food or drink liquids :After Midnight. BRUSH YOUR TEETH MORNING OF SURGERY AND RINSE YOUR MOUTH OUT, NO CHEWING GUM CANDY OR MINTS.     Take these medicines the morning of surgery with A SIP OF WATER:  Allopurinol, Amlodipine, Bisoprolol, Prilosec if needed                                 You may not have any metal on your body including hair pins and              piercings  Do not wear jewelry, , lotions, powders or perfumes, deodorant             Do not wear nail polish on your fingernails.  Do not shave  48 hours prior to surgery.              Men may shave face and neck.   Do not bring valuables to the hospital. [place].  Contacts, dentures or bridgework may not be worn into surgery.  Leave suitcase in the car. After surgery it may be brought to your room.     Patients discharged the day of surgery will not be allowed to drive home. IF YOU ARE HAVING SURGERY AND GOING HOME THE SAME DAY, YOU MUST HAVE AN ADULT TO DRIVE YOU HOME AND BE WITH YOU FOR 24 HOURS. YOU MAY GO HOME BY TAXI OR UBER OR ORTHERWISE, BUT AN ADULT MUST ACCOMPANY YOU HOME AND STAY WITH  YOU FOR 24 HOURS.  Name and phone number of your driver:  Special Instructions: N/A              Please read over the following fact sheets you were given: _____________________________________________________________________             [facility] - Preparing for Surgery Before surgery, you can play an important role.  Because skin is not sterile, your skin needs to be as free of germs as possible.  You can reduce the number of germs on your skin by washing with CHG (chlorahexidine gluconate) soap before surgery.  CHG is an antiseptic cleaner which kills germs and bonds with the skin to continue killing germs even after washing. Please DO NOT use if you have an allergy to CHG or antibacterial soaps.  If your skin becomes reddened/irritated stop using the CHG and inform  your nurse when you arrive at Short Stay. Do not shave (including legs and underarms) for at least 48 hours prior to the first CHG shower.  You may shave your face/neck. Please follow these instructions carefully:  1.  Shower with CHG Soap the night before surgery and the  morning of Surgery.  2.  If you choose to wash your hair, wash your hair first as usual with your  normal  shampoo.  3.  After you shampoo, rinse your hair and body thoroughly to remove the  shampoo.                           4.  Use CHG as you would any other liquid soap.  You can apply chg directly  to the skin and wash                       Gently with a scrungie or clean washcloth.  5.  Apply the CHG Soap to your body ONLY FROM THE NECK DOWN.   Do not use on face/ open                           Wound or open sores. Avoid contact with eyes, ears mouth and genitals (private parts).                       Wash face,  Genitals (private parts) with your normal soap.             6.  Wash thoroughly, paying special attention to the area where your surgery  will be performed.  7.  Thoroughly rinse your body with warm water from the neck down.  8.  DO NOT  shower/wash with your normal soap after using and rinsing off  the CHG Soap.                9.  Pat yourself dry with a clean towel.            10.  Wear clean pajamas.            11.  Place clean sheets on your bed the night of your first shower and do not  sleep with pets. Day of Surgery : Do not apply any lotions/deodorants the morning of surgery.  Please wear clean clothes to the hospital/surgery center.  FAILURE TO FOLLOW THESE INSTRUCTIONS MAY RESULT IN THE CANCELLATION OF YOUR SURGERY PATIENT SIGNATURE_________________________________  NURSE SIGNATURE__________________________________  ________________________________________________________________________

## 2019-10-18 ENCOUNTER — Encounter (HOSPITAL_COMMUNITY): Payer: Self-pay

## 2019-10-18 ENCOUNTER — Encounter (HOSPITAL_COMMUNITY)
Admission: RE | Admit: 2019-10-18 | Discharge: 2019-10-18 | Disposition: A | Discharge status: Home / Self Care | Payer: PPO | Source: Ambulatory Visit | Attending: Urology | Admitting: Urology

## 2019-10-18 ENCOUNTER — Other Ambulatory Visit: Payer: Self-pay

## 2019-10-18 DIAGNOSIS — Z01818 Encounter for other preprocedural examination: Secondary | ICD-10-CM | POA: Diagnosis not present

## 2019-10-18 HISTORY — DX: Unspecified osteoarthritis, unspecified site: M19.90

## 2019-10-18 LAB — CBC
HCT: 45.2 % (ref 39.0–52.0)
Hemoglobin: 15.2 g/dL (ref 13.0–17.0)
MCH: 29.7 pg (ref 26.0–34.0)
MCHC: 33.6 g/dL (ref 30.0–36.0)
MCV: 88.3 fL (ref 80.0–100.0)
Platelets: 262 10*3/uL (ref 150–400)
RBC: 5.12 MIL/uL (ref 4.22–5.81)
RDW: 12.6 % (ref 11.5–15.5)
WBC: 6.2 10*3/uL (ref 4.0–10.5)
nRBC: 0 % (ref 0.0–0.2)

## 2019-10-18 LAB — BASIC METABOLIC PANEL
Anion gap: 7 (ref 5–15)
BUN: 19 mg/dL (ref 8–23)
CO2: 27 mmol/L (ref 22–32)
Calcium: 8.9 mg/dL (ref 8.9–10.3)
Chloride: 106 mmol/L (ref 98–111)
Creatinine, Ser: 1.52 mg/dL — ABNORMAL HIGH (ref 0.61–1.24)
GFR calc Af Amer: 53 mL/min — ABNORMAL LOW (ref >60)
GFR calc non Af Amer: 45 mL/min — ABNORMAL LOW (ref >60)
Glucose, Bld: 109 mg/dL — ABNORMAL HIGH (ref 70–99)
Potassium: 3.8 mmol/L (ref 3.5–5.1)
Sodium: 140 mmol/L (ref 135–145)

## 2019-10-18 LAB — SURGICAL PCR SCREEN
MRSA, PCR: NEGATIVE
Staphylococcus aureus: NEGATIVE

## 2019-10-18 NOTE — Progress Notes (Signed)
PCP - [facility] Cardiologist - Dr. Nolon Rod TV 11-07-18 epic [facility] PPM/ICD -  Device Orders -  Rep Notified -   Chest x-ray -  EKG - 10-18-19 epic Stress Test - 2019 ECHO -  Cardiac Cath -  Carotid 05-29-19   Sleep Study -  CPAP -   Fasting Blood Sugar -  Checks Blood Sugar _____ times a day  Blood Thinner Instructions: Aspirin Instructions:81 mg stop 5 days prior   ERAS Protcol - PRE-SURGERY Ensure or G2-   COVID TEST- 7-9   Anesthesia review: nonobstructive CAD, copd Gold 2, PAD  Patient denies shortness of breath, fever, cough and chest pain at PAT appointment   All instructions explained to the patient, with a verbal understanding of the material. Patient agrees to go over the instructions while at home for a better understanding. Patient also instructed to self quarantine after being tested for COVID-19. The opportunity to ask questions was provided.

## 2019-10-27 ENCOUNTER — Other Ambulatory Visit (HOSPITAL_COMMUNITY)
Admission: RE | Admit: 2019-10-27 | Discharge: 2019-10-27 | Disposition: A | Discharge status: Home / Self Care | Payer: PPO | Source: Ambulatory Visit | Attending: Urology | Admitting: Urology

## 2019-10-27 DIAGNOSIS — Z01812 Encounter for preprocedural laboratory examination: Secondary | ICD-10-CM | POA: Insufficient documentation

## 2019-10-27 DIAGNOSIS — Z20822 Contact with and (suspected) exposure to covid-19: Secondary | ICD-10-CM | POA: Insufficient documentation

## 2019-10-27 LAB — SARS CORONAVIRUS 2 (TAT 6-24 HRS): SARS Coronavirus 2: NEGATIVE

## 2019-10-30 ENCOUNTER — Encounter (HOSPITAL_COMMUNITY): Payer: Self-pay | Admitting: Urology

## 2019-10-30 NOTE — H&P (Signed)
I have bladder cancer that has been treated.  HPI: Manuel Barrett is a 74 year-old male established patient who is here for follow-up of bladder cancer treatment.  His bladder cancer was superficial and limitied to the bladder lining. His bladder cancer was not muscle invasive.   He did have a TURBT.   Mr. 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 He required a left stent and in 9/18 he had no hydro following stent removal. He had cystoscopy a year ago that was negative. He has had no flank pain, voiding complaints or hematuria. He is voiding without complaints.      AUA Symptom Score: He never has the sensation of not emptying his bladder completely after finishing urinating. He never has to urinate again less that two hours after he has finished urinating. He does not have to stop and start again several times when he urinates. He never finds it difficult to postpone urination. He never has a weak urinary stream. He never has to push or strain to begin urination. He never has to get up to urinate from the time he goes to bed until the time he gets up in the morning.   Calculated AUA Symptom Score: 0    ALLERGIES: indomethacin - epistaxis    MEDICATIONS: Allopurinol 100 mg tablet  Aspirin 325 mg tablet  Omeprazole 20 mg capsule,delayed release  Amlodipine Besylate 10 mg tablet  Atorvastatin Calcium 80 mg tablet  Bisoprolol Fumarate  Losartan Potassium     GU PSH: Cysto Remove Stent FB Sim - 2018 Cystoscopy - 10/12/2018, 10/25/2017, 2019, 2018 Cystoscopy Insert Stent, Left - 2018 Cystoscopy TURBT 2-5 cm - 2018       PSH Notes: femoral bypass 1994, 2000   NON-GU PSH: Appendectomy (open) Back surgery Shoulder Surgery (Unspecified), Left     GU PMH: History of bladder cancer, No recurrent tumors seen. F/U in 6 months for cystoscopy. 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 I am going to get him set up for a TURBT with possible left ureteral stent and possible Epirubicin instillation. I have reviewed the risks of bleeding, infection, ureteral or bladder injury, chemical cystitis, need for a stent or restaging, thrombotic events or anesthetic complications. , - 2018 Gross hematuria, He had hematuria about 2-3 months ago and is a former smoker. The base of the bladder is hazy on CT and I will have him return for cystoscopy. - 2018 Hydrocele, The hydrocele is small and not associated with tenderness. It doesn't need therapy. - 2018 LLQ pain, He has left LQ/groin pain that is medial to his prior surgical incision without significant bulge. It may be a strain from his recent fall but he will f/u with general surgery as planned. - 2018 Proteinuria, He has 3+ protein on the dip UA but Cr of 1.08 and no edema. I will get a 24 hour urine for protein and CrCl. - 2018 Renal calculus      PMH Notes: hydrocele, proteinuria   NON-GU PMH: Arthritis GERD Gout Hypercholesterolemia Hypertension    FAMILY HISTORY: Congestive Heart Failure - Father Prostate Cancer - Grandfather   SOCIAL HISTORY: Marital Status: Married Preferred Language: English; Race: White Current Smoking Status: Patient does not smoke anymore. Has not smoked since 10/18/1992. Smoked for 29 years.   Tobacco Use Assessment Completed: Used Tobacco in last  30 days? Has never drank.  Drinks 4+ caffeinated drinks per day. Patient's occupation Doctor, general practice.     Notes: 2 sons 1 daughter   REVIEW OF SYSTEMS:    GU Review Male:   Patient denies frequent urination, hard to postpone urination, burning/ pain with urination, get up at night to urinate, leakage of urine, stream starts and stops, trouble starting your stream, have to strain to urinate , erection problems, and penile pain.  Gastrointestinal (Upper):   Patient denies nausea, vomiting, and indigestion/ heartburn.  Gastrointestinal (Lower):    Patient denies diarrhea and constipation.  Constitutional:   Patient denies fever, night sweats, weight loss, and fatigue.  Skin:   Patient denies itching and skin rash/ lesion.  Eyes:   Patient denies blurred vision and double vision.  Ears/ Nose/ Throat:   Patient denies sore throat and sinus problems.  Hematologic/Lymphatic:   Patient denies swollen glands and easy bruising.  Cardiovascular:   Patient denies leg swelling and chest pains.  Respiratory:   Patient denies cough and shortness of breath.  Endocrine:   Patient denies excessive thirst.  Musculoskeletal:   Patient denies back pain and joint pain.  Neurological:   Patient denies headaches and dizziness.  Psychologic:   Patient denies depression and anxiety.   VITAL SIGNS:      10/12/2019 02:38 PM  Weight 180 lb / 81.65 kg  Height 70 in / 177.8 cm  BP 180/73 mmHg  Pulse 61 /min  Temperature 98.7 F / 37.0 C  BMI 25.8 kg/m   MULTI-SYSTEM PHYSICAL EXAMINATION:    Constitutional: Well-nourished. No physical deformities. Normally developed. Good grooming.  Respiratory: Normal breath sounds. No labored breathing, no use of accessory muscles.   Cardiovascular: Regular rate and rhythm. No murmur, no gallop.      PAST DATA REVIEW: None   PROCEDURES:         Flexible Cystoscopy - 52000  Risks, benefits, and some of the potential complications of the procedure were discussed. 34ml of 2% lidocaine jelly was instilled intraurethrally.  Cipro 500mg  given for antibiotic prophylaxis.     Meatus:  Normal size. Normal location. Normal condition.  Urethra:  No strictures.  External Sphincter:  Normal.  Verumontanum:  Normal.  Prostate:  Obstructing. Enlarged median lobe. Moderate hyperplasia.  Bladder Neck:  Non-obstructing.  Ureteral Orifices:  Normal location. Normal size. Normal shape. Effluxed clear urine.  Bladder:  Mild trabeculation. A right lateral wall tumor. 1/2 cm tumor. Normal mucosa. No stones.      The procedure  was well tolerated and there were no complications.         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 I will get him  set up for cystoscopy with RTG's, TURBT and instillation of gemcitabine. I have reviewed the risks in detail including bleeding, infection, injury to the bladder, ureters or urethral, chemical cystitis, thrombotic events and anesthetic complications.    PLAN:           Schedule Return Visit/Planned Activity: Next Available Appointment - Schedule Surgery  Procedure: Unspecified Date - Cystoscopy TURBT <2 cm - 34961 Notes: next avail

## 2019-10-30 NOTE — Anesthesia Preprocedure Evaluation (Addendum)
Anesthesia Evaluation  Patient identified by MRN, date of birth, ID band Patient awake    Reviewed: Allergy & Precautions, NPO status , Patient's Chart, lab work & pertinent test results  Airway Mallampati: II  TM Distance: >3 FB Neck ROM: Full    Dental no notable dental hx.    Pulmonary COPD, former smoker,    Pulmonary exam normal breath sounds clear to auscultation       Cardiovascular hypertension, Pt. on medications + CAD and + Peripheral Vascular Disease  Normal cardiovascular exam Rhythm:Regular Rate:Normal  ECG: SB, rate 53   Neuro/Psych negative neurological ROS  negative psych ROS   GI/Hepatic Neg liver ROS, GERD  Medicated and Controlled,  Endo/Other  negative endocrine ROS  Renal/GU negative Renal ROS     Musculoskeletal  (+) Arthritis , Gout   Abdominal   Peds  Hematology HLD   Anesthesia Other Findings BLADDER TUMOR  Reproductive/Obstetrics                           Anesthesia Physical Anesthesia Plan  ASA: III  Anesthesia Plan: General   Post-op Pain Management:    Induction: Intravenous  PONV Risk Score and Plan: 3 and Ondansetron, Dexamethasone, Midazolam and Treatment may vary due to age or medical condition  Airway Management Planned: Oral ETT  Additional Equipment:   Intra-op Plan:   Post-operative Plan: Extubation in OR  Informed Consent: I have reviewed the patients History and Physical, chart, labs and discussed the procedure including the risks, benefits and alternatives for the proposed anesthesia with the patient or authorized representative who has indicated his/her understanding and acceptance.     Dental advisory given  Plan Discussed with: CRNA  Anesthesia Plan Comments:        Anesthesia Quick Evaluation

## 2019-10-31 ENCOUNTER — Ambulatory Visit (HOSPITAL_COMMUNITY): Payer: PPO | Admitting: Certified Registered"

## 2019-10-31 ENCOUNTER — Ambulatory Visit (HOSPITAL_COMMUNITY): Payer: PPO

## 2019-10-31 ENCOUNTER — Ambulatory Visit (HOSPITAL_COMMUNITY)
Admission: RE | Admit: 2019-10-31 | Discharge: 2019-10-31 | Disposition: A | Discharge status: Home / Self Care | Payer: PPO | Attending: Urology | Admitting: Urology

## 2019-10-31 ENCOUNTER — Encounter (HOSPITAL_COMMUNITY)
Admission: RE | Disposition: A | Discharge status: Home / Self Care | Payer: Self-pay | Source: Home / Self Care | Attending: Urology

## 2019-10-31 ENCOUNTER — Other Ambulatory Visit: Payer: Self-pay

## 2019-10-31 ENCOUNTER — Encounter (HOSPITAL_COMMUNITY): Payer: Self-pay | Admitting: Urology

## 2019-10-31 DIAGNOSIS — E7800 Pure hypercholesterolemia, unspecified: Secondary | ICD-10-CM | POA: Insufficient documentation

## 2019-10-31 DIAGNOSIS — D494 Neoplasm of unspecified behavior of bladder: Secondary | ICD-10-CM | POA: Diagnosis not present

## 2019-10-31 DIAGNOSIS — M109 Gout, unspecified: Secondary | ICD-10-CM | POA: Diagnosis not present

## 2019-10-31 DIAGNOSIS — E785 Hyperlipidemia, unspecified: Secondary | ICD-10-CM | POA: Insufficient documentation

## 2019-10-31 DIAGNOSIS — Z888 Allergy status to other drugs, medicaments and biological substances status: Secondary | ICD-10-CM | POA: Diagnosis not present

## 2019-10-31 DIAGNOSIS — D090 Carcinoma in situ of bladder: Secondary | ICD-10-CM | POA: Diagnosis not present

## 2019-10-31 DIAGNOSIS — Z7982 Long term (current) use of aspirin: Secondary | ICD-10-CM | POA: Insufficient documentation

## 2019-10-31 DIAGNOSIS — M1990 Unspecified osteoarthritis, unspecified site: Secondary | ICD-10-CM | POA: Diagnosis not present

## 2019-10-31 DIAGNOSIS — I2510 Atherosclerotic heart disease of native coronary artery without angina pectoris: Secondary | ICD-10-CM | POA: Diagnosis not present

## 2019-10-31 DIAGNOSIS — C678 Malignant neoplasm of overlapping sites of bladder: Secondary | ICD-10-CM | POA: Insufficient documentation

## 2019-10-31 DIAGNOSIS — J449 Chronic obstructive pulmonary disease, unspecified: Secondary | ICD-10-CM | POA: Diagnosis not present

## 2019-10-31 DIAGNOSIS — Z87891 Personal history of nicotine dependence: Secondary | ICD-10-CM | POA: Insufficient documentation

## 2019-10-31 DIAGNOSIS — K219 Gastro-esophageal reflux disease without esophagitis: Secondary | ICD-10-CM | POA: Diagnosis not present

## 2019-10-31 DIAGNOSIS — I10 Essential (primary) hypertension: Secondary | ICD-10-CM | POA: Diagnosis not present

## 2019-10-31 DIAGNOSIS — Z79899 Other long term (current) drug therapy: Secondary | ICD-10-CM | POA: Insufficient documentation

## 2019-10-31 DIAGNOSIS — C679 Malignant neoplasm of bladder, unspecified: Secondary | ICD-10-CM | POA: Diagnosis present

## 2019-10-31 DIAGNOSIS — I739 Peripheral vascular disease, unspecified: Secondary | ICD-10-CM | POA: Insufficient documentation

## 2019-10-31 HISTORY — PX: TRANSURETHRAL RESECTION OF BLADDER TUMOR WITH MITOMYCIN-C: SHX6459

## 2019-10-31 HISTORY — PX: CYSTOSCOPY W/ RETROGRADES: SHX1426

## 2019-10-31 SURGERY — CYSTOSCOPY, WITH RETROGRADE PYELOGRAM
Anesthesia: General

## 2019-10-31 MED ORDER — ROCURONIUM BROMIDE 10 MG/ML (PF) SYRINGE
PREFILLED_SYRINGE | INTRAVENOUS | Status: DC | PRN
  Administered 2019-10-31: 40 mg via INTRAVENOUS

## 2019-10-31 MED ORDER — GEMCITABINE CHEMO FOR BLADDER INSTILLATION 2000 MG
2000.0000 mg | Freq: Once | INTRAVENOUS | Status: AC
  Administered 2019-10-31: 2000 mg via INTRAVESICAL
  Filled 2019-10-31: qty 2000

## 2019-10-31 MED ORDER — 0.9 % SODIUM CHLORIDE (POUR BTL) OPTIME
TOPICAL | Status: DC | PRN
  Administered 2019-10-31: 1000 mL

## 2019-10-31 MED ORDER — FENTANYL CITRATE (PF) 100 MCG/2ML IJ SOLN
INTRAMUSCULAR | Status: AC
  Filled 2019-10-31: qty 2

## 2019-10-31 MED ORDER — CEFAZOLIN SODIUM-DEXTROSE 2-4 GM/100ML-% IV SOLN
2.0000 g | INTRAVENOUS | Status: AC
  Administered 2019-10-31: 2 g via INTRAVENOUS
  Filled 2019-10-31: qty 100

## 2019-10-31 MED ORDER — ONDANSETRON HCL 4 MG/2ML IJ SOLN
INTRAMUSCULAR | Status: DC | PRN
  Administered 2019-10-31: 4 mg via INTRAVENOUS

## 2019-10-31 MED ORDER — SODIUM CHLORIDE 0.9 % IV SOLN
INTRAVENOUS | Status: DC | PRN
  Administered 2019-10-31: 18 mL

## 2019-10-31 MED ORDER — PHENYLEPHRINE HCL-NACL 10-0.9 MG/250ML-% IV SOLN
INTRAVENOUS | Status: DC | PRN
  Administered 2019-10-31: 25 ug/min via INTRAVENOUS

## 2019-10-31 MED ORDER — LIDOCAINE 2% (20 MG/ML) 5 ML SYRINGE
INTRAMUSCULAR | Status: DC | PRN
  Administered 2019-10-31: 60 mg via INTRAVENOUS

## 2019-10-31 MED ORDER — SUGAMMADEX SODIUM 200 MG/2ML IV SOLN
INTRAVENOUS | Status: DC | PRN
  Administered 2019-10-31: 250 mg via INTRAVENOUS

## 2019-10-31 MED ORDER — FENTANYL CITRATE (PF) 100 MCG/2ML IJ SOLN
INTRAMUSCULAR | Status: DC | PRN
  Administered 2019-10-31 (×2): 25 ug via INTRAVENOUS
  Administered 2019-10-31 (×2): 50 ug via INTRAVENOUS

## 2019-10-31 MED ORDER — PROPOFOL 10 MG/ML IV BOLUS
INTRAVENOUS | Status: AC
  Filled 2019-10-31: qty 20

## 2019-10-31 MED ORDER — CHLORHEXIDINE GLUCONATE 0.12 % MT SOLN
15.0000 mL | Freq: Once | OROMUCOSAL | Status: AC
  Administered 2019-10-31: 15 mL via OROMUCOSAL

## 2019-10-31 MED ORDER — ONDANSETRON HCL 4 MG/2ML IJ SOLN: 4.0000 mg | Freq: Once | INTRAMUSCULAR | Status: DC | PRN

## 2019-10-31 MED ORDER — DEXAMETHASONE SODIUM PHOSPHATE 10 MG/ML IJ SOLN
INTRAMUSCULAR | Status: DC | PRN
  Administered 2019-10-31: 10 mg via INTRAVENOUS

## 2019-10-31 MED ORDER — ORAL CARE MOUTH RINSE: 15.0000 mL | Freq: Once | OROMUCOSAL | Status: AC

## 2019-10-31 MED ORDER — PROPOFOL 10 MG/ML IV BOLUS
INTRAVENOUS | Status: DC | PRN
  Administered 2019-10-31: 20 mg via INTRAVENOUS
  Administered 2019-10-31: 150 mg via INTRAVENOUS

## 2019-10-31 MED ORDER — SODIUM CHLORIDE 0.9% FLUSH: 3.0000 mL | Freq: Two times a day (BID) | INTRAVENOUS | Status: DC

## 2019-10-31 MED ORDER — ACETAMINOPHEN 500 MG PO TABS
1000.0000 mg | ORAL_TABLET | Freq: Once | ORAL | Status: AC
  Administered 2019-10-31: 1000 mg via ORAL
  Filled 2019-10-31: qty 2

## 2019-10-31 MED ORDER — SODIUM CHLORIDE 0.9 % IR SOLN
Status: DC | PRN
  Administered 2019-10-31: 3000 mL via INTRAVESICAL

## 2019-10-31 MED ORDER — LACTATED RINGERS IV SOLN
INTRAVENOUS | Status: DC
  Administered 2019-10-31: 1000 mL via INTRAVENOUS

## 2019-10-31 MED ORDER — MIDAZOLAM HCL 2 MG/2ML IJ SOLN
INTRAMUSCULAR | Status: DC | PRN
  Administered 2019-10-31: 1 mg via INTRAVENOUS

## 2019-10-31 MED ORDER — MIDAZOLAM HCL 2 MG/2ML IJ SOLN
INTRAMUSCULAR | Status: AC
  Filled 2019-10-31: qty 2

## 2019-10-31 MED ORDER — FENTANYL CITRATE (PF) 100 MCG/2ML IJ SOLN: 25.0000 ug | INTRAMUSCULAR | Status: DC | PRN

## 2019-10-31 MED ORDER — TRAMADOL HCL 50 MG PO TABS: 50.0000 mg | ORAL_TABLET | Freq: Four times a day (QID) | ORAL | 0 refills | Status: DC | PRN

## 2019-10-31 SURGICAL SUPPLY — 25 items
BAG DRN RND TRDRP ANRFLXCHMBR (UROLOGICAL SUPPLIES) ×2
BAG URINE DRAIN 2000ML AR STRL (UROLOGICAL SUPPLIES) ×2 IMPLANT
BAG URO CATCHER STRL LF (MISCELLANEOUS) ×4 IMPLANT
CATH FOLEY 2WAY SLVR  5CC 20FR (CATHETERS) ×4
CATH FOLEY 2WAY SLVR 5CC 20FR (CATHETERS) IMPLANT
CATH FOLEY 3WAY 30CC 22FR (CATHETERS) IMPLANT
CATH URET 5FR 28IN OPEN ENDED (CATHETERS) IMPLANT
CLOTH BEACON ORANGE TIMEOUT ST (SAFETY) ×4 IMPLANT
COVER WAND RF STERILE (DRAPES) IMPLANT
GLOVE SURG SS PI 8.0 STRL IVOR (GLOVE) ×2 IMPLANT
GOWN STRL REUS W/TWL XL LVL3 (GOWN DISPOSABLE) ×4 IMPLANT
GUIDEWIRE STR DUAL SENSOR (WIRE) ×4 IMPLANT
HOLDER FOLEY CATH W/STRAP (MISCELLANEOUS) IMPLANT
KIT TURNOVER KIT A (KITS) IMPLANT
LOOP CUT BIPOLAR 24F LRG (ELECTROSURGICAL) ×2 IMPLANT
MANIFOLD NEPTUNE II (INSTRUMENTS) ×4 IMPLANT
PACK CYSTO (CUSTOM PROCEDURE TRAY) ×4 IMPLANT
PENCIL SMOKE EVACUATOR (MISCELLANEOUS) IMPLANT
SUT ETHILON 3 0 PS 1 (SUTURE) IMPLANT
SYR 30ML LL (SYRINGE) IMPLANT
SYR TOOMEY IRRIG 70ML (MISCELLANEOUS)
SYRINGE TOOMEY IRRIG 70ML (MISCELLANEOUS) IMPLANT
TUBING CONNECTING 10 (TUBING) ×3 IMPLANT
TUBING CONNECTING 10' (TUBING) ×1
TUBING UROLOGY SET (TUBING) ×4 IMPLANT

## 2019-10-31 NOTE — Anesthesia Procedure Notes (Signed)
Date/Time: 10/31/2019 8:23 AM Performed by: Cynda Familia, CRNA Oxygen Delivery Method: Simple face mask Placement Confirmation: positive ETCO2 Dental Injury: Teeth and Oropharynx as per pre-operative assessment

## 2019-10-31 NOTE — Discharge Instructions (Signed)
Transurethral Resection of Bladder Tumor, Care After This sheet gives you information about how to care for yourself after your procedure. Your health care provider may also give you more specific instructions. If you have problems or questions, contact your health care provider. What can I expect after the procedure? After the procedure, it is common to have:  A small amount of blood in your urine for up to 2 weeks.  Soreness or mild pain from your catheter. After your catheter is removed, you may have mild soreness, especially when urinating.  Pain in your lower abdomen. Follow these instructions at home: Medicines   Take over-the-counter and prescription medicines only as told by your health care provider.  If you were prescribed an antibiotic medicine, take it as told by your health care provider. Do not stop taking the antibiotic even if you start to feel better.  Do not drive for 24 hours if you were given a sedative during your procedure.  Ask your health care provider if the medicine prescribed to you: ? Requires you to avoid driving or using heavy machinery. ? Can cause constipation. You may need to take these actions to prevent or treat constipation:  Take over-the-counter or prescription medicines.  Eat foods that are high in fiber, such as beans, whole grains, and fresh fruits and vegetables.  Limit foods that are high in fat and processed sugars, such as fried or sweet foods. Activity  Return to your normal activities as told by your health care provider. Ask your health care provider what activities are safe for you.  Do not lift anything that is heavier than 10 lb (4.5 kg), or the limit that you are told, until your health care provider says that it is safe.  Avoid intense physical activity for as long as told by your health care provider.  Rest as told by your health care provider.  Avoid sitting for a long time without moving. Get up to take short walks every  1-2 hours. This is important to improve blood flow and breathing. Ask for help if you feel weak or unsteady. General instructions   Do not drink alcohol for as long as told by your health care provider. This is especially important if you are taking prescription pain medicines.  Do not take baths, swim, or use a hot tub until your health care provider approves. Ask your health care provider if you may take showers. You may only be allowed to take sponge baths.  If you have a catheter, follow instructions from your health care provider about caring for your catheter and your drainage bag.  Drink enough fluid to keep your urine pale yellow.  Wear compression stockings as told by your health care provider. These stockings help to prevent blood clots and reduce swelling in your legs.  Keep all follow-up visits as told by your health care provider. This is important. ? You will need to be followed closely with regular checks of your bladder and urethra (cystoscopies) to make sure that the cancer does not come back. Contact a health care provider if:  You have pain that gets worse or does not improve with medicine.  You have blood in your urine for more than 2 weeks.  You have cloudy or bad-smelling urine.  You become constipated. Signs of constipation may include having: ? Fewer than three bowel movements in a week. ? Difficulty having a bowel movement. ? Stools that are dry, hard, or larger than normal.  You have  a fever. Get help right away if:  You have: ? Severe pain. ? Bright red blood in your urine. ? Blood clots in your urine. ? A lot of blood in your urine.  Your catheter has been removed and you are not able to urinate.  You have a catheter in place and the catheter is not draining urine. Summary  After your procedure, it is common to have a small amount of blood in your urine, soreness or mild pain from your catheter, and pain in your lower abdomen.  Take  over-the-counter and prescription medicines only as told by your health care provider.  Rest as told by your health care provider. Follow your health care provider's instructions about returning to normal activities. Ask what activities are safe for you.  If you have a catheter, follow instructions from your health care provider about caring for your catheter and your drainage bag.  Get help right away if you cannot urinate, you have severe pain, or you have bright red blood or blood clots in your urine. This information is not intended to replace advice given to you by your health care provider. Make sure you discuss any questions you have with your health care provider. Document Revised: 11/04/2017 Document Reviewed: 11/04/2017 Elsevier Patient Education  [place].

## 2019-10-31 NOTE — Op Note (Signed)
Procedure: 1. Cystoscopy with bilateral retrograde pyelograms with interpretation. 2. Transurethral resection of 8 mm right lateral wall bladder tumor. 3. Transurethral resection of 5 mm dome bladder tumor. 4. Transurethral fulguration of 3 mm dome bladder tumor. 5. Cup biopsy and fulguration of left ureteral orifice lesion. 6. Instillation of gemcitabine in PACU.  Preop diagnosis. Right lateral wall bladder tumor.  Postop diagnosis: Same with small tumors on the dome and mucosal irregularity at the left ureteral orifice.  Surgeon: Dr. [name].  Anesthesia: General.  Specimen: 1. Cup biopsy from left ureteral orifice. 2. Bladder tumor chips from right lateral wall tumor. 3. Bladder tumor chips from dome tumor.  Drain: 90 [place] Foley catheter.  EBL: None.  Complications: None.  Indications:The patient is a 74 year old male with a history of low-grade bladder cancer resected in August 2018 at the left ureteral orifice he was found on recent surveillance cystoscopy to have a right lateral wall recurrence. He is to undergo cystoscopy with bilateral retrograde pyelography, TURBT and instillation of gemcitabine.  Procedure: He was given 2 g of Ancef. He was taken operating room where general anesthetic was induced. He was intubated and given a paralytic because of the location of his tumor. He was placed in lithotomy position and fitted with PAS hose. His perineum and genitalia were prepped with Betadine solution he was draped in usual sterile fashion.  Cystoscopy was performed using a 23 [place] scope and the 30 degree lens. Semination revealed a normal urethra. The external sphincter was intact. The prostatic urethra was approximately 3 cm in length with bilobar hyperplasia with coaptation and obstruction. There was only a small middle lobe. Examination of bladder revealed mild trabeculation. There was an approximately 8 mm tumor on the right lateral wall. There were also 2 tumors on the  dome measuring approximately 5 mm approximately 3 mm. All of them appeared papillary. The right ureteral orifice was unremarkable. The left ureteral orifice was somewhat laterally placed from prior resection and had a somewhat gaping appearance. There is very slight irregularity of the mucosa around the orifice but no obvious papillary fronds were noted. Inspection of the distal ureter with the cystoscope revealed no obvious tumor fronds within the most distal ureter.  Bilateral retrograde pyelography was performed with 5 French opening catheter and Omnipaque.  The left retrograde pyelogram revealed a normal ureter and intrarenal collecting system without filling defects or obstruction.  The right retrograde pyelogram revealed a normal ureter and intrarenal collecting system without filling defect or obstruction.  A cup biopsy forceps was then used to take a biopsy from the irregular mucosa adjacent to the left ureteral orifice.  The cystoscope was then replaced with a 26 French continuous-flow resectoscope sheath fitted with an Beatrix Fetters handle and a bipolar loop. Saline was used the irrigant. The tumor on the right lateral wall was in the muscle and the specimen was retrieved. The resection site was then fulgurated. The biopsy site at the left ureteral orifice was then fulgurated as well with care being taken to avoid over fulgurating adjacent to the orifice.  The small tumor on the dome was then generously fulgurated and the larger tumor on the dome was then resected and the base was fulgurated. The specimen was then retrieved. Once final inspection revealed complete hemostasis, no bladder wall injury no retained tissue, the scope was removed and a 20 French Foley catheter was inserted. The balloon was filled with 10 mL of sterile fluid. The catheter was placed to straight drainage. Patient  was taken down from the lithotomy position, his anesthetic was reversed and he was moved recovery in stable  condition.  In the recovery room his bladder was instilled with 2000 mg of gemcitabine which was left indwelling for 1 hour. The bladder was then drained and the catheter was removed. There were no complications.

## 2019-10-31 NOTE — Interval H&P Note (Signed)
History and Physical Interval Note:  Will need ET and paralytic for procedure with right lateral wall location.   10/31/2019 7:22 AM  Manuel Barrett  has presented today for surgery, with the diagnosis of BLADDER TUMOR.  The various methods of treatment have been discussed with the patient and family. After consideration of risks, benefits and other options for treatment, the patient has consented to  Procedure(s): CYSTOSCOPY WITH BILATERAL RETROGRADE PYELOGRAM (Bilateral) TRANSURETHRAL RESECTION OF BLADDER TUMOR WITH GEMCITABINE (N/A) as a surgical intervention.  The patient's history has been reviewed, patient examined, no change in status, stable for surgery.  I have reviewed the patient's chart and labs.  Questions were answered to the patient's satisfaction.     [name]

## 2019-10-31 NOTE — Transfer of Care (Signed)
Immediate Anesthesia Transfer of Care Note  Patient: Manuel Barrett  Procedure(s) Performed: CYSTOSCOPY WITH BILATERAL RETROGRADE PYELOGRAM (Bilateral ) TRANSURETHRAL RESECTION OF BLADDER TUMOR WITH GEMCITABINE (N/A )  Patient Location: PACU  Anesthesia Type:General  Level of Consciousness: sedated  Airway & Oxygen Therapy: Patient Spontanous Breathing and Patient connected to face mask oxygen  Post-op Assessment: Report given to RN and Post -op Vital signs reviewed and stable  Post vital signs: Reviewed and stable  Last Vitals:  Vitals Value Taken Time  BP 99/53 10/31/19 0830  Temp    Pulse 49 10/31/19 0831  Resp 8 10/31/19 0831  SpO2 100 % 10/31/19 0831  Vitals shown include unvalidated device data.  Last Pain:  Vitals:   10/31/19 0536  TempSrc: Oral      Patients Stated Pain Goal: 4 (81/84/03 7543)  Complications: No complications documented.

## 2019-10-31 NOTE — Anesthesia Procedure Notes (Signed)
Procedure Name: Intubation Date/Time: 10/31/2019 7:36 AM Performed by: Cynda Familia, CRNA Pre-anesthesia Checklist: Patient identified, Emergency Drugs available, Suction available and Patient being monitored Patient Re-evaluated:Patient Re-evaluated prior to induction Oxygen Delivery Method: Circle System Utilized Preoxygenation: Pre-oxygenation with 100% oxygen Induction Type: IV induction and Cricoid Pressure applied Ventilation: Mask ventilation without difficulty Laryngoscope Size: Miller and 2 Grade View: Grade I Tube type: Oral Tube size: 7.5 mm Number of attempts: 1 Airway Equipment and Method: Stylet Placement Confirmation: ETT inserted through vocal cords under direct vision,  positive ETCO2 and breath sounds checked- equal and bilateral Secured at: 22 cm Tube secured with: Tape Dental Injury: Teeth and Oropharynx as per pre-operative assessment  Comments: Smooth IV induction Ellender-- intubation AM CRNA atraumatic-- teeth and mouth as preop -- bilat BS Ellender

## 2019-10-31 NOTE — Anesthesia Postprocedure Evaluation (Signed)
Anesthesia Post Note  Patient: Marylou Flesher  Procedure(s) Performed: CYSTOSCOPY WITH BILATERAL RETROGRADE PYELOGRAM (Bilateral ) TRANSURETHRAL RESECTION OF BLADDER TUMOR WITH GEMCITABINE (N/A )     Patient location during evaluation: PACU Anesthesia Type: General Level of consciousness: awake Pain management: pain level controlled Vital Signs Assessment: post-procedure vital signs reviewed and stable Respiratory status: spontaneous breathing, nonlabored ventilation, respiratory function stable and patient connected to nasal cannula oxygen Cardiovascular status: blood pressure returned to baseline and stable Postop Assessment: no apparent nausea or vomiting Anesthetic complications: no   No complications documented.  Last Vitals:  Vitals:   10/31/19 0945 10/31/19 1000  BP: 130/64 130/67  Pulse: (!) 50 (!) 52  Resp: 11 13  Temp: (!) 36.4 C (!) 36.4 C  SpO2: 94% 92%    Last Pain:  Vitals:   10/31/19 1000  TempSrc:   PainSc: 0-No pain                 Walterine Amodei P Heston Widener

## 2019-11-01 ENCOUNTER — Encounter (HOSPITAL_COMMUNITY): Payer: Self-pay | Admitting: Urology

## 2019-11-01 LAB — SURGICAL PATHOLOGY

## 2019-11-09 NOTE — Progress Notes (Signed)
11/10/2019   PCP: [facility] in [place].  Chief Complaint  Patient presents with  . Hypertension  . Coronary Artery Disease    Primary Cardiologist: Dr. Ardys Hataway Martinique   HPI:  74 y.o. Male seen for follow up CAD. 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 Admitted with chest pain on 03/05/14. No MI and cardiac cath with non obstructive disease.  He does have 50-60% in LAD.  He was started on Statins.  Repeat Myoview in May 2019 was normal. He complained of dyspnea on his last evaluation. PFTs c/w COPD. Now followed by Dr. Melvyn Novas. Metoprolol switched to bisoprolol..  He has been followed at the [facility]. He gets most of his care here. Seen by Dr Donnetta Hutching in August 2019 and ABIs were normal. Carotid dopplers this year looked good. He does note some dyspnea if he walks up hill with some tightness. This has not changed. His BP has been persistently high. On last visit losartan was increased.   He recently underwent cystoscopy and removal of bladder tumors by Dr Jeffie Pollock on 10/31/19.   Allergies  Allergen Reactions  . Indomethacin Other (See Comments)    Nose bleeds    Current Outpatient Medications  Medication Sig Dispense Refill  . allopurinol (ZYLOPRIM) 100 MG tablet Take 100 mg by mouth every morning.     Marland Kitchen amLODipine (NORVASC) 10 MG tablet Take 10 mg by mouth every morning.     Marland Kitchen aspirin EC 81 MG tablet Take 1 tablet (81 mg total) by mouth daily. 90 tablet 3  . atorvastatin (LIPITOR) 80 MG tablet Take 40 mg by mouth daily.    . bisoprolol (ZEBETA) 5 MG tablet TAKE ONE (1) TABLET EACH DAY (Patient taking differently: Take 5 mg by mouth daily. TAKE ONE (1) TABLET EACH DAY) 90 tablet 1  . nitroGLYCERIN (NITROSTAT) 0.4 MG SL tablet Place 1 tablet (0.4 mg total) under the tongue every 5 (five) minutes as needed for chest pain. 25 tablet 11  . omeprazole (PRILOSEC) 20 MG capsule Take 1 capsule (20 mg total) by mouth daily as needed (acid reflux).  90 capsule 1  . losartan (COZAAR) 100 MG tablet Take 1 tablet (100 mg total) by mouth daily. 90 tablet 3   No current facility-administered medications for this visit.   Facility-Administered Medications Ordered in Other Visits  Medication Dose Route Frequency Provider Last Rate Last Admin  . epirubicin (ELLENCE) 50 mg in sodium chloride 0.9 % bladder instillation  50 mg Bladder Instillation Once [name], MD      . gemcitabine [facility]) chemo syringe for bladder instillation 2,000 mg  2,000 mg Bladder Instillation Once [name], MD        Past Medical History:  Diagnosis Date  . Arthritis   . Bladder tumor   . CAD in native artery followed by pcp at the [facility] in [place] 11-13-2016 pt denies S&S   per cardiac cath 03-06-2014  by dr Martinique--  moderate non-obstructive cad  . Emphysema/COPD ([place])   . GERD (gastroesophageal reflux disease)   . Gout    per pt currently stable 11-13-2016  . History of kidney stones   . History of MRSA infection    right hand abscess 09/ 2016  . Hypertension   . Left inguinal hernia   . PAD (peripheral artery disease) ([place]) first dx 1994---  was followed by vascular dr Amedeo Plenty   s/p  fem-pop bypass graft 1994 and 2001/  pt is followed by the [facility] in [facility] 11-13-2016 denies s&s  . Rotator cuff syndrome of left shoulder   . Wears hearing aid in both ears     Past Surgical History:  Procedure Laterality Date  . ANTERIOR CERVICAL DECOMP/DISCECTOMY FUSION  02/1999  . AORTA - BILATERAL FEMORAL ARTERY BYPASS GRAFT  2001  dr Amedeo Plenty  . APPENDECTOMY  1959  . CYSTOSCOPY W/ RETROGRADES Bilateral 10/31/2019   Procedure: CYSTOSCOPY WITH BILATERAL RETROGRADE PYELOGRAM;  Surgeon: [name], MD;  Location: [facility];  Service: Urology;  Laterality: Bilateral;  . CYSTOSCOPY WITH STENT PLACEMENT Left 11/26/2016   Procedure: CYSTOSCOPY WITH left double J STENT PLACEMENT;  Surgeon: [name], MD;  Location: [facility];  Service: Urology;   Laterality: Left;  . FEMORAL-POPLITEAL BYPASS GRAFT  1994   dr Amedeo Plenty  . I & D EXTREMITY Right 01/09/2015   Procedure: IRRIGATION AND DEBRIDEMENT RIGHT THUMB/HAND ;  Surgeon: Roseanne Kaufman, MD;  Location: [facility];  Service: Orthopedics;  Laterality: Right;  . LEFT HEART CATHETERIZATION WITH CORONARY ANGIOGRAM N/A 03/06/2014   Procedure: LEFT HEART CATHETERIZATION WITH CORONARY ANGIOGRAM;  Surgeon: Ryen Rhames M Martinique, MD;  Location: [facility] CATH LAB;  Service: Cardiovascular;  Laterality: N/A;  midLAD 50-60%; D1 20-30%; mCFX 30%; pRCA 30%;  ef 55-65%  . LUMBAR SPINE SURGERY  1980's  . TRANSURETHRAL RESECTION OF BLADDER TUMOR N/A 11/26/2016   Procedure: TRANSURETHRAL RESECTION OF BLADDER TUMOR (TURBT)/ left ureteroscopy;  Surgeon: [name], MD;  Location: [facility];  Service: Urology;  Laterality: N/A;  . TRANSURETHRAL RESECTION OF BLADDER TUMOR WITH MITOMYCIN-C N/A 10/31/2019   Procedure: TRANSURETHRAL RESECTION OF BLADDER TUMOR WITH GEMCITABINE;  Surgeon: [name], MD;  Location: [facility];  Service: Urology;  Laterality: N/A;    ROS:As noted in HPI. Otherwise ROS is negative.   Wt Readings from Last 3 Encounters:  11/10/19 177 lb (80.3 kg)  10/31/19 176 lb 6 oz (80 kg)  10/18/19 176 lb 6 oz (80 kg)    PHYSICAL EXAM BP (!) 170/70   Pulse 59   Ht 5\' 10"  (1.778 m)   Wt 177 lb (80.3 kg)   SpO2 99%   BMI 25.40 kg/m  GENERAL:  Well appearing WM in NAD HEENT:  PERRL, EOMI, sclera are clear. Oropharynx is clear. NECK:  No jugular venous distention, carotid upstroke brisk and symmetric, no bruits, no thyromegaly or adenopathy LUNGS:  Clear to auscultation bilaterally CHEST:  Unremarkable HEART:  RRR,  PMI not displaced or sustained,S1 and S2 within normal limits, no S3, no S4: no clicks, no rubs, no murmurs ABD:  Soft, nontender. BS +, no masses or bruits. No hepatomegaly, no splenomegaly EXT:  2 + pulses throughout, no edema, no cyanosis no clubbing SKIN:  Warm and dry.  No  rashes NEURO:  Alert and oriented x 3. Cranial nerves II through XII intact. PSYCH:  Cognitively intact        Laboratory data:  Lab Results  Component Value Date   WBC 6.2 10/18/2019   HGB 15.2 10/18/2019   HCT 45.2 10/18/2019   PLT 262 10/18/2019   GLUCOSE 109 (H) 10/18/2019   CHOL 138 08/12/2015   TRIG 121 08/12/2015   HDL 30 (L) 08/12/2015   LDLCALC 84 08/12/2015   ALT 42 10/11/2016   AST 31 10/11/2016   NA 140 10/18/2019   K 3.8 10/18/2019   CL 106 10/18/2019   CREATININE 1.52 (H) 10/18/2019  BUN 19 10/18/2019   CO2 27 10/18/2019   TSH 1.040 03/05/2014   INR 1.15 03/05/2014   HGBA1C 6.4 (H) 03/05/2014   Labs from the [facility] 08/08/19: cholesterol 140, triglycerides 173, HDL 34, LDL 71. PSA 1.02, A1c 5.8%. CBC normal. Creatinine 1.6 BUN 19. Otherwise CMET normal.   Lexiscan Myoview 09/01/17: Study Highlights    The left ventricular ejection fraction is normal (55-65%).  Nuclear stress EF: 55%.  There was no ST segment deviation noted during stress.  The study is normal.  This is a low risk study.    ASSESSMENT AND PLAN 1. 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 Last Myoveiw in 2019 was normal.  On good antianginal therapy with high dose amlodipine and bisoprolol.   2. HTN still not at goal. Really on max doses of bisoprolol, losartan and amlodipine. Will add hydralazine 25 mg tid. Not a good candidate for diuretics with CKD. Will have him follow up with pharmacy in one month.  3. Hypercholesterolemia.  On high dose  lipitor. LDL at goal 71.  4. PAD s/p bifemoral BPG in 2001. Stable. ABIs in 2019 normal. Good pedal pulses. 5. COPD improved. Followed by Dr. Melvyn Novas.   I will follow up in 6 months.

## 2019-11-10 ENCOUNTER — Encounter: Payer: Self-pay | Admitting: Cardiology

## 2019-11-10 ENCOUNTER — Ambulatory Visit: Payer: PPO | Admitting: Cardiology

## 2019-11-10 ENCOUNTER — Other Ambulatory Visit: Payer: Self-pay

## 2019-11-10 VITALS — BP 170/70 | HR 59 | Ht 70.0 in | Wt 177.0 lb

## 2019-11-10 DIAGNOSIS — I25118 Atherosclerotic heart disease of native coronary artery with other forms of angina pectoris: Secondary | ICD-10-CM

## 2019-11-10 DIAGNOSIS — R0600 Dyspnea, unspecified: Secondary | ICD-10-CM

## 2019-11-10 DIAGNOSIS — E7800 Pure hypercholesterolemia, unspecified: Secondary | ICD-10-CM

## 2019-11-10 DIAGNOSIS — R0609 Other forms of dyspnea: Secondary | ICD-10-CM

## 2019-11-10 DIAGNOSIS — I739 Peripheral vascular disease, unspecified: Secondary | ICD-10-CM

## 2019-11-10 DIAGNOSIS — I10 Essential (primary) hypertension: Secondary | ICD-10-CM

## 2019-11-10 MED ORDER — HYDRALAZINE HCL 25 MG PO TABS
25.0000 mg | ORAL_TABLET | Freq: Three times a day (TID) | ORAL | 6 refills | Status: DC
Start: 2019-11-10 — End: 2019-12-12

## 2019-11-10 NOTE — Patient Instructions (Signed)
We are going to add hydralazine 25 mg three times a day for added blood pressure control.

## 2019-11-13 DIAGNOSIS — C678 Malignant neoplasm of overlapping sites of bladder: Secondary | ICD-10-CM | POA: Diagnosis not present

## 2019-11-21 DIAGNOSIS — C678 Malignant neoplasm of overlapping sites of bladder: Secondary | ICD-10-CM | POA: Diagnosis not present

## 2019-11-21 DIAGNOSIS — Z5111 Encounter for antineoplastic chemotherapy: Secondary | ICD-10-CM | POA: Diagnosis not present

## 2019-11-28 DIAGNOSIS — Z5111 Encounter for antineoplastic chemotherapy: Secondary | ICD-10-CM | POA: Diagnosis not present

## 2019-11-28 DIAGNOSIS — C678 Malignant neoplasm of overlapping sites of bladder: Secondary | ICD-10-CM | POA: Diagnosis not present

## 2019-12-05 DIAGNOSIS — C678 Malignant neoplasm of overlapping sites of bladder: Secondary | ICD-10-CM | POA: Diagnosis not present

## 2019-12-05 DIAGNOSIS — Z5111 Encounter for antineoplastic chemotherapy: Secondary | ICD-10-CM | POA: Diagnosis not present

## 2019-12-12 ENCOUNTER — Telehealth: Payer: Self-pay | Admitting: Cardiology

## 2019-12-12 DIAGNOSIS — Z5111 Encounter for antineoplastic chemotherapy: Secondary | ICD-10-CM | POA: Diagnosis not present

## 2019-12-12 DIAGNOSIS — C678 Malignant neoplasm of overlapping sites of bladder: Secondary | ICD-10-CM | POA: Diagnosis not present

## 2019-12-12 MED ORDER — HYDRALAZINE HCL 25 MG PO TABS: 25.0000 mg | ORAL_TABLET | Freq: Three times a day (TID) | ORAL | 6 refills | Status: DC

## 2019-12-12 NOTE — Telephone Encounter (Signed)
Pt c/o medication issue:  1. Name of Medication: hydrALAZINE (APRESOLINE) 25 MG tablet  2. How are you currently taking this medication (dosage and times per day)? 1 tablet 3 times a day  3. Are you having a reaction (difficulty breathing--STAT)? no  4. What is your medication issue? Patient states he is out of the medication and his appointment is not until 12/19/19. He would like to know if he needs to have a refill before then.

## 2019-12-12 NOTE — Telephone Encounter (Signed)
Spoke to patient's wife she stated husband is out of Hydralazine and needs refill sent to pharmacy.Advised refill sent to pharmacy.

## 2019-12-18 NOTE — Progress Notes (Signed)
Patient ID: ARHUM PEEPLES                 DOB: June 29, 1945                      MRN: [id number]     HPI: Manuel Barrett is a 74 y.o. male referred by Dr. Martinique to HTN clinic. PMH includes CAD, hypertension, CKD, hyperlipidemia, PAD, and COPD. Gets most medication at [facility]).  Per Dr. Martinique, we will avoid diuretics for now d/t history of CKD with baseline Scr at 1.52. Patient I currently undergoing treatment for bladder cancer as well. Patient reports high BP readings every morning , but all evening readings between 120s and 130s. No home BP records available to review today.  Current HTN meds:  Amlodipine 10mg  daily - 7am Bisoprolol 5mg  daily - 7am Hydralazine 25mg  daily TID - 7am-3pm-11pm Losartan 100mg  daily - 7am  Intolerance: Indomethacin  BP goal: <130/80  Family History: The patient's family history includes COPD in his father; Dementia in his mother; Heart failure in his father.  Social History: former smoker, occasional alcohol  Diet: Balanced with limited salt  Exercise: activities of daily living including [place] BP readings: none provided  Wt Readings from Last 3 Encounters:  12/19/19 175 lb 6.4 oz (79.6 kg)  11/10/19 177 lb (80.3 kg)  10/31/19 176 lb 6 oz (80 kg)   BP Readings from Last 3 Encounters:  12/19/19 (!) 160/80  11/10/19 (!) 170/70  10/31/19 130/67   Pulse Readings from Last 3 Encounters:  12/19/19 (!) 55  11/10/19 59  10/31/19 (!) 52    Past Medical History:  Diagnosis Date  . Arthritis   . Bladder tumor   . CAD in native artery followed by pcp at the [facility] in [place] 11-13-2016 pt denies S&S   per cardiac cath 03-06-2014  by dr Martinique--  moderate non-obstructive cad  . Emphysema/COPD ([place])   . GERD (gastroesophageal reflux disease)   . Gout    per pt currently stable 11-13-2016  . History of kidney stones   . History of MRSA infection    right hand abscess 09/ 2016  . Hypertension   . Left  inguinal hernia   . PAD (peripheral artery disease) ([place]) first dx 1994---  was followed by vascular dr Amedeo Plenty   s/p  fem-pop bypass graft 1994 and 2001/  pt is followed by the [facility] in [facility] 11-13-2016 denies s&s  . Rotator cuff syndrome of left shoulder   . Wears hearing aid in both ears     Current Outpatient Medications on File Prior to Visit  Medication Sig Dispense Refill  . allopurinol (ZYLOPRIM) 100 MG tablet Take 100 mg by mouth every morning.     Marland Kitchen aspirin EC 81 MG tablet Take 1 tablet (81 mg total) by mouth daily. 90 tablet 3  . atorvastatin (LIPITOR) 80 MG tablet Take 40 mg by mouth daily.    . bisoprolol (ZEBETA) 5 MG tablet TAKE ONE (1) TABLET EACH DAY (Patient taking differently: Take 5 mg by mouth daily. TAKE ONE (1) TABLET EACH DAY) 90 tablet 1  . hydrALAZINE (APRESOLINE) 25 MG tablet Take 1 tablet (25 mg total) by mouth 3 (three) times daily. 90 tablet 6  . losartan (COZAAR) 100 MG tablet Take 1 tablet (100 mg total) by mouth daily. 90 tablet 3  . nitroGLYCERIN (NITROSTAT) 0.4 MG SL tablet Place 1 tablet (0.4 mg total)  under the tongue every 5 (five) minutes as needed for chest pain. 25 tablet 11  . omeprazole (PRILOSEC) 20 MG capsule Take 1 capsule (20 mg total) by mouth daily as needed (acid reflux). 90 capsule 1   Current Facility-Administered Medications on File Prior to Visit  Medication Dose Route Frequency Provider Last Rate Last Admin  . epirubicin (ELLENCE) 50 mg in sodium chloride 0.9 % bladder instillation  50 mg Bladder Instillation Once [name], MD      . gemcitabine [facility]) chemo syringe for bladder instillation 2,000 mg  2,000 mg Bladder Instillation Once [name], MD        Allergies  Allergen Reactions  . Indomethacin Other (See Comments)    Nose bleeds    Blood pressure (!) 160/80, pulse (!) 55, resp. rate 14, height 5\' 10"  (1.778 m), weight 175 lb 6.4 oz (79.6 kg), SpO2 97 %.  Essential hypertension Blood pressure remains above goal  during office visit , but patient reports BP around 120s and 130s every evening. He also reports taking all medication in the mornig and no medication in the evening or bedtime.  Will change amlodipine administration to 10mg  every evening, keep all other medication as previously prescribed, and follow up in 4 weeks. Patient was instructed to bring home BP records and home BP cuff.  Plan to increase hydralazine to 50mg  TID or change losartan to irbesartan 150mg  or  termisartan 40mg  if additional BP control is needed.   Jerolyn Flenniken Rodriguez-Guzman PharmD, BCPS, [place] [address] 12/22/2019 1:59 PM

## 2019-12-19 ENCOUNTER — Ambulatory Visit (INDEPENDENT_AMBULATORY_CARE_PROVIDER_SITE_OTHER): Payer: PPO | Admitting: Pharmacist

## 2019-12-19 ENCOUNTER — Other Ambulatory Visit: Payer: Self-pay

## 2019-12-19 VITALS — BP 160/80 | HR 55 | Resp 14 | Ht 70.0 in | Wt 175.4 lb

## 2019-12-19 DIAGNOSIS — C678 Malignant neoplasm of overlapping sites of bladder: Secondary | ICD-10-CM | POA: Diagnosis not present

## 2019-12-19 DIAGNOSIS — I10 Essential (primary) hypertension: Secondary | ICD-10-CM

## 2019-12-19 DIAGNOSIS — Z5111 Encounter for antineoplastic chemotherapy: Secondary | ICD-10-CM | POA: Diagnosis not present

## 2019-12-19 MED ORDER — AMLODIPINE BESYLATE 10 MG PO TABS: 10.0000 mg | ORAL_TABLET | Freq: Every day | ORAL | 1 refills | Status: AC

## 2019-12-19 NOTE — Patient Instructions (Addendum)
Return for a follow up appointment in 4 weeks  Check your blood pressure at home daily (if able) and keep record of the readings.  Take your BP meds as follows: *CHANGE amlodipine 10mg  to evening administration* *CONTINUE all other medication as prescribed*  Bring your BP cuff and your record of home blood pressures to your next appointment.  Exercise as you're able, try to walk approximately 30 minutes per day.  Keep salt intake to a minimum, especially watch canned and prepared boxed foods.  Eat more fresh fruits and vegetables and fewer canned items.  Avoid eating in fast food restaurants.    HOW TO TAKE YOUR BLOOD PRESSURE: . Rest 5 minutes before taking your blood pressure. .  Don't smoke or drink caffeinated beverages for at least 30 minutes before. . Take your blood pressure before (not after) you eat. . Sit comfortably with your back supported and both feet on the floor (don't cross your legs). . Elevate your arm to heart level on a table or a desk. . Use the proper sized cuff. It should fit smoothly and snugly around your bare upper arm. There should be enough room to slip a fingertip under the cuff. The bottom edge of the cuff should be 1 inch above the crease of the elbow. . Ideally, take 3 measurements at one sitting and record the average.

## 2019-12-22 ENCOUNTER — Encounter: Payer: Self-pay | Admitting: Pharmacist

## 2019-12-22 NOTE — Assessment & Plan Note (Signed)
Blood pressure remains above goal during office visit , but patient reports BP around 120s and 130s every evening. He also reports taking all medication in the mornig and no medication in the evening or bedtime.  Will change amlodipine administration to 10mg  every evening, keep all other medication as previously prescribed, and follow up in 4 weeks. Patient was instructed to bring home BP records and home BP cuff.  Plan to increase hydralazine to 50mg  TID or change losartan to irbesartan 150mg  or  termisartan 40mg  if additional BP control is needed.

## 2019-12-26 DIAGNOSIS — Z5111 Encounter for antineoplastic chemotherapy: Secondary | ICD-10-CM | POA: Diagnosis not present

## 2019-12-26 DIAGNOSIS — C678 Malignant neoplasm of overlapping sites of bladder: Secondary | ICD-10-CM | POA: Diagnosis not present

## 2020-01-16 ENCOUNTER — Other Ambulatory Visit: Payer: Self-pay

## 2020-01-16 ENCOUNTER — Ambulatory Visit (INDEPENDENT_AMBULATORY_CARE_PROVIDER_SITE_OTHER): Payer: PPO | Admitting: Pharmacist

## 2020-01-16 VITALS — BP 166/78 | HR 67 | Resp 15 | Ht 70.0 in | Wt 177.6 lb

## 2020-01-16 DIAGNOSIS — I10 Essential (primary) hypertension: Secondary | ICD-10-CM | POA: Diagnosis not present

## 2020-01-16 MED ORDER — HYDRALAZINE HCL 50 MG PO TABS: 50.0000 mg | ORAL_TABLET | Freq: Three times a day (TID) | ORAL | 1 refills | Status: DC

## 2020-01-16 NOTE — Progress Notes (Signed)
Patient ID: Manuel Barrett                 DOB: December 28, 1945                      MRN: [id number]     HPI: Manuel Barrett is a 74 y.o. male referred by Dr. Martinique to HTN clinic. PMH includes CAD, hypertension, CKD, hyperlipidemia, PAD, and COPD. Gets most medication at [facility] [place]).  Per Dr. Martinique, we will avoid diuretics for now d/t history of CKD with baseline Scr at 1.52. Patient recently finished therapy for bladder cancer.  During last OV , I changed his amlodipine 10mg  to evening administration and patient was instructed to bring 30 day home BP records for assessment.   He presents today for HTN flollow up. Denies swelling, headaches, shortness of breath, increased fatigue, blurry vision or chest pain. Reports compliance with all medication as prescribed.  Current HTN meds:  Amlodipine 10mg  daily - HS Bisoprolol 5mg  daily - 7am Hydralazine 25mg  daily TID - 7am-3pm-11pm Losartan 100mg  daily - 7am  Intolerance: Indomethacin  BP goal: <130/80  Family History: The patient's family history includes COPD in his father; Dementia in his mother; Heart failure in his father.  Social History: former smoker, occasional alcohol  Diet: Balanced with limited salt, 2 cups of coffee and lots of sodas; will work on decreasing amount of sodas per day  Exercise: activities of daily living including [place] BP readings:  22 readings, average 170/77; range 162-180/71-84  Wt Readings from Last 3 Encounters:  01/16/20 177 lb 9.6 oz (80.6 kg)  12/19/19 175 lb 6.4 oz (79.6 kg)  11/10/19 177 lb (80.3 kg)   BP Readings from Last 3 Encounters:  01/16/20 (!) 166/78  12/19/19 (!) 160/80  11/10/19 (!) 170/70   Pulse Readings from Last 3 Encounters:  01/16/20 67  12/19/19 (!) 55  11/10/19 59    Past Medical History:  Diagnosis Date  . Arthritis   . Bladder tumor   . CAD in native artery followed by pcp at the [facility] in [place] 11-13-2016 pt denies S&S   per  cardiac cath 03-06-2014  by dr Martinique--  moderate non-obstructive cad  . Emphysema/COPD ([place])   . GERD (gastroesophageal reflux disease)   . Gout    per pt currently stable 11-13-2016  . History of kidney stones   . History of MRSA infection    right hand abscess 09/ 2016  . Hypertension   . Left inguinal hernia   . PAD (peripheral artery disease) ([place]) first dx 1994---  was followed by vascular dr Amedeo Plenty   s/p  fem-pop bypass graft 1994 and 2001/  pt is followed by the [facility] in [facility] 11-13-2016 denies s&s  . Rotator cuff syndrome of left shoulder   . Wears hearing aid in both ears     Current Outpatient Medications on File Prior to Visit  Medication Sig Dispense Refill  . allopurinol (ZYLOPRIM) 100 MG tablet Take 100 mg by mouth every morning.     Marland Kitchen amLODipine (NORVASC) 10 MG tablet Take 1 tablet (10 mg total) by mouth at bedtime. 90 tablet 1  . aspirin EC 81 MG tablet Take 1 tablet (81 mg total) by mouth daily. 90 tablet 3  . atorvastatin (LIPITOR) 80 MG tablet Take 40 mg by mouth daily.    . bisoprolol (ZEBETA) 5 MG tablet TAKE ONE (1) TABLET EACH DAY (Patient taking differently:  Take 5 mg by mouth daily. TAKE ONE (1) TABLET EACH DAY) 90 tablet 1  . losartan (COZAAR) 100 MG tablet Take 1 tablet (100 mg total) by mouth daily. 90 tablet 3  . nitroGLYCERIN (NITROSTAT) 0.4 MG SL tablet Place 1 tablet (0.4 mg total) under the tongue every 5 (five) minutes as needed for chest pain. 25 tablet 11  . omeprazole (PRILOSEC) 20 MG capsule Take 1 capsule (20 mg total) by mouth daily as needed (acid reflux). 90 capsule 1   Current Facility-Administered Medications on File Prior to Visit  Medication Dose Route Frequency Provider Last Rate Last Admin  . epirubicin (ELLENCE) 50 mg in sodium chloride 0.9 % bladder instillation  50 mg Bladder Instillation Once [name], MD      . gemcitabine [facility]) chemo syringe for bladder instillation 2,000 mg  2,000 mg Bladder Instillation Once [name],  MD        Allergies  Allergen Reactions  . Indomethacin Other (See Comments)    Nose bleeds    Blood pressure (!) 166/78, pulse 67, resp. rate 15, height 5\' 10"  (1.778 m), weight 177 lb 9.6 oz (80.6 kg), SpO2 98 %.  Essential hypertension Average BP at home of 170/77 resemble BP readings during office visit of 166/78. Noted only 1 home BP reading under 150s. We will hold on using diuretics for now d/t CKD.   Will increase hydralazine to 50mg  three times daily and decrease amount of daily caffeine (patinet drink multiple regular sodas per day). Plan to follow up in 4 weeks for further assessment and medication titration.    Savita Runner Rodriguez-Guzman PharmD, BCPS, [place] [address] 01/23/2020 11:44 AM

## 2020-01-16 NOTE — Patient Instructions (Addendum)
Return for a  follow up appointment in 4-5 weeks  Check your blood pressure at home daily (if able) and keep record of the readings.  Take your BP meds as follows: INCREASE hydralazine to 50mg  three times daily (okay to take 2 tables of 25mg  until all gone)  *DECRASE amount of caffeine during day*  Bring all of your meds, your BP cuff and your record of home blood pressures to your next appointment.  Exercise as you're able, try to walk approximately 30 minutes per day.  Keep salt intake to a minimum, especially watch canned and prepared boxed foods.  Eat more fresh fruits and vegetables and fewer canned items.  Avoid eating in fast food restaurants.    HOW TO TAKE YOUR BLOOD PRESSURE: . Rest 5 minutes before taking your blood pressure. .  Don't smoke or drink caffeinated beverages for at least 30 minutes before. . Take your blood pressure before (not after) you eat. . Sit comfortably with your back supported and both feet on the floor (don't cross your legs). . Elevate your arm to heart level on a table or a desk. . Use the proper sized cuff. It should fit smoothly and snugly around your bare upper arm. There should be enough room to slip a fingertip under the cuff. The bottom edge of the cuff should be 1 inch above the crease of the elbow. . Ideally, take 3 measurements at one sitting and record the average.

## 2020-01-23 ENCOUNTER — Encounter: Payer: Self-pay | Admitting: Pharmacist

## 2020-01-23 NOTE — Assessment & Plan Note (Signed)
Average BP at home of 170/77 resemble BP readings during office visit of 166/78. Noted only 1 home BP reading under 150s. We will hold on using diuretics for now d/t CKD.   Will increase hydralazine to 50mg  three times daily and decrease amount of daily caffeine (patinet drink multiple regular sodas per day). Plan to follow up in 4 weeks for further assessment and medication titration.

## 2020-02-20 ENCOUNTER — Other Ambulatory Visit: Payer: Self-pay | Admitting: Cardiology

## 2020-02-22 ENCOUNTER — Other Ambulatory Visit: Payer: Self-pay

## 2020-02-22 ENCOUNTER — Ambulatory Visit (INDEPENDENT_AMBULATORY_CARE_PROVIDER_SITE_OTHER): Payer: PPO | Admitting: Pharmacist

## 2020-02-22 VITALS — BP 172/88 | HR 60 | Ht 70.0 in | Wt 180.6 lb

## 2020-02-22 DIAGNOSIS — I10 Essential (primary) hypertension: Secondary | ICD-10-CM

## 2020-02-22 LAB — COMPREHENSIVE METABOLIC PANEL
ALT: 16 IU/L (ref 0–44)
AST: 23 IU/L (ref 0–40)
Albumin/Globulin Ratio: 1.5 (ref 1.2–2.2)
Albumin: 4.1 g/dL (ref 3.7–4.7)
Alkaline Phosphatase: 132 IU/L — ABNORMAL HIGH (ref 44–121)
BUN/Creatinine Ratio: 9 — ABNORMAL LOW (ref 10–24)
BUN: 11 mg/dL (ref 8–27)
Bilirubin Total: 0.5 mg/dL (ref 0.0–1.2)
CO2: 24 mmol/L (ref 20–29)
Calcium: 9.6 mg/dL (ref 8.6–10.2)
Chloride: 103 mmol/L (ref 96–106)
Creatinine, Ser: 1.26 mg/dL (ref 0.76–1.27)
GFR calc Af Amer: 66 mL/min/{1.73_m2} (ref >59)
GFR calc non Af Amer: 57 mL/min/{1.73_m2} — ABNORMAL LOW (ref >59)
Globulin, Total: 2.8 g/dL (ref 1.5–4.5)
Glucose: 100 mg/dL — ABNORMAL HIGH (ref 65–99)
Potassium: 3.8 mmol/L (ref 3.5–5.2)
Sodium: 141 mmol/L (ref 134–144)
Total Protein: 6.9 g/dL (ref 6.0–8.5)

## 2020-02-22 NOTE — Progress Notes (Signed)
Patient ID: [name]                 DOB: 1945/11/12                      MRN: [id number]     HPI: Manuel Barrett is a 74 y.o. male referred by Dr. Martinique to HTN clinic. PMH is significant for  CAD, hypertension, CKD, hyperlipidemia, PAD, and COPD. Gets most medication at [facility]).  Per Dr. Martinique, we will avoid diuretics for now d/t history of CKD with baseline Scr at 1.52. Patient recently finished therapy for bladder cancer.  At last pharmacy visit hydralazine was increased to 50mg  TID.  Patient presents today in good spirits but forgot his blood pressure log.  Believes his home BP readings are in the 170s/70s and does not think increase in hydralazine has been effective.    Patient reports he drinks a cup of coffee every morning and has about 4 regular sodas with caffeine a day.  Adds salt to his food; uses it "until he can taste it."    Repeated blood pressure in room: 184/86  Has not had lab work drawn since June.  Need to check renal function.  Current HTN meds: amlodipine 10mg  daily, bisoprolol 5mg  daily, hydralazine 50mg  TID, losartan 100mg  daily Previously tried: metoprolol 50mg , metoprolol 100mg , maxzide 37.5/25mg   BP goal: <130/80  Home BP readings: Reports 170s/70s  Wt Readings from Last 3 Encounters:  01/16/20 177 lb 9.6 oz (80.6 kg)  12/19/19 175 lb 6.4 oz (79.6 kg)  11/10/19 177 lb (80.3 kg)   BP Readings from Last 3 Encounters:  01/16/20 (!) 166/78  12/19/19 (!) 160/80  11/10/19 (!) 170/70   Pulse Readings from Last 3 Encounters:  01/16/20 67  12/19/19 (!) 55  11/10/19 59    Renal function: CrCl cannot be calculated (Patient's most recent lab result is older than the maximum 21 days allowed.).  Past Medical History:  Diagnosis Date   Arthritis    Bladder tumor    CAD in native artery followed by pcp at the [facility] in [place] 11-13-2016 pt denies S&S   per cardiac cath 03-06-2014  by dr Martinique--  moderate non-obstructive cad    Emphysema/COPD ([place])    GERD (gastroesophageal reflux disease)    Gout    per pt currently stable 11-13-2016   History of kidney stones    History of MRSA infection    right hand abscess 09/ 2016   Hypertension    Left inguinal hernia    PAD (peripheral artery disease) ([place]) first dx 1994---  was followed by vascular dr Amedeo Plenty   s/p  fem-pop bypass graft 1994 and 2001/  pt is followed by the [facility] in [place] 11-13-2016 denies s&s   Rotator cuff syndrome of left shoulder    Wears hearing aid in both ears     Current Outpatient Medications on File Prior to Visit  Medication Sig Dispense Refill   bisoprolol (ZEBETA) 5 MG tablet TAKE ONE (1) TABLET EACH DAY 90 tablet 1   allopurinol (ZYLOPRIM) 100 MG tablet Take 100 mg by mouth every morning.      amLODipine (NORVASC) 10 MG tablet Take 1 tablet (10 mg total) by mouth at bedtime. 90 tablet 1   aspirin EC 81 MG tablet Take 1 tablet (81 mg total) by mouth daily. 90 tablet 3   atorvastatin (LIPITOR) 80 MG tablet Take 40 mg by mouth daily.  hydrALAZINE (APRESOLINE) 50 MG tablet Take 1 tablet (50 mg total) by mouth 3 (three) times daily. 90 tablet 1   losartan (COZAAR) 100 MG tablet Take 1 tablet (100 mg total) by mouth daily. 90 tablet 3   nitroGLYCERIN (NITROSTAT) 0.4 MG SL tablet Place 1 tablet (0.4 mg total) under the tongue every 5 (five) minutes as needed for chest pain. 25 tablet 11   omeprazole (PRILOSEC) 20 MG capsule Take 1 capsule (20 mg total) by mouth daily as needed (acid reflux). 90 capsule 1   Current Facility-Administered Medications on File Prior to Visit  Medication Dose Route Frequency Provider Last Rate Last Admin   epirubicin (ELLENCE) 50 mg in sodium chloride 0.9 % bladder instillation  50 mg Bladder Instillation Once [name], MD       gemcitabine [facility]) chemo syringe for bladder instillation 2,000 mg  2,000 mg Bladder Instillation Once [name], MD        Allergies  Allergen Reactions    Indomethacin Other (See Comments)    Nose bleeds     Assessment/Plan:  1. Hypertension - Patient BP in room today 172/88 (repeat 184/86) which is above goal of <130/80. Patient currently on max dose losartan and amlodipine and increase in hydralazine was ineffective.    Patient likely consuming more salt and caffeine then he is realizing.  Recommended patient switch to decaffeinated coffees and soft drinks and he was agreeable.  Cautioned about excessive salt use.   Will check lab work today and then consider switching losartan to a higher potency ARB such as olmesartan or valsartan providing renal function is stable.  Patient agreeable to plan.  Karren Cobble, PharmD, BCACP, [place] [address] [place], [place] [address] Phone: [phone]; Fax: [phone] 02/22/2020 4:26 PM

## 2020-02-22 NOTE — Patient Instructions (Addendum)
It was nice meeting you today!  We would like to have your blood pressure less than 130/80  Try to reduce the amount of caffeine you are drinking every day because this can increase your blood pressure.  Also try to reduce the amount of salt you are eating on a daily basis  We are going to draw lab work today to check how your kidneys are functioning and if everything remains the same, we will change the losartan to a medication that is a little stronger  Please call with any questions!  Karren Cobble, PharmD, BCACP, [place], [place], [place] [address] Phone: [phone]; Fax: [phone] 02/22/2020 9:52 AM

## 2020-02-23 ENCOUNTER — Telehealth: Payer: Self-pay | Admitting: Pharmacist

## 2020-02-23 MED ORDER — OLMESARTAN MEDOXOMIL 40 MG PO TABS: 40.0000 mg | ORAL_TABLET | Freq: Every day | ORAL | 1 refills | Status: DC

## 2020-02-23 NOTE — Telephone Encounter (Signed)
LMOM, patient to call back and discuss medication change.  Renal function improved and electrolytes within normal limits. Will stop losartan therapy, start olmesartan 40mg  daily

## 2020-02-27 NOTE — Telephone Encounter (Signed)
Renal function improved, attempted to call patient. No answer, left message on machine

## 2020-03-01 ENCOUNTER — Other Ambulatory Visit: Payer: Self-pay | Admitting: Cardiology

## 2020-03-01 NOTE — Telephone Encounter (Signed)
Rx has been sent to the pharmacy electronically. ° °

## 2020-03-13 DIAGNOSIS — R8279 Other abnormal findings on microbiological examination of urine: Secondary | ICD-10-CM | POA: Diagnosis not present

## 2020-03-13 DIAGNOSIS — R8271 Bacteriuria: Secondary | ICD-10-CM | POA: Diagnosis not present

## 2020-03-13 DIAGNOSIS — Z8551 Personal history of malignant neoplasm of bladder: Secondary | ICD-10-CM | POA: Diagnosis not present

## 2020-03-25 ENCOUNTER — Other Ambulatory Visit: Payer: Self-pay | Admitting: Cardiology

## 2020-05-17 NOTE — Progress Notes (Signed)
05/21/2020   PCP: [facility] in [place].  Chief Complaint  Patient presents with  . Hypertension  . Coronary Artery Disease    Primary Cardiologist: Dr. Haliyah Fryman Martinique   HPI:  75 y.o. Male seen for follow up CAD. 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 Admitted with chest pain on 03/05/14. No MI and cardiac cath with non obstructive disease.  He does have 50-60% in LAD.  He was started on Statins.  Repeat Myoview in May 2019 was normal. He complained of dyspnea on his last evaluation. PFTs c/w COPD. Now followed by Dr. Melvyn Novas. Metoprolol switched to bisoprolol..  He has been followed at the [facility]. He gets most of his care here. Seen by Dr Donnetta Hutching in August 2019 and ABIs were normal. Carotid dopplers this year looked good. He does note some dyspnea if he walks up hill with some tightness. This has not changed. His BP has been persistently high. On prior visit losartan was increased. He was started on hydralazine and dose increased to 50 mg tid. Losartan then switched to olmesartan. All without significant improvement. He does eat too much salt. He has eliminated caffeine.  He recently underwent cystoscopy and removal of bladder tumors by Dr Jeffie Pollock on 10/31/19.  Allergies  Allergen Reactions  . Indomethacin Other (See Comments)    Nose bleeds    Current Outpatient Medications  Medication Sig Dispense Refill  . allopurinol (ZYLOPRIM) 100 MG tablet Take 100 mg by mouth every morning.     Marland Kitchen amLODipine (NORVASC) 10 MG tablet Take 1 tablet (10 mg total) by mouth at bedtime. 90 tablet 1  . aspirin EC 81 MG tablet Take 1 tablet (81 mg total) by mouth daily. 90 tablet 3  . atorvastatin (LIPITOR) 80 MG tablet Take 40 mg by mouth daily.    . bisoprolol (ZEBETA) 5 MG tablet TAKE ONE (1) TABLET EACH DAY 90 tablet 1  . cloNIDine (CATAPRES) 0.1 MG tablet Take 1 tablet (0.1 mg total) by mouth 2 (two) times daily. 180 tablet 3  . hydrALAZINE  (APRESOLINE) 50 MG tablet TAKE ONE (1) TABLET THREE (3) TIMES EACH DAY 90 tablet 2  . nitroGLYCERIN (NITROSTAT) 0.4 MG SL tablet Place 1 tablet (0.4 mg total) under the tongue every 5 (five) minutes as needed for chest pain. 25 tablet 11  . olmesartan (BENICAR) 40 MG tablet TAKE ONE (1) TABLET EACH DAY 30 tablet 7  . omeprazole (PRILOSEC) 20 MG capsule Take 1 capsule (20 mg total) by mouth daily as needed (acid reflux). 90 capsule 1   No current facility-administered medications for this visit.    Past Medical History:  Diagnosis Date  . Arthritis   . Bladder tumor   . CAD in native artery followed by pcp at the [facility] in [place] 11-13-2016 pt denies S&S   per cardiac cath 03-06-2014  by dr Martinique--  moderate non-obstructive cad  . Emphysema/COPD ([place])   . GERD (gastroesophageal reflux disease)   . Gout    per pt currently stable 11-13-2016  . History of kidney stones   . History of MRSA infection    right hand abscess 09/ 2016  . Hypertension   . Left inguinal hernia   . PAD (peripheral artery disease) ([place]) first dx 1994---  was followed by vascular dr Amedeo Plenty   s/p  fem-pop bypass graft 1994 and 2001/  [facility] [place] 11-13-2016 denies s&s  . Rotator cuff syndrome of left shoulder   . Wears hearing aid in both ears     Past Surgical History:  Procedure Laterality Date  . ANTERIOR CERVICAL DECOMP/DISCECTOMY FUSION  02/1999  . AORTA - BILATERAL FEMORAL ARTERY BYPASS GRAFT  2001  dr Amedeo Plenty  . APPENDECTOMY  1959  . CYSTOSCOPY W/ RETROGRADES Bilateral 10/31/2019   Procedure: CYSTOSCOPY WITH BILATERAL RETROGRADE PYELOGRAM;  Surgeon: [name], MD;  Location: [facility];  Service: Urology;  Laterality: Bilateral;  . CYSTOSCOPY WITH STENT PLACEMENT Left 11/26/2016   Procedure: CYSTOSCOPY WITH left double J STENT PLACEMENT;  Surgeon: [name], MD;  Location: [facility];  Service: Urology;  Laterality: Left;  . FEMORAL-POPLITEAL BYPASS GRAFT  1994    dr Amedeo Plenty  . I & D EXTREMITY Right 01/09/2015   Procedure: IRRIGATION AND DEBRIDEMENT RIGHT THUMB/HAND ;  Surgeon: Roseanne Kaufman, MD;  Location: [facility];  Service: Orthopedics;  Laterality: Right;  . LEFT HEART CATHETERIZATION WITH CORONARY ANGIOGRAM N/A 03/06/2014   Procedure: LEFT HEART CATHETERIZATION WITH CORONARY ANGIOGRAM;  Surgeon: Markeith Jue M Martinique, MD;  Location[facility];  Service: Cardiovascular;  Laterality: N/A;  midLAD 50-60%; D1 20-30%; mCFX 30%; pRCA 30%;  ef 55-65%  . LUMBAR SPINE SURGERY  1980's  . TRANSURETHRAL RESECTION OF BLADDER TUMOR N/A 11/26/2016   Procedure: TRANSURETHRAL RESECTION OF BLADDER TUMOR (TURBT)/ left ureteroscopy;  Surgeon: [name], MD;  Location: [facility];  Service: Urology;  Laterality: N/A;  . TRANSURETHRAL RESECTION OF BLADDER TUMOR WITH MITOMYCIN-C N/A 10/31/2019   Procedure: TRANSURETHRAL RESECTION OF BLADDER TUMOR WITH GEMCITABINE;  Surgeon: [name], MD;  Location: [facility];  Service: Urology;  Laterality: N/A;    ROS:As noted in HPI. Otherwise ROS is negative.   Wt Readings from Last 3 Encounters:  05/21/20 186 lb 4 oz (84.5 kg)  02/22/20 180 lb 9.6 oz (81.9 kg)  01/16/20 177 lb 9.6 oz (80.6 kg)    PHYSICAL EXAM BP (!) 174/76   Pulse 62   Ht 5\' 10"  (1.778 m)   Wt 186 lb 4 oz (84.5 kg)   BMI 26.72 kg/m  GENERAL:  Well appearing WM in NAD HEENT:  PERRL, EOMI, sclera are clear. Oropharynx is clear. NECK:  No jugular venous distention, carotid upstroke brisk and symmetric, no bruits, no thyromegaly or adenopathy LUNGS:  Clear to auscultation bilaterally CHEST:  Unremarkable HEART:  RRR,  PMI not displaced or sustained,S1 and S2 within normal limits, no S3, no S4: no clicks, no rubs, no murmurs ABD:  Soft, nontender. BS +, no masses or bruits. No hepatomegaly, no splenomegaly EXT:  2 + pulses throughout, no edema, no cyanosis no clubbing SKIN:  Warm and dry.  No rashes NEURO:  Alert and oriented x 3. Cranial nerves II  through XII intact. PSYCH:  Cognitively intact   Laboratory data:  Lab Results  Component Value Date   WBC 6.2 10/18/2019   HGB 15.2 10/18/2019   HCT 45.2 10/18/2019   PLT 262 10/18/2019   GLUCOSE 100 (H) 02/22/2020   CHOL 138 08/12/2015   TRIG 121 08/12/2015   HDL 30 (L) 08/12/2015   LDLCALC 84 08/12/2015   ALT 16 02/22/2020   AST 23 02/22/2020   NA 141 02/22/2020   K 3.8 02/22/2020   CL 103 02/22/2020   CREATININE 1.26 02/22/2020   BUN 11 02/22/2020   CO2 24 02/22/2020   TSH 1.040 03/05/2014   INR  1.15 03/05/2014   HGBA1C 6.4 (H) 03/05/2014   Labs from the [facility] 08/08/19: cholesterol 140, triglycerides 173, HDL 34, LDL 71. PSA 1.02, A1c 5.8%. CBC normal. Creatinine 1.6 BUN 19. Otherwise CMET normal.   Lexiscan Myoview 09/01/17: Study Highlights    The left ventricular ejection fraction is normal (55-65%).  Nuclear stress EF: 55%.  There was no ST segment deviation noted during stress.  The study is normal.  This is a low risk study.    ASSESSMENT AND PLAN 1. 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 Last Myoveiw in 2019 was normal.  On good antianginal therapy with high dose amlodipine and bisoprolol. Asymptomatic.     2. HTN poorly controlled.  Really on max doses of bisoprolol, olmesartan, and amlodipine. No real response to hydralazine. Not a good candidate for diuretics with CKD. I have recommended adding clonidine 0.1 mg bid. Follow up in HTN clinic in one month. Will also check renal duplex given history of PAD  3. Hypercholesterolemia.  On high dose  lipitor. LDL at goal 71.   4. PAD s/p bifemoral BPG in 2001. Stable. ABIs in 2019 normal. Good pedal pulses.  5. COPD improved. Followed by Dr. Melvyn Novas.   I will follow up with me in 6 months.

## 2020-05-21 ENCOUNTER — Ambulatory Visit: Payer: PPO | Admitting: Cardiology

## 2020-05-21 ENCOUNTER — Other Ambulatory Visit: Payer: Self-pay

## 2020-05-21 ENCOUNTER — Encounter: Payer: Self-pay | Admitting: Cardiology

## 2020-05-21 VITALS — BP 174/76 | HR 62 | Ht 70.0 in | Wt 186.2 lb

## 2020-05-21 DIAGNOSIS — I25118 Atherosclerotic heart disease of native coronary artery with other forms of angina pectoris: Secondary | ICD-10-CM

## 2020-05-21 DIAGNOSIS — I10 Essential (primary) hypertension: Secondary | ICD-10-CM

## 2020-05-21 DIAGNOSIS — E7800 Pure hypercholesterolemia, unspecified: Secondary | ICD-10-CM | POA: Diagnosis not present

## 2020-05-21 DIAGNOSIS — I739 Peripheral vascular disease, unspecified: Secondary | ICD-10-CM

## 2020-05-21 MED ORDER — CLONIDINE HCL 0.1 MG PO TABS
0.1000 mg | ORAL_TABLET | Freq: Two times a day (BID) | ORAL | 3 refills | Status: DC
Start: 2020-05-21 — End: 2020-06-18

## 2020-05-21 NOTE — Patient Instructions (Addendum)
Add clonidine 0.1 mg twice a day for BP  We will schedule a renal duplex to check the circulation to the kidneys.  Schedule appointment with Hypertension Clinic in 4 weeks  Follow up appointment with Dr.Eliu Batch in 6 months   Call in April to schedule August appointment

## 2020-05-31 ENCOUNTER — Inpatient Hospital Stay (HOSPITAL_COMMUNITY): Admission: RE | Admit: 2020-05-31 | Payer: PPO | Source: Ambulatory Visit

## 2020-06-01 ENCOUNTER — Other Ambulatory Visit: Payer: Self-pay | Admitting: Cardiology

## 2020-06-10 ENCOUNTER — Other Ambulatory Visit: Payer: Self-pay

## 2020-06-10 ENCOUNTER — Ambulatory Visit (HOSPITAL_COMMUNITY)
Admission: RE | Admit: 2020-06-10 | Discharge: 2020-06-10 | Disposition: A | Discharge status: Home / Self Care | Payer: PPO | Source: Ambulatory Visit | Attending: Cardiovascular Disease | Admitting: Cardiovascular Disease

## 2020-06-10 DIAGNOSIS — I25118 Atherosclerotic heart disease of native coronary artery with other forms of angina pectoris: Secondary | ICD-10-CM | POA: Diagnosis not present

## 2020-06-10 DIAGNOSIS — E7800 Pure hypercholesterolemia, unspecified: Secondary | ICD-10-CM | POA: Insufficient documentation

## 2020-06-10 DIAGNOSIS — I10 Essential (primary) hypertension: Secondary | ICD-10-CM | POA: Insufficient documentation

## 2020-06-10 DIAGNOSIS — I739 Peripheral vascular disease, unspecified: Secondary | ICD-10-CM | POA: Diagnosis not present

## 2020-06-12 DIAGNOSIS — Z8551 Personal history of malignant neoplasm of bladder: Secondary | ICD-10-CM | POA: Diagnosis not present

## 2020-06-18 ENCOUNTER — Ambulatory Visit (INDEPENDENT_AMBULATORY_CARE_PROVIDER_SITE_OTHER): Payer: PPO | Admitting: Pharmacist

## 2020-06-18 ENCOUNTER — Other Ambulatory Visit: Payer: Self-pay

## 2020-06-18 VITALS — BP 154/70 | HR 65 | Resp 15 | Ht 70.0 in | Wt 189.8 lb

## 2020-06-18 DIAGNOSIS — I10 Essential (primary) hypertension: Secondary | ICD-10-CM

## 2020-06-18 MED ORDER — CLONIDINE HCL 0.1 MG PO TABS: 0.1000 mg | ORAL_TABLET | Freq: Three times a day (TID) | ORAL | 3 refills | Status: DC

## 2020-06-18 NOTE — Progress Notes (Signed)
Patient ID: Manuel Barrett                 DOB: Sep 18, 1945                      MRN: [id number]     HPI: Manuel Barrett is a 75 y.o. male referred by Dr. Martinique to HTN clinic. PMH includes CAD, hypertension, CKD, hyperlipidemia, PAD, and COPD. Gets most medication at [facility]).  Per Dr. Martinique, we will avoid diuretics for now d/t history of CKD with baseline Scr at 1.52.  During last OV with Dr. Martinique his BP was back up to 174/76 and clonidine 0.1mg  BID was initiated.  He presents today for HTN flollow up. Denies swelling, headaches, shortness of breath, increased fatigue, blurry vision or chest pain. Reports BP at home is usually well controlled, but never had his home BP cuff calibrated nor provide home BP records.  Current HTN meds:  Amlodipine 10mg  daily  Bisoprolol 5mg  daily  Clonidine 0.1mg  twice daily Hydralazine 50mg  daily TID - 7am-3pm-11pm Olmesartan 40mg    Intolerance: Indomethacin  BP goal: <130/80  Family History: The patient's family history includes COPD in his father; Dementia in his mother; Heart failure in his father.  Social History: former smoker, occasional alcohol  Diet: Balanced with limited salt, 2 cups of coffee and lots of sodas; will work on decreasing amount of sodas per day  Exercise: activities of daily living including [place] BP readings: none provided  Wt Readings from Last 3 Encounters:  06/18/20 189 lb 12.8 oz (86.1 kg)  05/21/20 186 lb 4 oz (84.5 kg)  02/22/20 180 lb 9.6 oz (81.9 kg)   BP Readings from Last 3 Encounters:  06/18/20 (!) 154/70  05/21/20 (!) 174/76  02/22/20 (!) 172/88   Pulse Readings from Last 3 Encounters:  06/18/20 65  05/21/20 62  02/22/20 60    Past Medical History:  Diagnosis Date  . Arthritis   . Bladder tumor   . CAD in native artery followed by pcp at the [facility] in [place] 11-13-2016 pt denies S&S   per cardiac cath 03-06-2014  by dr Martinique--  moderate non-obstructive  cad  . Emphysema/COPD ([place])   . GERD (gastroesophageal reflux disease)   . Gout    per pt currently stable 11-13-2016  . History of kidney stones   . History of MRSA infection    right hand abscess 09/ 2016  . Hypertension   . Left inguinal hernia   . PAD (peripheral artery disease) ([place]) first dx 1994---  was followed by vascular dr Amedeo Plenty   s/p  fem-pop bypass graft 1994 and 2001/  pt is followed by the [facility] in [facility] 11-13-2016 denies s&s  . Rotator cuff syndrome of left shoulder   . Wears hearing aid in both ears     Current Outpatient Medications on File Prior to Visit  Medication Sig Dispense Refill  . allopurinol (ZYLOPRIM) 100 MG tablet Take 100 mg by mouth every morning.     Marland Kitchen amLODipine (NORVASC) 10 MG tablet Take 1 tablet (10 mg total) by mouth at bedtime. 90 tablet 1  . aspirin EC 81 MG tablet Take 1 tablet (81 mg total) by mouth daily. 90 tablet 3  . atorvastatin (LIPITOR) 80 MG tablet Take 40 mg by mouth daily.    . bisoprolol (ZEBETA) 5 MG tablet TAKE ONE (1) TABLET EACH DAY 90 tablet 1  . hydrALAZINE (APRESOLINE)  50 MG tablet TAKE ONE (1) TABLET THREE (3) TIMES EACH DAY 90 tablet 2  . nitroGLYCERIN (NITROSTAT) 0.4 MG SL tablet Place 1 tablet (0.4 mg total) under the tongue every 5 (five) minutes as needed for chest pain. 25 tablet 11  . olmesartan (BENICAR) 40 MG tablet TAKE ONE (1) TABLET EACH DAY 30 tablet 7  . omeprazole (PRILOSEC) 20 MG capsule Take 1 capsule (20 mg total) by mouth daily as needed (acid reflux). 90 capsule 1   No current facility-administered medications on file prior to visit.    Allergies  Allergen Reactions  . Indomethacin Other (See Comments)    Nose bleeds    Blood pressure (!) 154/70, pulse 65, resp. rate 15, height 5\' 10"  (1.778 m), weight 189 lb 12.8 oz (86.1 kg), SpO2 95 %.  Essential hypertension BP remains above goal. Will increase clonidine to 0.1mg  TID, and follow up in 4-5 weeks. Patient encouraged to bring home BP cuff to  office to calibrate divide and determine if "white coat syndrome" present.    Editha Bridgeforth Rodriguez-Guzman PharmD, BCPS, [place] [id number] 06/24/2020 5:07 PM

## 2020-06-18 NOTE — Patient Instructions (Addendum)
Return for a follow up appointment in 6 weeks  Check your blood pressure at home daily (if able) and keep record of the readings.  Take your BP meds as follows: INCREASE clonidine to 0.1mg  three times daily   Bring all of your meds, your BP cuff and your record of home blood pressures to your next appointment.  Exercise as you're able, try to walk approximately 30 minutes per day.  Keep salt intake to a minimum, especially watch canned and prepared boxed foods.  Eat more fresh fruits and vegetables and fewer canned items.  Avoid eating in fast food restaurants.    HOW TO TAKE YOUR BLOOD PRESSURE: . Rest 5 minutes before taking your blood pressure. .  Don't smoke or drink caffeinated beverages for at least 30 minutes before. . Take your blood pressure before (not after) you eat. . Sit comfortably with your back supported and both feet on the floor (don't cross your legs). . Elevate your arm to heart level on a table or a desk. . Use the proper sized cuff. It should fit smoothly and snugly around your bare upper arm. There should be enough room to slip a fingertip under the cuff. The bottom edge of the cuff should be 1 inch above the crease of the elbow. . Ideally, take 3 measurements at one sitting and record the average.

## 2020-06-24 ENCOUNTER — Encounter: Payer: Self-pay | Admitting: Pharmacist

## 2020-06-24 NOTE — Assessment & Plan Note (Signed)
BP remains above goal. Will increase clonidine to 0.1mg  TID, and follow up in 4-5 weeks. Patient encouraged to bring home BP cuff to office to calibrate divide and determine if "white coat syndrome" present.

## 2020-07-30 ENCOUNTER — Other Ambulatory Visit: Payer: Self-pay

## 2020-07-30 ENCOUNTER — Ambulatory Visit: Payer: PPO | Admitting: Pharmacist Clinician (PhC)/ Clinical Pharmacy Specialist

## 2020-07-30 DIAGNOSIS — I10 Essential (primary) hypertension: Secondary | ICD-10-CM

## 2020-07-30 NOTE — Patient Instructions (Signed)
Return for a a follow up appointment with Dr. Martinique in late summer (you should get a letter to schedule this in another few months)  Check your blood pressure at home 3-4 times each week and keep record of the readings.  Take your BP meds as follows:  Continue with all your current medications  Bring all of your meds, your BP cuff and your record of home blood pressures to your next appointment.  Exercise as you're able, try to walk approximately 30 minutes per day.  Keep salt intake to a minimum, especially watch canned and prepared boxed foods.  Eat more fresh fruits and vegetables and fewer canned items.  Avoid eating in fast food restaurants.    HOW TO TAKE YOUR BLOOD PRESSURE: . Rest 5 minutes before taking your blood pressure. .  Don't smoke or drink caffeinated beverages for at least 30 minutes before. . Take your blood pressure before (not after) you eat. . Sit comfortably with your back supported and both feet on the floor (don't cross your legs). . Elevate your arm to heart level on a table or a desk. . Use the proper sized cuff. It should fit smoothly and snugly around your bare upper arm. There should be enough room to slip a fingertip under the cuff. The bottom edge of the cuff should be 1 inch above the crease of the elbow. . Ideally, take 3 measurements at one sitting and record the average.

## 2020-07-30 NOTE — Assessment & Plan Note (Signed)
Patient with essential hypertension, at goal today in office.  Currently on 5 BP medications and tolerating them without issue.  Although his home readings are a little higher than office, will not make any changes to his medications today.  Will have him work on BP technique and check his pressure consistently first thing in the mornings, 3-4 days each week.  He should contact the office should he notice that his pressure increases or has other problems.  He should follow up with Dr. Martinique in 3-4 months.

## 2020-07-30 NOTE — Progress Notes (Signed)
Patient ID: [name]                 DOB: Apr 20, 1946                      MRN: [id number]     HPI: Manuel Barrett is a 75 y.o. male referred by Dr. Martinique to HTN clinic. PMH includes CAD, hypertension, CKD, hyperlipidemia, PAD, and COPD. Gets most medication at [facility] [place]).  Per Dr. Martinique, we will avoid diuretics for now d/t history of CKD with baseline Scr at 1.52.  During OV with Dr. Martinique his BP was back up to 174/76 and clonidine 0.1mg  BID was initiated.  At a follow up visit in CV[facility] the clonidine was increased to three times daily.  At that visit patient reported that home readings were doing better, but brought no readings or cuff with him.    Today he returns to the office for follow up.  He states doing well with current medications and has no side effects or other concerns.   States no challenges with middle-of-day doses of hydralazine and clonidine, but states that the time taken will vary within about 2 hours from day to day.  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    Current HTN meds:  Amlodipine 10mg  daily  Bisoprolol 5mg  daily  Clonidine 0.1mg  three times daily Hydralazine 50mg  daily TID - 7am-3pm-11pm Olmesartan 40mg    Intolerance: Indomethacin; avoid diuretics due to baseline SCr  BP goal: <130/80  Family History: The patient's family history includes COPD in his father; Dementia in his mother; Heart failure in his father.  Social History: former smoker, occasional alcohol;   Diet: Balanced with limited salt, 2 cups of coffee and lots of sodas; will work on decreasing amount of sodas per day  Exercise: activities of daily living including [place] BP readings: 20 readings mix of various times of day; home cuff with him in office today, read within 10 points of office cuff  Average 148/63, HR 54  Labs:  07/03/20:  Na 139, K 3.9, Glu 100, BUN 15, SCr 1.32 (CrCl 59.6)  Wt Readings from Last 3 Encounters:   07/30/20 183 lb 9.6 oz (83.3 kg)  06/18/20 189 lb 12.8 oz (86.1 kg)  05/21/20 186 lb 4 oz (84.5 kg)   BP Readings from Last 3 Encounters:  07/30/20 132/70  06/18/20 (!) 154/70  05/21/20 (!) 174/76   Pulse Readings from Last 3 Encounters:  07/30/20 (!) 56  06/18/20 65  05/21/20 62    Past Medical History:  Diagnosis Date  . Arthritis   . Bladder tumor   . CAD in native artery followed by pcp at the [facility] in [place] 11-13-2016 pt denies S&S   per cardiac cath 03-06-2014  by dr Martinique--  moderate non-obstructive cad  . Emphysema/COPD ([place])   . GERD (gastroesophageal reflux disease)   . Gout    per pt currently stable 11-13-2016  . History of kidney stones   . History of MRSA infection    right hand abscess 09/ 2016  . Hypertension   . Left inguinal hernia   . PAD (peripheral artery disease) ([place]) first dx 1994---  was followed by vascular dr Amedeo Plenty   s/p  fem-pop bypass graft 1994 and 2001/  pt is followed by the [facility] in [facility] 11-13-2016 denies s&s  . Rotator cuff syndrome of left shoulder   . Wears  hearing aid in both ears     Current Outpatient Medications on File Prior to Visit  Medication Sig Dispense Refill  . allopurinol (ZYLOPRIM) 100 MG tablet Take 100 mg by mouth every morning.     Marland Kitchen amLODipine (NORVASC) 10 MG tablet Take 1 tablet (10 mg total) by mouth at bedtime. 90 tablet 1  . aspirin EC 81 MG tablet Take 1 tablet (81 mg total) by mouth daily. 90 tablet 3  . atorvastatin (LIPITOR) 80 MG tablet Take 40 mg by mouth daily.    . bisoprolol (ZEBETA) 5 MG tablet TAKE ONE (1) TABLET EACH DAY 90 tablet 1  . cloNIDine (CATAPRES) 0.1 MG tablet Take 1 tablet (0.1 mg total) by mouth 3 (three) times daily. 270 tablet 3  . hydrALAZINE (APRESOLINE) 50 MG tablet TAKE ONE (1) TABLET THREE (3) TIMES EACH DAY 90 tablet 2  . nitroGLYCERIN (NITROSTAT) 0.4 MG SL tablet Place 1 tablet (0.4 mg total) under the tongue every 5 (five) minutes as needed for chest pain. 25 tablet 11   . olmesartan (BENICAR) 40 MG tablet TAKE ONE (1) TABLET EACH DAY 30 tablet 7  . omeprazole (PRILOSEC) 20 MG capsule Take 1 capsule (20 mg total) by mouth daily as needed (acid reflux). 90 capsule 1   No current facility-administered medications on file prior to visit.    Allergies  Allergen Reactions  . Indomethacin Other (See Comments)    Nose bleeds    Blood pressure 132/70, pulse (!) 56, resp. rate 17, height 5\' 10"  (1.778 m), weight 183 lb 9.6 oz (83.3 kg), SpO2 98 %.  Essential hypertension Patient with essential hypertension, at goal today in office.  Currently on 5 BP medications and tolerating them without issue.  Although his home readings are a little higher than office, will not make any changes to his medications today.  Will have him work on BP technique and check his pressure consistently first thing in the mornings, 3-4 days each week.  He should contact the office should he notice that his pressure increases or has other problems.  He should follow up with Dr. Martinique in 3-4 months.     Tommy Medal PharmD CPP [facility] [address] [place] [address] 08/01/2020 7:32 AM

## 2020-08-07 ENCOUNTER — Other Ambulatory Visit: Payer: Self-pay | Admitting: Cardiology

## 2020-08-12 DIAGNOSIS — C44612 Basal cell carcinoma of skin of right upper limb, including shoulder: Secondary | ICD-10-CM | POA: Diagnosis not present

## 2020-08-12 DIAGNOSIS — D225 Melanocytic nevi of trunk: Secondary | ICD-10-CM | POA: Diagnosis not present

## 2020-09-05 ENCOUNTER — Other Ambulatory Visit: Payer: Self-pay | Admitting: Cardiology

## 2020-09-19 DIAGNOSIS — Z08 Encounter for follow-up examination after completed treatment for malignant neoplasm: Secondary | ICD-10-CM | POA: Diagnosis not present

## 2020-09-19 DIAGNOSIS — Z85828 Personal history of other malignant neoplasm of skin: Secondary | ICD-10-CM | POA: Diagnosis not present

## 2020-11-12 DIAGNOSIS — R059 Cough, unspecified: Secondary | ICD-10-CM | POA: Diagnosis not present

## 2020-11-12 DIAGNOSIS — J0100 Acute maxillary sinusitis, unspecified: Secondary | ICD-10-CM | POA: Diagnosis not present

## 2020-11-18 ENCOUNTER — Other Ambulatory Visit: Payer: Self-pay | Admitting: Cardiology

## 2020-11-27 ENCOUNTER — Encounter (INDEPENDENT_AMBULATORY_CARE_PROVIDER_SITE_OTHER): Payer: Self-pay | Admitting: *Deleted

## 2020-12-06 NOTE — Progress Notes (Addendum)
12/09/2020   PCP: [facility] in [place].  Chief Complaint  Patient presents with   Coronary Artery Disease     Primary Cardiologist: Dr. Kenzee Bassin Martinique   HPI:  75 y.o. Male seen for follow up CAD. 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 Admitted with chest pain on 03/05/14. No MI and cardiac cath with non obstructive disease.  He does have 50-60% in LAD.  He was started on Statins.  Repeat Myoview in May 2019 was normal. He complained of dyspnea on his last evaluation. PFTs c/w COPD. Now followed by Dr. Melvyn Novas. Metoprolol switched to bisoprolol..  He has been followed at the [facility]. He gets most of his care here. He does note some dyspnea if he walks up hill with some tightness. This has not changed. His BP has been persistently high. On his last visit clonidine was added. Follow up in the HTN clinic revealed improved BP control. Renal duplex negative for RAS but stenosis of SMA was noted- incidental finding and asymptomatic.   He did have Covid 19 at the end of July and states he had a very mild case. He has been vaccinated. He has been keeping track of his BP and it has been well controlled. No chest pain or claudication. Stays very active moving lawns.    Allergies  Allergen Reactions   Indomethacin Other (See Comments)    Nose bleeds    Current Outpatient Medications  Medication Sig Dispense Refill   allopurinol (ZYLOPRIM) 100 MG tablet Take 100 mg by mouth every morning.      amLODipine (NORVASC) 10 MG tablet Take 1 tablet (10 mg total) by mouth at bedtime. 90 tablet 1   aspirin EC 81 MG tablet Take 1 tablet (81 mg total) by mouth daily. 90 tablet 3   atorvastatin (LIPITOR) 80 MG tablet Take 40 mg by mouth daily.     bisoprolol (ZEBETA) 5 MG tablet TAKE ONE (1) TABLET EACH DAY 90 tablet 3   cloNIDine (CATAPRES) 0.1 MG tablet Take 1 tablet (0.1 mg total) by mouth 3 (three) times daily. 270 tablet 3   hydrALAZINE (APRESOLINE)  50 MG tablet TAKE ONE (1) TABLET THREE (3) TIMES EACH DAY 90 tablet 2   nitroGLYCERIN (NITROSTAT) 0.4 MG SL tablet Place 1 tablet (0.4 mg total) under the tongue every 5 (five) minutes as needed for chest pain. 25 tablet 11   olmesartan (BENICAR) 40 MG tablet TAKE ONE (1) TABLET EACH DAY 90 tablet 3   omeprazole (PRILOSEC) 20 MG capsule Take 1 capsule (20 mg total) by mouth daily as needed (acid reflux). 90 capsule 1   No current facility-administered medications for this visit.    Past Medical History:  Diagnosis Date   Arthritis    Bladder tumor    CAD in native artery followed by pcp at the [facility] in [place] 11-13-2016 pt denies S&S   per cardiac cath 03-06-2014  by dr Martinique--  moderate non-obstructive cad   Emphysema/COPD ([place])    GERD (gastroesophageal reflux disease)    Gout    per pt currently stable 11-13-2016   History of kidney stones    History of MRSA infection    right hand abscess 09/ 2016   Hypertension    Left inguinal hernia    PAD (peripheral artery disease) ([place]) first dx 1994---  was followed by vascular dr Amedeo Plenty   s/p  fem-pop  bypass graft 1994 and 2001/  pt is followed by the [facility] in [place] 11-13-2016 denies s&s   Rotator cuff syndrome of left shoulder    Wears hearing aid in both ears     Past Surgical History:  Procedure Laterality Date   ANTERIOR CERVICAL DECOMP/DISCECTOMY FUSION  02/1999   AORTA - BILATERAL FEMORAL ARTERY BYPASS GRAFT  2001  dr Amedeo Plenty   APPENDECTOMY  1959   CYSTOSCOPY W/ RETROGRADES Bilateral 10/31/2019   Procedure: CYSTOSCOPY WITH BILATERAL RETROGRADE PYELOGRAM;  Surgeon: [name], MD;  Location: [facility];  Service: Urology;  Laterality: Bilateral;   CYSTOSCOPY WITH STENT PLACEMENT Left 11/26/2016   Procedure: CYSTOSCOPY WITH left double J STENT PLACEMENT;  Surgeon: [name], MD;  Location: [facility];  Service: Urology;  Laterality: Left;   FEMORAL-POPLITEAL BYPASS GRAFT  1994   dr Amedeo Plenty   I & D EXTREMITY Right  01/09/2015   Procedure: IRRIGATION AND DEBRIDEMENT RIGHT THUMB/HAND ;  Surgeon: Roseanne Kaufman, MD;  Location: [place];  Service: Orthopedics;  Laterality: Right;   LEFT HEART CATHETERIZATION WITH CORONARY ANGIOGRAM N/A 03/06/2014   Procedure: LEFT HEART CATHETERIZATION WITH CORONARY ANGIOGRAM;  Surgeon: Andrzej Scully M Martinique, MD;  Location: [facility] C[facility] LAB;  Service: Cardiovascular;  Laterality: N/A;  midLAD 50-60%; D1 20-30%; mCFX 30%; pRCA 30%;  ef 55-65%   LUMBAR SPINE SURGERY  1980's   TRANSURETHRAL RESECTION OF BLADDER TUMOR N/A 11/26/2016   Procedure: TRANSURETHRAL RESECTION OF BLADDER TUMOR (TURBT)/ left ureteroscopy;  Surgeon: [name], MD;  Location: [facility];  Service: Urology;  Laterality: N/A;   TRANSURETHRAL RESECTION OF BLADDER TUMOR WITH MITOMYCIN-C N/A 10/31/2019   Procedure: TRANSURETHRAL RESECTION OF BLADDER TUMOR WITH GEMCITABINE;  Surgeon: [name], MD;  [facility];  Service: Urology;  Laterality: N/A;    ROS:As noted in HPI. Otherwise ROS is negative.   Wt Readings from Last 3 Encounters:  12/09/20 176 lb (79.8 kg)  07/30/20 183 lb 9.6 oz (83.3 kg)  06/18/20 189 lb 12.8 oz (86.1 kg)    PHYSICAL EXAM BP 140/64 (BP Location: Right Arm)   Pulse (!) 49   Ht '5\' 10"'$  (1.778 m)   Wt 176 lb (79.8 kg)   SpO2 95%   BMI 25.25 kg/m  GENERAL:  Well appearing WM in NAD HEENT:  PERRL, EOMI, sclera are clear. Oropharynx is clear. NECK:  No jugular venous distention, carotid upstroke brisk and symmetric, no bruits, no thyromegaly or adenopathy LUNGS:  Clear to auscultation bilaterally CHEST:  Unremarkable HEART:  RRR,  PMI not displaced or sustained,S1 and S2 within normal limits, no S3, no S4: no clicks, no rubs, no murmurs ABD:  Soft, nontender. BS +, no masses or bruits. No hepatomegaly, no splenomegaly EXT:  2 + pulses throughout, no edema, no cyanosis no clubbing SKIN:  Warm and dry.  No rashes NEURO:  Alert and oriented x 3. Cranial nerves II through XII  intact. PSYCH:  Cognitively intact   Laboratory data:  Lab Results  Component Value Date   WBC 6.2 10/18/2019   HGB 15.2 10/18/2019   HCT 45.2 10/18/2019   PLT 262 10/18/2019   GLUCOSE 100 (H) 02/22/2020   CHOL 138 08/12/2015   TRIG 121 08/12/2015   HDL 30 (L) 08/12/2015   LDLCALC 84 08/12/2015   ALT 16 02/22/2020   AST 23 02/22/2020   NA 141 02/22/2020   K 3.8 02/22/2020   CL 103 02/22/2020   CREATININE 1.26 02/22/2020   BUN  11 02/22/2020   CO2 24 02/22/2020   TSH 1.040 03/05/2014   INR 1.15 03/05/2014   HGBA1C 6.4 (H) 03/05/2014   Labs from the [facility] 08/08/19: cholesterol 140, triglycerides 173, HDL 34, LDL 71. PSA 1.02, A1c 5.8%. CBC normal. Creatinine 1.6 BUN 19. Otherwise CMET normal.   Ecg today shows NSR rate 49. First degree AV block. I have personally reviewed and interpreted this study.   Lexiscan Myoview 09/01/17: Study Highlights   The left ventricular ejection fraction is normal (55-65%). Nuclear stress EF: 55%. There was no ST segment deviation noted during stress. The study is normal. This is a low risk study.    ASSESSMENT AND PLAN 1. 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 Last Myoveiw in 2019 was normal.  On good antianginal therapy with high dose amlodipine and bisoprolol. Asymptomatic.     2. HTN resistant. No RAS. On 5 medications but now with good control.   3. Hypercholesterolemia.  On high dose  lipitor. LDL was at goal 71. Labs followed at the [facility].  4. PAD s/p bifemoral BPG in 2001. Stable. ABIs in 2019 normal. Good pedal pulses.  5. COPD improved. Followed by Dr. Melvyn Novas.   FU 6 months.

## 2020-12-09 ENCOUNTER — Ambulatory Visit: Payer: PPO | Admitting: Cardiology

## 2020-12-09 ENCOUNTER — Encounter: Payer: Self-pay | Admitting: Cardiology

## 2020-12-09 ENCOUNTER — Other Ambulatory Visit: Payer: Self-pay

## 2020-12-09 ENCOUNTER — Other Ambulatory Visit: Payer: Self-pay | Admitting: Cardiology

## 2020-12-09 VITALS — BP 140/64 | HR 49 | Ht 70.0 in | Wt 176.0 lb

## 2020-12-09 DIAGNOSIS — I10 Essential (primary) hypertension: Secondary | ICD-10-CM | POA: Diagnosis not present

## 2020-12-09 DIAGNOSIS — I25118 Atherosclerotic heart disease of native coronary artery with other forms of angina pectoris: Secondary | ICD-10-CM | POA: Diagnosis not present

## 2020-12-09 DIAGNOSIS — E7800 Pure hypercholesterolemia, unspecified: Secondary | ICD-10-CM

## 2020-12-09 DIAGNOSIS — I739 Peripheral vascular disease, unspecified: Secondary | ICD-10-CM | POA: Diagnosis not present

## 2020-12-11 DIAGNOSIS — R800 Isolated proteinuria: Secondary | ICD-10-CM | POA: Diagnosis not present

## 2020-12-11 DIAGNOSIS — Z8551 Personal history of malignant neoplasm of bladder: Secondary | ICD-10-CM | POA: Diagnosis not present

## 2021-01-17 ENCOUNTER — Other Ambulatory Visit (INDEPENDENT_AMBULATORY_CARE_PROVIDER_SITE_OTHER): Payer: Self-pay

## 2021-01-17 ENCOUNTER — Encounter (INDEPENDENT_AMBULATORY_CARE_PROVIDER_SITE_OTHER): Payer: Self-pay | Admitting: *Deleted

## 2021-01-17 ENCOUNTER — Encounter (INDEPENDENT_AMBULATORY_CARE_PROVIDER_SITE_OTHER): Payer: Self-pay

## 2021-01-17 ENCOUNTER — Telehealth (INDEPENDENT_AMBULATORY_CARE_PROVIDER_SITE_OTHER): Payer: Self-pay

## 2021-01-17 DIAGNOSIS — Z1211 Encounter for screening for malignant neoplasm of colon: Secondary | ICD-10-CM

## 2021-01-17 MED ORDER — PEG 3350-KCL-NA BICARB-NACL 420 G PO SOLR: 4000.0000 mL | ORAL | 0 refills | Status: DC

## 2021-01-17 NOTE — Telephone Encounter (Signed)
Manuel Barrett, CMA  

## 2021-01-17 NOTE — Telephone Encounter (Signed)
Referring MD/PCP: [facility]  Procedure: Tcs  Reason/Indication:  Screening  Has patient had this procedure before?  yes  If so, when, by whom and where?  10 yrs ago  Is there a family history of colon cancer?  no  Who?  What age when diagnosed?    Is patient diabetic? If yes, Type 1 or Type 2   no      Does patient have prosthetic heart valve or mechanical valve?  no  Do you have a pacemaker/defibrillator?  no  Has patient ever had endocarditis/atrial fibrillation? no  Does patient use oxygen? no  Has patient had joint replacement within last 12 months?  no  Is patient constipated or do they take laxatives? no  Does patient have a history of alcohol/drug use?  no  Have you had a stroke/heart attack last 6 mths? no  Do you take medicine for weight loss?  no  For male patients,: do you still have your menstrual cycle? N/A  Is patient on blood thinner such as Coumadin, Plavix and/or Aspirin? yes  Medications: asa 81 mg daily amlodipine 10 mg daily, clonidine 0.1 mg daily, hydralazine 50 mg daily, Olmesartann 40 mg daily, bisoprolol 5 mg daily, allopurinol 100 mg daily, omeprazole 30 mg daily  Allergies: nkda  Medication Adjustment per Dr Laural Golden  Hold Aspirin 81 mg 2 days prior to procedure  Procedure date & time: 02/06/21 10:30

## 2021-02-06 ENCOUNTER — Other Ambulatory Visit: Payer: Self-pay

## 2021-02-06 ENCOUNTER — Ambulatory Visit (HOSPITAL_COMMUNITY)
Admission: RE | Admit: 2021-02-06 | Discharge: 2021-02-06 | Disposition: A | Discharge status: Home / Self Care | Payer: No Typology Code available for payment source | Attending: Internal Medicine | Admitting: Internal Medicine

## 2021-02-06 ENCOUNTER — Encounter (HOSPITAL_COMMUNITY)
Admission: RE | Disposition: A | Discharge status: Home / Self Care | Payer: Self-pay | Source: Home / Self Care | Attending: Internal Medicine

## 2021-02-06 ENCOUNTER — Encounter (HOSPITAL_COMMUNITY): Payer: Self-pay | Admitting: Internal Medicine

## 2021-02-06 DIAGNOSIS — K5520 Angiodysplasia of colon without hemorrhage: Secondary | ICD-10-CM | POA: Insufficient documentation

## 2021-02-06 DIAGNOSIS — Z888 Allergy status to other drugs, medicaments and biological substances status: Secondary | ICD-10-CM | POA: Diagnosis not present

## 2021-02-06 DIAGNOSIS — Z1211 Encounter for screening for malignant neoplasm of colon: Secondary | ICD-10-CM | POA: Insufficient documentation

## 2021-02-06 DIAGNOSIS — K644 Residual hemorrhoidal skin tags: Secondary | ICD-10-CM | POA: Insufficient documentation

## 2021-02-06 DIAGNOSIS — Z79899 Other long term (current) drug therapy: Secondary | ICD-10-CM | POA: Insufficient documentation

## 2021-02-06 DIAGNOSIS — Z8249 Family history of ischemic heart disease and other diseases of the circulatory system: Secondary | ICD-10-CM | POA: Insufficient documentation

## 2021-02-06 DIAGNOSIS — Z7982 Long term (current) use of aspirin: Secondary | ICD-10-CM | POA: Diagnosis not present

## 2021-02-06 DIAGNOSIS — Z87891 Personal history of nicotine dependence: Secondary | ICD-10-CM | POA: Diagnosis not present

## 2021-02-06 HISTORY — PX: COLONOSCOPY: SHX5424

## 2021-02-06 LAB — HM COLONOSCOPY

## 2021-02-06 SURGERY — COLONOSCOPY
Anesthesia: Moderate Sedation

## 2021-02-06 MED ORDER — SODIUM CHLORIDE 0.9 % IV SOLN: INTRAVENOUS | Status: DC

## 2021-02-06 MED ORDER — MEPERIDINE HCL 50 MG/ML IJ SOLN
INTRAMUSCULAR | Status: DC | PRN
  Administered 2021-02-06 (×2): 25 mg

## 2021-02-06 MED ORDER — MIDAZOLAM HCL 5 MG/5ML IJ SOLN
INTRAMUSCULAR | Status: AC
  Filled 2021-02-06: qty 10

## 2021-02-06 MED ORDER — MEPERIDINE HCL 50 MG/ML IJ SOLN
INTRAMUSCULAR | Status: AC
  Filled 2021-02-06: qty 1

## 2021-02-06 MED ORDER — MIDAZOLAM HCL 5 MG/5ML IJ SOLN
INTRAMUSCULAR | Status: DC | PRN
  Administered 2021-02-06 (×2): 2 mg via INTRAVENOUS
  Administered 2021-02-06: 1 mg via INTRAVENOUS

## 2021-02-06 NOTE — H&P (Signed)
Manuel Barrett is an 75 y.o. male.   Chief Complaint: Patient is here for colonoscopy. HPI: Patient is 75 year old Caucasian male who is here for screening colonoscopy.  Last exam was over 10 years ago underwent open [place].  He has no GI complaints.  He denies abdominal pain diarrhea constipation or rectal bleeding.  His appetite is good and his weight stable.  He states busy and does a lot of physical activity.  He is on low-dose aspirin.  Last dose was 3 to 4 days ago. Family history is negative for CRC.  Past Medical History:  Diagnosis Date   Arthritis    Bladder tumor    CAD in native artery followed by pcp at the [facility] in [place] 11-13-2016 pt denies S&S   per cardiac cath 03-06-2014  by dr Martinique--  moderate non-obstructive cad   Emphysema/COPD ([place])    GERD (gastroesophageal reflux disease)    Gout    per pt currently stable 11-13-2016   History of kidney stones    History of MRSA infection    right hand abscess 09/ 2016   Hypertension    Left inguinal hernia    PAD (peripheral artery disease) ([place]) first dx 1994---  was followed by vascular dr Amedeo Plenty   s/p  fem-pop bypass graft 1994 and 2001/  pt is followed by the [facility] in [place] 11-13-2016 denies s&s   Rotator cuff syndrome of left shoulder    Wears hearing aid in both ears     Past Surgical History:  Procedure Laterality Date   ANTERIOR CERVICAL DECOMP/DISCECTOMY FUSION  02/1999   AORTA - BILATERAL FEMORAL ARTERY BYPASS GRAFT  2001  dr Amedeo Plenty   APPENDECTOMY  1959   CYSTOSCOPY W/ RETROGRADES Bilateral 10/31/2019   Procedure: [place];  Surgeon: [name], MD;  [facility];  Service: Urology;  Laterality: Bilateral;   CYSTOSCOPY WITH STENT PLACEMENT Left 11/26/2016   Procedure: CYSTOSCOPY WITH left double J STENT PLACEMENT;  Surgeon: [name], MD;  [facility];  Service: Urology;  Laterality: Left;   FEMORAL-POPLITEAL BYPASS GRAFT  1994   dr Amedeo Plenty   I  & D EXTREMITY Right 01/09/2015   Procedure: IRRIGATION AND DEBRIDEMENT RIGHT THUMB/HAND ;  Surgeon: Roseanne Kaufman, MD;  Location: [place];  Service: Orthopedics;  Laterality: Right;   LEFT HEART CATHETERIZATION WITH CORONARY ANGIOGRAM N/A 03/06/2014   Procedure: LEFT HEART CATHETERIZATION WITH CORONARY ANGIOGRAM;  Surgeon: Peter M Martinique, MD;  Location: [facility] LAB;  Service: Cardiovascular;  Laterality: N/A;  midLAD 50-60%; D1 20-30%; mCFX 30%; pRCA 30%;  ef 55-65%   LUMBAR SPINE SURGERY  1980's   TRANSURETHRAL RESECTION OF BLADDER TUMOR N/A 11/26/2016   Procedure: TRANSURETHRAL RESECTION OF BLADDER TUMOR (TURBT)/ left ureteroscopy;  Surgeon: [name], MD;  Location: [facility];  Service: Urology;  Laterality: N/A;   TRANSURETHRAL RESECTION OF BLADDER TUMOR WITH MITOMYCIN-C N/A 10/31/2019   Procedure: TRANSURETHRAL RESECTION OF BLADDER TUMOR WITH GEMCITABINE;  Surgeon: [name], MD;  [facility];  Service: Urology;  Laterality: N/A;    Family History  Problem Relation Age of Onset   Dementia Mother    Heart failure Father    COPD Father    Colon cancer Neg Hx    Social History:  reports that he quit smoking about 28 years ago. His smoking use included cigarettes. He has a 45.00 pack-year smoking history. He has never used smokeless tobacco. He reports that he  does not currently use alcohol. He reports that he does not use drugs.  Allergies:  Allergies  Allergen Reactions   Indomethacin Other (See Comments)    Nose bleeds    Medications Prior to Admission  Medication Sig Dispense Refill   allopurinol (ZYLOPRIM) 100 MG tablet Take 100 mg by mouth every morning.      amLODipine (NORVASC) 10 MG tablet Take 1 tablet (10 mg total) by mouth at bedtime. 90 tablet 1   aspirin EC 81 MG tablet Take 1 tablet (81 mg total) by mouth daily. 90 tablet 3   atorvastatin (LIPITOR) 80 MG tablet Take 40 mg by mouth daily.     bisoprolol (ZEBETA) 5 MG tablet TAKE ONE (1) TABLET EACH  DAY 90 tablet 3   cloNIDine (CATAPRES) 0.1 MG tablet Take 1 tablet (0.1 mg total) by mouth 3 (three) times daily. 270 tablet 3   hydrALAZINE (APRESOLINE) 50 MG tablet TAKE ONE (1) TABLET THREE (3) TIMES EACH DAY 90 tablet 2   nitroGLYCERIN (NITROSTAT) 0.4 MG SL tablet Place 1 tablet (0.4 mg total) under the tongue every 5 (five) minutes as needed for chest pain. 25 tablet 11   olmesartan (BENICAR) 40 MG tablet TAKE ONE (1) TABLET EACH DAY 90 tablet 3   omeprazole (PRILOSEC) 20 MG capsule Take 1 capsule (20 mg total) by mouth daily as needed (acid reflux). (Patient taking differently: Take 20 mg by mouth daily.) 90 capsule 1   polyethylene glycol-electrolytes (TRILYTE) 420 g solution Take 4,000 mLs by mouth as directed. 4000 mL 0    No results found for this or any previous visit (from the past 48 hour(s)). No results found.  Review of Systems  Blood pressure (!) 153/66, pulse (!) 51, temperature 97.8 F (36.6 C), temperature source Oral, resp. rate 16, height 5\' 10"  (1.778 m), weight 81.6 kg, SpO2 97 %. Physical Exam HENT:     Mouth/Throat:     Mouth: Mucous membranes are moist.     Pharynx: Oropharynx is clear.  Eyes:     General: No scleral icterus.    Conjunctiva/sclera: Conjunctivae normal.  Cardiovascular:     Rate and Rhythm: Normal rate and regular rhythm.     Heart sounds: Murmur heard.     Comments: Faint systolic murmur noted at aortic area Pulmonary:     Effort: Pulmonary effort is normal.     Breath sounds: Normal breath sounds.  Abdominal:     Comments: Abdomen is symmetrical with upper midline and right groin scars. Abdomen is soft and nontender with organomegaly or masses.  Musculoskeletal:        General: No swelling.     Cervical back: Neck supple.  Lymphadenopathy:     Cervical: No cervical adenopathy.  Skin:    General: Skin is warm and dry.  Neurological:     Mental Status: He is alert.     Assessment/Plan  Average risk screening  colonoscopy  Hildred Laser, MD 02/06/2021, 10:26 AM

## 2021-02-06 NOTE — Discharge Instructions (Signed)
Resume usual medication including aspirin as before. Resume usual diet. No driving for 24 hours. No more screening colonoscopies.

## 2021-02-06 NOTE — Op Note (Signed)
[facility] Main Patient Name: Manuel Barrett Procedure Date: 02/06/2021 10:11 AM MRN: [id number] Date of Birth: March 11, 1946 Attending MD: Hildred Laser , MD CSN: [id number] Age: 75 Admit Type: Outpatient Procedure:                Colonoscopy Indications:              Screening for colorectal malignant neoplasm Providers:                Hildred Laser, MD, Lambert Mody, Rosina Lowenstein,                            RN Referring MD:             [facility] Medicines:                Meperidine 50 mg IV, Midazolam 5 mg IV Complications:            No immediate complications. Estimated Blood Loss:     Estimated blood loss: none. Procedure:                Pre-Anesthesia Assessment:                           - Prior to the procedure, a History and Physical                            was performed, and patient medications and                            allergies were reviewed. The patient's tolerance of                            previous anesthesia was also reviewed. The risks                            and benefits of the procedure and the sedation                            options and risks were discussed with the patient.                            All questions were answered, and informed consent                            was obtained. Prior Anticoagulants: The patient has                            taken no previous anticoagulant or antiplatelet                            agents except for aspirin. ASA Grade Assessment: II                            - A patient with mild systemic disease. After  reviewing the risks and benefits, the patient was                            deemed in satisfactory condition to undergo the                            procedure.                           After obtaining informed consent, the colonoscope                            was passed under direct vision. Throughout the                            procedure,  the patient's blood pressure, pulse, and                            oxygen saturations were monitored continuously. The                            [id number]-HQ190L ([id number]) scope was introduced through                            the anus and advanced to the the cecum, identified                            by appendiceal orifice and ileocecal valve. The                            ileocecal valve, appendiceal orifice, and rectum                            were photographed. The colonoscopy was performed                            without difficulty. The patient tolerated the                            procedure well. The quality of the bowel                            preparation was excellent. The ileocecal valve,                            appendiceal orifice, and rectum were photographed. Scope In: 10:41:05 AM Scope Out: 10:55:38 AM Scope Withdrawal Time: 0 hours 11 minutes 56 seconds  Total Procedure Duration: 0 hours 14 minutes 33 seconds  Findings:      The perianal and digital rectal examinations were normal.      Two small angiodysplastic lesions without bleeding were found in the       cecum.      The exam was otherwise normal throughout the examined colon.      External hemorrhoids were found during retroflexion. The hemorrhoids  were medium-sized. Impression:               - Two non-bleeding colonic angiodysplastic lesions.                           - External hemorrhoids.                           - No specimens collected. Moderate Sedation:      Moderate (conscious) sedation was administered by the endoscopy nurse       and supervised by the endoscopist. The following parameters were       monitored: oxygen saturation, heart rate, blood pressure, CO2       capnography and response to care. Total physician intraservice time was       20 minutes. Recommendation:           - Patient has a contact number available for                            emergencies. The signs and  symptoms of potential                            delayed complications were discussed with the                            patient. Return to normal activities tomorrow.                            Written discharge instructions were provided to the                            patient.                           - Resume previous diet today.                           - Continue present medications.                           - No repeat colonoscopy due to age and the absence                            of advanced adenomas. Procedure Code(s):        --- Professional ---                           [phone], Colonoscopy, flexible; diagnostic, including                            collection of specimen(s) by brushing or washing,                            when performed (separate procedure)                           G0500, Moderate sedation services provided by  the                            same physician or other qualified health care                            professional performing a gastrointestinal                            endoscopic service that sedation supports,                            requiring the presence of an independent trained                            observer to assist in the monitoring of the                            patient's level of consciousness and physiological                            status; initial 15 minutes of intra-service time;                            patient age 21 years or older (additional time may                            be reported with [phone], as appropriate) Diagnosis Code(s):        --- Professional ---                           Z12.11, Encounter for screening for malignant                            neoplasm of colon                           K55.20, Angiodysplasia of colon without hemorrhage                           K64.4, Residual hemorrhoidal skin tags CPT copyright 2019 [employer] Association. All rights reserved. The codes documented  in this report are preliminary and upon coder review may  be revised to meet current compliance requirements. Hildred Laser, MD Hildred Laser, MD 02/06/2021 11:04:26 AM This report has been signed electronically. Number of Addenda: 0

## 2021-02-07 ENCOUNTER — Encounter (INDEPENDENT_AMBULATORY_CARE_PROVIDER_SITE_OTHER): Payer: Self-pay | Admitting: *Deleted

## 2021-02-11 ENCOUNTER — Encounter (HOSPITAL_COMMUNITY): Payer: Self-pay | Admitting: Internal Medicine

## 2021-03-11 ENCOUNTER — Other Ambulatory Visit: Payer: Self-pay | Admitting: Cardiology

## 2021-06-14 ENCOUNTER — Other Ambulatory Visit: Payer: Self-pay | Admitting: Cardiology

## 2021-07-15 NOTE — Progress Notes (Signed)
? ? ? ? ? ? ? ? ?07/21/2021 ? ? ?PCP: [facility] in [place]. ? ?Chief Complaint  ?Patient presents with  ? Coronary Artery Disease  ? ? ? ?Primary Cardiologist: Dr. Shian Goodnow [place]  ? ?HPI:  76 y.o. Male seen for follow up CAD. 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 Admitted with chest pain on 03/05/14. No MI and cardiac cath with non obstructive disease.  He does have 50-60% in LAD.  He was started on Statins.  Repeat Myoview in May 2019 was normal. He complained of dyspnea on his last evaluation. PFTs c/w COPD. Now followed by Dr. Melvyn Novas. Metoprolol switched to bisoprolol. ? ?He has been followed at the [place]. He gets most of his care here. He does note some dyspnea if he walks up hill with some tightness. This has not changed. His BP has been persistently high. On his last visit clonidine was added. Follow up in the HTN clinic revealed improved BP control. Renal duplex negative for RAS but stenosis of SMA was noted- incidental finding and asymptomatic.  ? ?On follow up today he is doing well. He does get SOB with exertion- relieved with rest. Saw Dr Melvyn Novas last in 2019 - dx with COPD stage 2. Mild chest pain at times. Rare claudication. Not smoking. Active mowing lawns and gardening. Overall feels well. Labs followed at [facility]. ? ? ?Allergies  ?Allergen Reactions  ? Indomethacin Other (See Comments)  ?  Nose bleeds  ? ? ?Current Outpatient Medications  ?Medication Sig Dispense Refill  ? allopurinol (ZYLOPRIM) 100 MG tablet Take 100 mg by mouth every morning.     ? amLODipine (NORVASC) 10 MG tablet Take 1 tablet (10 mg total) by mouth at bedtime. 90 tablet 1  ? aspirin EC 81 MG tablet Take 1 tablet (81 mg total) by mouth daily. 90 tablet 3  ? atorvastatin (LIPITOR) 80 MG tablet Take 40 mg by mouth daily.    ? bisoprolol (ZEBETA) 5 MG tablet TAKE ONE (1) TABLET EACH DAY 90 tablet 3  ? cloNIDine (CATAPRES) 0.1 MG tablet Take 1 tablet (0.1 mg total) by mouth 3 (three) times daily. 270  tablet 3  ? hydrALAZINE (APRESOLINE) 50 MG tablet TAKE ONE (1) TABLET THREE (3) TIMES EACH DAY 90 tablet 2  ? nitroGLYCERIN (NITROSTAT) 0.4 MG SL tablet Place 1 tablet (0.4 mg total) under the tongue every 5 (five) minutes as needed for chest pain. 25 tablet 11  ? olmesartan (BENICAR) 40 MG tablet TAKE ONE (1) TABLET EACH DAY 90 tablet 3  ? omeprazole (PRILOSEC) 20 MG capsule Take 1 capsule (20 mg total) by mouth daily as needed (acid reflux). (Patient taking differently: Take 20 mg by mouth daily.) 90 capsule 1  ? ?No current facility-administered medications for this visit.  ? ? ?Past Medical History:  ?Diagnosis Date  ? Arthritis   ? Bladder tumor   ? CAD in native artery followed by pcp at the [place] in [place] 11-13-2016 pt denies S&S  ? per cardiac cath 03-06-2014  by dr Martinique--  moderate non-obstructive cad  ? Emphysema/COPD [facility])   ? GERD (gastroesophageal reflux disease)   ? Gout   ? per pt currently stable 11-13-2016  ? History of kidney stones   ? History of MRSA infection   ? right hand abscess 09/ 2016  ? Hypertension   ? Left inguinal hernia   ? PAD (peripheral artery disease) ([place]) first dx 1994---  was followed by vascular dr Amedeo Plenty  ? s/p  fem-pop bypass graft 1994 and 2001/  pt is followed by the [facility] in [facility] 11-13-2016 denies s&s  ? Rotator cuff syndrome of left shoulder   ? Wears hearing aid in both ears   ? ? ?Past Surgical History:  ?Procedure Laterality Date  ? ANTERIOR CERVICAL DECOMP/DISCECTOMY FUSION  02/1999  ? AORTA - BILATERAL FEMORAL ARTERY BYPASS GRAFT  2001  dr Amedeo Plenty  ? APPENDECTOMY  1959  ? COLONOSCOPY N/A 02/06/2021  ? Procedure: COLONOSCOPY;  Surgeon: Rogene Houston, MD;  [facility];  Service: Endoscopy;  Laterality: N/A;  10:30  ? CYSTOSCOPY W/ RETROGRADES Bilateral 10/31/2019  ? Procedure: CYSTOSCOPY WITH BILATERAL RETROGRADE PYELOGRAM;  Surgeon: [name], MD;  Location: [facility];  Service: Urology;  Laterality: Bilateral;  ? CYSTOSCOPY WITH STENT PLACEMENT  Left 11/26/2016  ? Procedure: CYSTOSCOPY WITH left double J STENT PLACEMENT;  Surgeon: [name], MD;  Location: [facility];  Service: Urology;  Laterality: Left;  ? FEMORAL-POPLITEAL BYPASS GRAFT  1994   dr Amedeo Plenty  ? I & D EXTREMITY Right 01/09/2015  ? Procedure: IRRIGATION AND DEBRIDEMENT RIGHT THUMB/HAND ;  Surgeon: Roseanne Kaufman, MD;  Location: [facility];  Service: Orthopedics;  Laterality: Right;  ? LEFT HEART CATHETERIZATION WITH CORONARY ANGIOGRAM N/A 03/06/2014  ? Procedure: LEFT HEART CATHETERIZATION WITH CORONARY ANGIOGRAM;  Surgeon: Rebecah Dangerfield M Martinique, MD;  Location: [facility] CATH LAB;  Service: Cardiovascular;  Laterality: N/A;  midLAD 50-60%; D1 20-30%; mCFX 30%; pRCA 30%;  ef 55-65%  ? LUMBAR SPINE SURGERY  1980's  ? TRANSURETHRAL RESECTION OF BLADDER TUMOR N/A 11/26/2016  ? Procedure: TRANSURETHRAL RESECTION OF BLADDER TUMOR (TURBT)/ left ureteroscopy;  Surgeon: [name], MD;  Location: [facility];  Service: Urology;  Laterality: N/A;  ? TRANSURETHRAL RESECTION OF BLADDER TUMOR WITH MITOMYCIN-C N/A 10/31/2019  ? Procedure: TRANSURETHRAL RESECTION OF BLADDER TUMOR WITH GEMCITABINE;  Surgeon: [name], MD;  Location: [facility];  Service: Urology;  Laterality: N/A;  ? ? ?ROS:As noted in HPI. Otherwise ROS is negative.  ? ?Wt Readings from Last 3 Encounters:  ?07/21/21 185 lb 9.6 oz (84.2 kg)  ?02/06/21 180 lb (81.6 kg)  ?12/09/20 176 lb (79.8 kg)  ? ? ?PHYSICAL EXAM ?BP 136/74   Pulse (!) 56   Ht '5\' 10"'$  (1.778 m)   Wt 185 lb 9.6 oz (84.2 kg)   SpO2 96%   BMI 26.63 kg/m?  ?GENERAL:  Well appearing WM in NAD ?HEENT:  PERRL, EOMI, sclera are clear. Oropharynx is clear. ?NECK:  No jugular venous distention, carotid upstroke brisk and symmetric, no bruits, no thyromegaly or adenopathy ?LUNGS:  Clear to auscultation bilaterally ?CHEST:  Unremarkable ?HEART:  RRR,  PMI not displaced or sustained,S1 and S2 within normal limits, no S3, no S4: no clicks, no rubs, no murmurs ?ABD:  Soft,  nontender. BS +, no masses or bruits. No hepatomegaly, no splenomegaly ?EXT:  2 + pulses throughout, no edema, no cyanosis no clubbing ?SKIN:  Warm and dry.  No rashes ?NEURO:  Alert and oriented x 3. Cranial nerves II through XII intact. ?PSYCH:  Cognitively intact ? ? ?Laboratory data:  ?Lab Results  ?Component Value Date  ? WBC 6.2 10/18/2019  ? HGB 15.2 10/18/2019  ? HCT 45.2 10/18/2019  ? PLT 262 10/18/2019  ? GLUCOSE 100 (H) 02/22/2020  ? CHOL 138 08/12/2015  ? TRIG 121 08/12/2015  ? HDL 30 (L) 08/12/2015  ? [place]  84 08/12/2015  ? ALT 16 02/22/2020  ? AST 23 02/22/2020  ? NA 141 02/22/2020  ? K 3.8 02/22/2020  ? CL 103 02/22/2020  ? CREATININE 1.26 02/22/2020  ? BUN 11 02/22/2020  ? CO2 24 02/22/2020  ? TSH 1.040 03/05/2014  ? INR 1.15 03/05/2014  ? HGBA1C 6.4 (H) 03/05/2014  ? ?Labs from the [place] 08/08/19: cholesterol 140, triglycerides 173, HDL 34, LDL 71. PSA 1.02, A1c 5.8%. CBC normal. Creatinine 1.6 BUN 19. Otherwise CMET normal.  ? ?Ecg not done today ? ? ?Lexiscan Myoview 09/01/17: Study Highlights  ? ?The left ventricular ejection fraction is normal (55-65%). ?Nuclear stress EF: 55%. ?There was no ST segment deviation noted during stress. ?The study is normal. ?This is a low risk study.  ? ? ?ASSESSMENT AND PLAN ?1. 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 Last Myoveiw in 2019 was normal.  On good antianginal therapy with high dose amlodipine and bisoprolol. Class 1 symptoms ?   ?2. HTN resistant. No RAS. On 5 medications but now with good control.  ? ?3. Hypercholesterolemia.  On high dose  lipitor. Labs followed at the [place]. I asked him to get me a copy ? ?4. PAD s/p bifemoral BPG in 2001. Stable. ABIs in 2019 normal. Good pedal pulses. ? ?5. COPD   ? ?FU 6 months. ? ? ?

## 2021-07-21 ENCOUNTER — Other Ambulatory Visit: Payer: Self-pay | Admitting: Cardiology

## 2021-07-21 ENCOUNTER — Ambulatory Visit: Payer: PPO | Admitting: Cardiology

## 2021-07-21 ENCOUNTER — Encounter: Payer: Self-pay | Admitting: Cardiology

## 2021-07-21 VITALS — BP 136/74 | HR 56 | Ht 70.0 in | Wt 185.6 lb

## 2021-07-21 DIAGNOSIS — E7800 Pure hypercholesterolemia, unspecified: Secondary | ICD-10-CM

## 2021-07-21 DIAGNOSIS — I10 Essential (primary) hypertension: Secondary | ICD-10-CM | POA: Diagnosis not present

## 2021-07-21 DIAGNOSIS — I739 Peripheral vascular disease, unspecified: Secondary | ICD-10-CM | POA: Diagnosis not present

## 2021-07-21 DIAGNOSIS — I25118 Atherosclerotic heart disease of native coronary artery with other forms of angina pectoris: Secondary | ICD-10-CM

## 2021-08-28 ENCOUNTER — Other Ambulatory Visit: Payer: Self-pay | Admitting: Cardiology

## 2021-11-06 ENCOUNTER — Other Ambulatory Visit: Payer: Self-pay | Admitting: Urology

## 2021-11-21 NOTE — Patient Instructions (Signed)
DUE TO COVID-19 ONLY TWO VISITORS  (aged 74 and older)  ARE ALLOWED TO COME WITH YOU AND STAY IN THE WAITING ROOM ONLY DURING PRE OP AND PROCEDURE.   **NO VISITORS ARE ALLOWED IN THE SHORT STAY AREA OR RECOVERY ROOM!!**  IF YOU WILL BE ADMITTED INTO THE HOSPITAL YOU ARE ALLOWED ONLY FOUR SUPPORT PEOPLE DURING VISITATION HOURS ONLY (7 AM -8PM)   The support person(s) must pass our screening, gel in and out, and wear a mask at all times, including in the patient's room. Patients must also wear a mask when staff or their support person are in the room. Visitors GUEST BADGE MUST BE WORN VISIBLY  One adult visitor may remain with you overnight and MUST be in the room by 8 P.M.     Your procedure is scheduled on: 12/02/21   Report to [facility] Main Entrance    Report to admitting at : 5:30 AM   Call this number if you have problems the morning of surgery [phone]   Do not eat food :After Midnight.   After Midnight you may have the following liquids until : 4:30 AM  DAY OF SURGERY  Water Black Coffee (sugar ok, NO MILK/CREAM OR CREAMERS)  Tea (sugar ok, NO MILK/CREAM OR CREAMERS) regular and decaf                             Plain Jell-O (NO RED)                                           Fruit ices (not with fruit pulp, NO RED)                                     Popsicles (NO RED)                                                                  Juice: apple, WHITE grape, WHITE cranberry Sports drinks like Gatorade (NO RED)              FOLLOW BOWEL PREP AND ANY ADDITIONAL PRE OP INSTRUCTIONS YOU RECEIVED FROM YOUR SURGEON'S OFFICE!!!   Oral Hygiene is also important to reduce your risk of infection.                                    Remember - BRUSH YOUR TEETH THE MORNING OF SURGERY WITH YOUR REGULAR TOOTHPASTE   Do NOT smoke after Midnight   Take these medicines the morning of surgery with A SIP OF WATER:  clonidine,hydralazine,bisoprolol,amlodipine,allopurinol,omeprazole.Tylenol as needed.  DO NOT TAKE ANY ORAL DIABETIC MEDICATIONS DAY OF YOUR SURGERY  Bring CPAP mask and tubing day of surgery.                              You may not have any metal on your body including hair pins, jewelry, and body  piercing             Do not wear lotions, powders, perfumes/cologne, or deodorant              Men may shave face and neck.   Do not bring valuables to the hospital. [place].   Contacts, dentures or bridgework may not be worn into surgery.   Bring small overnight bag day of surgery.   DO NOT [place]. PHARMACY WILL DISPENSE MEDICATIONS LISTED ON YOUR MEDICATION LIST TO YOU DURING YOUR ADMISSION [place]!    Patients discharged on the day of surgery will not be allowed to drive home.  Someone NEEDS to stay with you for the first 24 hours after anesthesia.   Special Instructions: Bring a copy of your healthcare power of attorney and living will documents         the day of surgery if you haven't scanned them before.              Please read over the following fact sheets you were given: IF YOU HAVE QUESTIONS ABOUT YOUR PRE-OP INSTRUCTIONS PLEASE CALL [phone]     [facility] - Preparing for Surgery Before surgery, you can play an important role.  Because skin is not sterile, your skin needs to be as free of germs as possible.  You can reduce the number of germs on your skin by washing with CHG (chlorahexidine gluconate) soap before surgery.  CHG is an antiseptic cleaner which kills germs and bonds with the skin to continue killing germs even after washing. Please DO NOT use if you have an allergy to CHG or antibacterial soaps.  If your skin becomes reddened/irritated stop using the CHG and inform your nurse when you arrive at Short Stay. Do not shave (including legs and underarms) for at least 48  hours prior to the first CHG shower.  You may shave your face/neck. Please follow these instructions carefully:  1.  Shower with CHG Soap the night before surgery and the  morning of Surgery.  2.  If you choose to wash your hair, wash your hair first as usual with your  normal  shampoo.  3.  After you shampoo, rinse your hair and body thoroughly to remove the  shampoo.                           4.  Use CHG as you would any other liquid soap.  You can apply chg directly  to the skin and wash                       Gently with a scrungie or clean washcloth.  5.  Apply the CHG Soap to your body ONLY FROM THE NECK DOWN.   Do not use on face/ open                           Wound or open sores. Avoid contact with eyes, ears mouth and genitals (private parts).                       Wash face,  Genitals (private parts) with your normal soap.  6.  Wash thoroughly, paying special attention to the area where your surgery  will be performed.  7.  Thoroughly rinse your body with warm water from the neck down.  8.  DO NOT shower/wash with your normal soap after using and rinsing off  the CHG Soap.                9.  Pat yourself dry with a clean towel.            10.  Wear clean pajamas.            11.  Place clean sheets on your bed the night of your first shower and do not  sleep with pets. Day of Surgery : Do not apply any lotions/deodorants the morning of surgery.  Please wear clean clothes to the hospital/surgery center.  FAILURE TO FOLLOW THESE INSTRUCTIONS MAY RESULT IN THE CANCELLATION OF YOUR SURGERY PATIENT SIGNATURE_________________________________  NURSE SIGNATURE__________________________________  ________________________________________________________________________

## 2021-11-24 ENCOUNTER — Encounter (HOSPITAL_COMMUNITY): Payer: Self-pay

## 2021-11-24 ENCOUNTER — Other Ambulatory Visit: Payer: Self-pay

## 2021-11-24 ENCOUNTER — Encounter (HOSPITAL_COMMUNITY)
Admission: RE | Admit: 2021-11-24 | Discharge: 2021-11-24 | Disposition: A | Discharge status: Home / Self Care | Payer: PPO | Source: Ambulatory Visit | Attending: Urology | Admitting: Urology

## 2021-11-24 VITALS — BP 136/64 | HR 55 | Temp 97.5°F | Ht 70.0 in | Wt 179.0 lb

## 2021-11-24 DIAGNOSIS — I739 Peripheral vascular disease, unspecified: Secondary | ICD-10-CM | POA: Diagnosis not present

## 2021-11-24 DIAGNOSIS — J449 Chronic obstructive pulmonary disease, unspecified: Secondary | ICD-10-CM | POA: Insufficient documentation

## 2021-11-24 DIAGNOSIS — Z01812 Encounter for preprocedural laboratory examination: Secondary | ICD-10-CM | POA: Diagnosis present

## 2021-11-24 DIAGNOSIS — I10 Essential (primary) hypertension: Secondary | ICD-10-CM | POA: Insufficient documentation

## 2021-11-24 DIAGNOSIS — I2510 Atherosclerotic heart disease of native coronary artery without angina pectoris: Secondary | ICD-10-CM | POA: Diagnosis not present

## 2021-11-24 HISTORY — DX: Malignant (primary) neoplasm, unspecified: C80.1

## 2021-11-24 LAB — CBC
HCT: 40.9 % (ref 39.0–52.0)
Hemoglobin: 13.6 g/dL (ref 13.0–17.0)
MCH: 30.6 pg (ref 26.0–34.0)
MCHC: 33.3 g/dL (ref 30.0–36.0)
MCV: 91.9 fL (ref 80.0–100.0)
Platelets: 225 10*3/uL (ref 150–400)
RBC: 4.45 MIL/uL (ref 4.22–5.81)
RDW: 13.5 % (ref 11.5–15.5)
WBC: 6.3 10*3/uL (ref 4.0–10.5)
nRBC: 0 % (ref 0.0–0.2)

## 2021-11-24 LAB — BASIC METABOLIC PANEL
Anion gap: 6 (ref 5–15)
BUN: 21 mg/dL (ref 8–23)
CO2: 23 mmol/L (ref 22–32)
Calcium: 9.1 mg/dL (ref 8.9–10.3)
Chloride: 110 mmol/L (ref 98–111)
Creatinine, Ser: 1.61 mg/dL — ABNORMAL HIGH (ref 0.61–1.24)
GFR, Estimated: 45 mL/min — ABNORMAL LOW (ref >60)
Glucose, Bld: 89 mg/dL (ref 70–99)
Potassium: 4.1 mmol/L (ref 3.5–5.1)
Sodium: 139 mmol/L (ref 135–145)

## 2021-11-24 NOTE — Progress Notes (Signed)
Lab. Results: Creatinine: 1.61

## 2021-11-24 NOTE — Progress Notes (Addendum)
For Short Stay: [place] appointment date: Date of COVID positive in last 5 days:  Bowel Prep reminder:   For Anesthesia: PCP - [facility] Cardiologist - Dr. [name]. LOV: 07/21/21  Chest x-ray -  EKG - 12/09/20 Stress Test -  ECHO -  Cardiac Cath -  Pacemaker/ICD device last checked: Pacemaker orders received: Device Rep notified:  Spinal Cord Stimulator:  Sleep Study -  CPAP -   Fasting Blood Sugar -  Checks Blood Sugar _____ times a day Date and result of last Hgb A1c-  Blood Thinner Instructions: Aspirin Instructions: Will be hold 5 days before: Dr. Jeffie Pollock. Last Dose:  Activity level: Can go up a flight of stairs and activities of daily living without stopping and without chest pain and/or shortness of breath   Able to exercise without chest pain and/or shortness of breath   Unable to go up a flight of stairs without chest pain and/or shortness of breath    Hx: COPD,Emphysema,PAD,HTN,CAD Anesthesia review:   Patient denies shortness of breath, fever, cough and chest pain at PAT appointment   Patient verbalized understanding of instructions that were given to them at the PAT appointment. Patient was also instructed that they will need to review over the PAT instructions again at home before surgery.

## 2021-11-25 NOTE — Anesthesia Preprocedure Evaluation (Addendum)
Anesthesia Evaluation  Patient identified by MRN, date of birth, ID band Patient awake    Reviewed: Allergy & Precautions, NPO status , Patient's Chart, lab work & pertinent test results  Airway Mallampati: II  TM Distance: >3 FB Neck ROM: Full    Dental  (+) Dental Advisory Given, Missing   Pulmonary COPD, former smoker,    Pulmonary exam normal breath sounds clear to auscultation       Cardiovascular hypertension, Pt. on home beta blockers and Pt. on medications + CAD and + Peripheral Vascular Disease  Normal cardiovascular exam Rhythm:Regular Rate:Normal  NMS 2019 The left ventricular ejection fraction is normal (55-65%). Nuclear stress EF: 55%. There was no ST segment deviation noted during stress. The study is normal. This is a low risk study.    Neuro/Psych negative neurological ROS     GI/Hepatic Neg liver ROS, GERD  ,  Endo/Other  negative endocrine ROS  Renal/GU negative Renal ROS     Musculoskeletal  (+) Arthritis ,   Abdominal   Peds  Hematology negative hematology ROS (+)   Anesthesia Other Findings   Reproductive/Obstetrics                          Anesthesia Physical Anesthesia Plan  ASA: 3  Anesthesia Plan: General   Post-op Pain Management: Tylenol PO (pre-op)* and Minimal or no pain anticipated   Induction: Intravenous  PONV Risk Score and Plan: 3 and Ondansetron, Dexamethasone and Treatment may vary due to age or medical condition  Airway Management Planned: LMA and Oral ETT  Additional Equipment:   Intra-op Plan:   Post-operative Plan: Extubation in OR  Informed Consent: I have reviewed the patients History and Physical, chart, labs and discussed the procedure including the risks, benefits and alternatives for the proposed anesthesia with the patient or authorized representative who has indicated his/her understanding and acceptance.     Dental  advisory given  Plan Discussed with: CRNA  Anesthesia Plan Comments: (See APP note by Durel Salts, FNP )      Anesthesia Quick Evaluation

## 2021-11-25 NOTE — Progress Notes (Signed)
Anesthesia Chart Review:   Case: [id number] Date/Time: 12/02/21 0715   Procedure: CYSTOSCOPY WITH BLADDER BIOPSY AND FULGURATION BILATERAL  RETROGRADES POSSIBLE LEFT URETEROSCOPY WITH BIOPSY POSSIBLE GEMCITABINE (Bilateral) - 1HR FOR CASE   Anesthesia type: General   Pre-op diagnosis: BLADDER TUMOR   Location: [place] [facility]   Surgeons: [name], MD       DISCUSSION: Pt is 76 years old with hx CAD (nonobstructive by 2015 cath), PAD (s/p FPBG 1994 and 2001), HTN, COPD  VS: BP 136/64   Pulse (!) 55   Temp (!) 36.4 C (Oral)   Ht '5\' 10"'$  (1.778 m)   Wt 81.2 kg   SpO2 95%   BMI 25.68 kg/m   PROVIDERS: - Primary care at [facility], [place] is Manuel Martinique, MD. Last office visit 07/21/21   LABS: Labs reviewed: Acceptable for surgery. (all labs ordered are listed, but only abnormal results are displayed)  Labs Reviewed  BASIC METABOLIC PANEL - Abnormal; Notable for the following components:      Result Value   Creatinine, Ser 1.61 (*)    GFR, Estimated 45 (*)    All other components within normal limits  CBC    EKG 12/09/20: sinus bradycardia, 1st degree AV block    CV: Carotid US 05/30/19: - Right Carotid: Velocities in the right ICA are consistent with a 1-39% stenosis.  - Left Carotid: Velocities in the left ICA are consistent with a 1-39% stenosis. Non-hemodynamically significant plaque <50% noted in the CCA.  - Vertebrals:  Bilateral vertebral arteries demonstrate antegrade flow.  - Subclavians: Normal flow hemodynamics were seen in bilateral subclavian arteries.   Nuclear stress test 09/01/17:  The left ventricular ejection fraction is normal (55-65%). Nuclear stress EF: 55%. There was no ST segment deviation noted during stress. The study is normal. This is a low risk study.   Cardiac cath 03/06/14:  1. Moderate nonobstructive CAD (LAD 50-60%; D1 20-30%; CX 30%; RCA 30% proximal, 20% mid and distal) 2. Normal LV function   Past  Medical History:  Diagnosis Date   Arthritis    Bladder tumor    CAD in native artery followed by pcp at the [place] in [place] 11-13-2016 pt denies S&S   per cardiac cath 03-06-2014  by dr [place]--  moderate non-obstructive cad   Cancer ([place])    Emphysema/COPD ([place])    GERD (gastroesophageal reflux disease)    Gout    per pt currently stable 11-13-2016   History of kidney stones    History of MRSA infection    right hand abscess 09/ 2016   Hypertension    Left inguinal hernia    PAD (peripheral artery disease) ([place]) first dx 1994---  was followed by vascular dr Amedeo Plenty   s/p  fem-pop bypass graft 1994 and 2001/  pt is followed by the [facility] in [place] 11-13-2016 denies s&s   Rotator cuff syndrome of left shoulder    Wears hearing aid in both ears     Past Surgical History:  Procedure Laterality Date   ANTERIOR CERVICAL DECOMP/DISCECTOMY FUSION  02/1999   AORTA - BILATERAL FEMORAL ARTERY BYPASS GRAFT  2001  dr Amedeo Plenty   APPENDECTOMY  1959   COLONOSCOPY N/A 02/06/2021   Procedure: COLONOSCOPY;  Surgeon: Rogene [place], MD;  [facility];  Service: Endoscopy;  Laterality: N/A;  10:30   CYSTOSCOPY W/ RETROGRADES Bilateral 10/31/2019   Procedure: CYSTOSCOPY WITH BILATERAL RETROGRADE PYELOGRAM;  Surgeon: [name],  MD;  Location: [facility];  Service: Urology;  Laterality: Bilateral;   CYSTOSCOPY WITH STENT PLACEMENT Left 11/26/2016   Procedure: CYSTOSCOPY WITH left double J STENT PLACEMENT;  Surgeon: [name], MD;  Location: [facility];  Service: Urology;  Laterality: Left;   FEMORAL-POPLITEAL BYPASS GRAFT  1994   dr Amedeo Plenty   I & D EXTREMITY Right 01/09/2015   Procedure: IRRIGATION AND DEBRIDEMENT RIGHT THUMB/HAND ;  Surgeon: Roseanne Kaufman, MD;  Location: [place];  Service: Orthopedics;  Laterality: Right;   LEFT HEART CATHETERIZATION WITH CORONARY ANGIOGRAM N/A 03/06/2014   Procedure: LEFT HEART CATHETERIZATION WITH CORONARY ANGIOGRAM;  Surgeon: Manuel M Martinique,  MD;  Location: [facility] CATH [facility];  Service: Cardiovascular;  Laterality: N/A;  midLAD 50-60%; D1 20-30%; mCFX 30%; pRCA 30%;  ef 55-65%   LUMBAR SPINE SURGERY  1980's   TRANSURETHRAL RESECTION OF BLADDER TUMOR N/A 11/26/2016   Procedure: TRANSURETHRAL RESECTION OF BLADDER TUMOR (TURBT)/ left ureteroscopy;  Surgeon: [name], MD;  Location: [facility];  Service: Urology;  Laterality: N/A;   TRANSURETHRAL RESECTION OF BLADDER TUMOR WITH MITOMYCIN-C N/A 10/31/2019   Procedure: TRANSURETHRAL RESECTION OF BLADDER TUMOR WITH GEMCITABINE;  Surgeon: [name], MD;  [facility];  Service: Urology;  Laterality: N/A;    MEDICATIONS:  acetaminophen (TYLENOL) 500 MG tablet   allopurinol (ZYLOPRIM) 100 MG tablet   amLODipine (NORVASC) 10 MG tablet   aspirin EC 81 MG tablet   atorvastatin (LIPITOR) 80 MG tablet   bisoprolol (ZEBETA) 5 MG tablet   cloNIDine (CATAPRES) 0.1 MG tablet   hydrALAZINE (APRESOLINE) 50 MG tablet   nitroGLYCERIN (NITROSTAT) 0.4 MG SL tablet   olmesartan (BENICAR) 40 MG tablet   omeprazole (PRILOSEC) 20 MG capsule   No current facility-administered medications for this encounter.    If no changes, I anticipate pt can proceed with surgery as scheduled.   [name], PhD, FNP-BC [facility]/Anesthesiology Phone: [phone] 11/25/2021 12:36 PM

## 2021-12-01 ENCOUNTER — Other Ambulatory Visit: Payer: Self-pay | Admitting: Cardiology

## 2021-12-01 NOTE — H&P (Signed)
I have bladder cancer that has been treated.  HPI: Manuel [place] is a 76 year-old male established patient who is here for follow-up of bladder cancer treatment.  His bladder cancer was superficial and limitied to the bladder lining. His bladder cancer was not muscle invasive.   He did have a TURBT.   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 He got BCG induction therapy. Last cysto was on 12/11/20. His UA is unremarkable and he has had no worrisome symptoms. HIs IPSS is 14.   GU Hx: Manuel [place] returns today in f/u for his history of LG NMIBC initially found on a TURBT in 8/18. He had a TURBT on 10/31/19 for a multifocal recurrence with [facility] in about 4 spots including the right wall, dome and left trigone. He required a left stent after his initial resection and there was some irregular mucosa adjacent to the resected orifice that is LG NMIBC on biopsy but ureteroscopy and RTG didn't show any more proximal tumor. He was given BCG induction which he completed in early September. He has no hematuria since the procedure. He is voiding without complaints. IPSS is 1.     AUA Symptom Score: He never has the sensation of not emptying his bladder completely after finishing urinating. Less than 50% of the time he has to urinate again fewer than two hours after he has finished urinating. 50% of the time he has to start and stop again several times when he urinates. He never finds it difficult to postpone urination. 50% of the time he has a weak urinary stream. 50% of the time he has to push or strain to begin urination. He has to get up to urinate 3 times from the time he goes to bed until the time he gets up in the morning.   Calculated AUA Symptom Score: 14    ALLERGIES: indomethacin - epistaxis    MEDICATIONS: Allopurinol 100 mg tablet  Aspirin 325 mg tablet  Omeprazole 20 mg capsule,delayed release  Amlodipine  Besylate 10 mg tablet  Atorvastatin Calcium 80 mg tablet  Bisoprolol Fumarate  Clonidine Hcl  Hydralazine Hcl 25 mg tablet 1 tablet PO Daily  Olmesartan Medoxomil 40 mg tablet     GU PSH: Bladder Instill AntiCA Agent - 12/26/2019, 12/19/2019, 12/12/2019, 12/05/2019, 11/28/2019, 11/21/2019, 2021 Cysto Bladder Ureth Biopsy - 2021 Cysto Remove Stent FB Sim - 2018 Cystoscopy - 05/07/2021, 12/11/2020, 06/12/2020, 03/13/2020, 2021, 2020, 2019, 2019, 2018 Cystoscopy Insert Stent, Left - 2018 Cystoscopy TURBT <2 cm - 2021 Cystoscopy TURBT 2-5 cm - 2018       PSH Notes: femoral bypass 1994, 2000   NON-GU PSH: Appendectomy (open) Back surgery Shoulder Surgery (Unspecified), Left     GU PMH: History of bladder cancer, No recurrent tumors seen. Cystoscopy in 6 months. - 05/07/2021, He has no recurrent tumors. he will return in 6 months for cystoscopy. He will need upper tract imaging at the subsequent f/u. , - 12/11/2020, No recurrent tumors seen. He will return in 6 months for cystoscopy. , - 06/12/2020, He has erythema on the left bladder wall that is probably BCG effect but I will get a cytology with reflex to [place] and if that is positive, we may need to do a biopsy. Otherwise he will return in 3 months for a cystoscopy. , - 03/13/2020, No recurrent tumors seen. F/U in 6 months for cystoscopy., - 2019 Proteinuria,  stable proteinuria. - 05/07/2021, 2+ protein today which is stable. , - 12/11/2020, He has 3+ protein on the dip UA but Cr of 1.08 and no edema. I will get a 24 hour urine for protein and CrCl., - 2018 Bladder Cancer overlapping sites - 12/26/2019, He has multifocal LG NMIBC including involvement at the left UO. He now has intermediate risk disease. I am going to have him return for BCG induction and reviewed the procedure and risks. He will need induction and then maintenance at 3 months and a year. , - 11/13/2019 Bladder Cancer Trigone - 2018, - 2018, He has a 3cm left trigone tumor that appears to  involve the left UO. I am going to get him set up for a TURBT with possible left ureteral stent and possible Epirubicin instillation. I have reviewed the risks of bleeding, infection, ureteral or bladder injury, chemical cystitis, need for a stent or restaging, thrombotic events or anesthetic complications. , - 2018 Gross hematuria, He had hematuria about 2-3 months ago and is a former smoker. The base of the bladder is hazy on CT and I will have him return for cystoscopy. - 2018 Hydrocele, The hydrocele is small and not associated with tenderness. It doesn't need therapy. - 2018 LLQ pain, He has left LQ/groin pain that is medial to his prior surgical incision without significant bulge. It may be a strain from his recent fall but he will f/u with general surgery as planned. - 2018 Renal calculus      PMH Notes: hydrocele, proteinuria   NON-GU PMH: Pyuria/other UA findings, Urine culture today. - 03/13/2020 Arthritis GERD Gout Hypercholesterolemia Hypertension    FAMILY HISTORY: Congestive Heart Failure - Father Prostate Cancer - Grandfather   SOCIAL HISTORY: Marital Status: Married Preferred Language: English; Race: White Current Smoking Status: Patient does not smoke anymore. Has not smoked since 10/18/1992. Smoked for 29 years.   Tobacco Use Assessment Completed: Used Tobacco in last 30 days? Has never drank.  Drinks 4+ caffeinated drinks per day. Patient's occupation Doctor, general practice.     Notes: 2 sons 1 daughter   REVIEW OF SYSTEMS:    GU Review Male:   Patient reports get up at night to urinate and stream starts and stops. Patient denies frequent urination, hard to postpone urination, burning/ pain with urination, leakage of urine, trouble starting your stream, have to strain to urinate , erection problems, and penile pain.  Gastrointestinal (Upper):   Patient denies nausea, vomiting, and indigestion/ heartburn.  Gastrointestinal (Lower):   Patient denies diarrhea and  constipation.  Constitutional:   Patient denies weight loss, fatigue, night sweats, and fever.  Skin:   Patient denies skin rash/ lesion and itching.  Eyes:   Patient denies blurred vision and double vision.  Ears/ Nose/ Throat:   Patient denies sore throat and sinus problems.  Hematologic/Lymphatic:   Patient denies swollen glands and easy bruising.  Cardiovascular:   Patient denies leg swelling and chest pains.  Respiratory:   Patient denies cough and shortness of breath.  Endocrine:   Patient denies excessive thirst.  Musculoskeletal:   Patient denies back pain and joint pain.  Neurological:   Patient denies headaches and dizziness.  Psychologic:   Patient denies depression and anxiety.   Notes: Weak stream    VITAL SIGNS: None   MULTI-SYSTEM PHYSICAL EXAMINATION:    Constitutional: Well-nourished. No physical deformities. Normally developed. Good grooming.  Respiratory: Normal breath sounds. No labored breathing, no use of accessory muscles.  Cardiovascular: Regular rate and rhythm. No murmur, no gallop.      Complexity of Data:  Records Review:   AUA Symptom Score, Previous Patient Records  Urine Test Review:   Urinalysis   PROCEDURES:         Flexible Cystoscopy - 52000  Risks, benefits, and some of the potential complications of the procedure were discussed. 86m of 2% lidocaine jelly was instilled intraurethrally.  Cipro '500mg'$  given for antibiotic prophylaxis.     Meatus:  Normal size. Normal location. Normal condition.  Urethra:  No strictures.  External Sphincter:  Normal.  Verumontanum:  Normal.  Prostate:  Borderline obstructing. Mild hyperplasia.  Bladder Neck:  Non-obstructing.  Ureteral Orifices:  right Normal location. Normal size. Normal shape. Effluxed clear urine. left has evidence of prior resection with some irregular mucosa bordering the UO.   Bladder:  Mild trabeculation. < 1/2 cm tumor. Solitary tumor lateral to the left UO with the abnormal mucosa  around the UO. No stones.      The procedure was well tolerated and there were no complications.         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   I am going to get him scheduled for cystoscopy with biopsy and fulguration with bil Retrograde and possible left ureteroscopy with biopsy and fulguration.   I have reviewed the risks of bleeding, infection, bladder injury, chemical cystitis, thrombotic events and anesthetic complications.    PLAN:           Schedule Return Visit/Planned Activity: Next Available Appointment - Schedule Surgery  Procedure: Unspecified Date - Cysto Fulgurate < 0.5 cm - [id number]: next avail  Procedure: Unspecified Date - Cysto Uretero Biopsy Fulgura -- 22633 left Notes: Next avail          Document

## 2021-12-02 ENCOUNTER — Ambulatory Visit (HOSPITAL_COMMUNITY): Payer: PPO

## 2021-12-02 ENCOUNTER — Encounter (HOSPITAL_COMMUNITY): Payer: Self-pay | Admitting: Urology

## 2021-12-02 ENCOUNTER — Ambulatory Visit (HOSPITAL_COMMUNITY)
Admission: RE | Admit: 2021-12-02 | Discharge: 2021-12-02 | Disposition: A | Discharge status: Home / Self Care | Payer: PPO | Source: Ambulatory Visit | Attending: Urology | Admitting: Urology

## 2021-12-02 ENCOUNTER — Ambulatory Visit (HOSPITAL_BASED_OUTPATIENT_CLINIC_OR_DEPARTMENT_OTHER): Payer: PPO | Admitting: Anesthesiology

## 2021-12-02 ENCOUNTER — Encounter (HOSPITAL_COMMUNITY)
Admission: RE | Disposition: A | Discharge status: Home / Self Care | Payer: Self-pay | Source: Ambulatory Visit | Attending: Urology

## 2021-12-02 ENCOUNTER — Ambulatory Visit (HOSPITAL_COMMUNITY): Payer: PPO | Admitting: Emergency Medicine

## 2021-12-02 ENCOUNTER — Other Ambulatory Visit: Payer: Self-pay

## 2021-12-02 DIAGNOSIS — D499 Neoplasm of unspecified behavior of unspecified site: Secondary | ICD-10-CM

## 2021-12-02 DIAGNOSIS — K219 Gastro-esophageal reflux disease without esophagitis: Secondary | ICD-10-CM | POA: Insufficient documentation

## 2021-12-02 DIAGNOSIS — I2510 Atherosclerotic heart disease of native coronary artery without angina pectoris: Secondary | ICD-10-CM | POA: Insufficient documentation

## 2021-12-02 DIAGNOSIS — I10 Essential (primary) hypertension: Secondary | ICD-10-CM | POA: Insufficient documentation

## 2021-12-02 DIAGNOSIS — M1990 Unspecified osteoarthritis, unspecified site: Secondary | ICD-10-CM

## 2021-12-02 DIAGNOSIS — J449 Chronic obstructive pulmonary disease, unspecified: Secondary | ICD-10-CM | POA: Diagnosis not present

## 2021-12-02 DIAGNOSIS — C678 Malignant neoplasm of overlapping sites of bladder: Secondary | ICD-10-CM | POA: Insufficient documentation

## 2021-12-02 DIAGNOSIS — Z87891 Personal history of nicotine dependence: Secondary | ICD-10-CM | POA: Diagnosis not present

## 2021-12-02 HISTORY — PX: CYSTOSCOPY WITH BIOPSY: SHX5122

## 2021-12-02 SURGERY — CYSTOSCOPY, WITH BIOPSY
Anesthesia: General | Site: Bladder | Laterality: Bilateral

## 2021-12-02 MED ORDER — PHENYLEPHRINE 80 MCG/ML (10ML) SYRINGE FOR IV PUSH (FOR BLOOD PRESSURE SUPPORT)
PREFILLED_SYRINGE | INTRAVENOUS | Status: AC
  Filled 2021-12-02: qty 10

## 2021-12-02 MED ORDER — IOHEXOL 300 MG/ML  SOLN
INTRAMUSCULAR | Status: DC | PRN
  Administered 2021-12-02: 30 mL

## 2021-12-02 MED ORDER — SODIUM CHLORIDE 0.9% FLUSH: 3.0000 mL | Freq: Two times a day (BID) | INTRAVENOUS | Status: DC

## 2021-12-02 MED ORDER — PROPOFOL 10 MG/ML IV BOLUS
INTRAVENOUS | Status: DC | PRN
  Administered 2021-12-02: 40 mg via INTRAVENOUS
  Administered 2021-12-02: 160 mg via INTRAVENOUS

## 2021-12-02 MED ORDER — AMISULPRIDE (ANTIEMETIC) 5 MG/2ML IV SOLN: 10.0000 mg | Freq: Once | INTRAVENOUS | Status: DC | PRN

## 2021-12-02 MED ORDER — FENTANYL CITRATE (PF) 100 MCG/2ML IJ SOLN
INTRAMUSCULAR | Status: DC | PRN
  Administered 2021-12-02: 50 ug via INTRAVENOUS

## 2021-12-02 MED ORDER — ONDANSETRON HCL 4 MG/2ML IJ SOLN
INTRAMUSCULAR | Status: AC
Start: 2021-12-02 — End: ?
  Filled 2021-12-02: qty 2

## 2021-12-02 MED ORDER — EPHEDRINE SULFATE-NACL 50-0.9 MG/10ML-% IV SOSY
PREFILLED_SYRINGE | INTRAVENOUS | Status: DC | PRN
  Administered 2021-12-02: 10 mg via INTRAVENOUS
  Administered 2021-12-02: 5 mg via INTRAVENOUS
  Administered 2021-12-02: 10 mg via INTRAVENOUS

## 2021-12-02 MED ORDER — DEXAMETHASONE SODIUM PHOSPHATE 10 MG/ML IJ SOLN
INTRAMUSCULAR | Status: AC
  Filled 2021-12-02: qty 1

## 2021-12-02 MED ORDER — EPHEDRINE 5 MG/ML INJ
INTRAVENOUS | Status: AC
  Filled 2021-12-02: qty 5

## 2021-12-02 MED ORDER — ORAL CARE MOUTH RINSE: 15.0000 mL | Freq: Once | OROMUCOSAL | Status: AC

## 2021-12-02 MED ORDER — ONDANSETRON HCL 4 MG/2ML IJ SOLN
INTRAMUSCULAR | Status: DC | PRN
  Administered 2021-12-02: 4 mg via INTRAVENOUS

## 2021-12-02 MED ORDER — CHLORHEXIDINE GLUCONATE 0.12 % MT SOLN
15.0000 mL | Freq: Once | OROMUCOSAL | Status: AC
  Administered 2021-12-02: 15 mL via OROMUCOSAL

## 2021-12-02 MED ORDER — PROPOFOL 10 MG/ML IV BOLUS
INTRAVENOUS | Status: AC
  Filled 2021-12-02: qty 20

## 2021-12-02 MED ORDER — HYDROCODONE-ACETAMINOPHEN 5-325 MG PO TABS
1.0000 | ORAL_TABLET | ORAL | 0 refills | Status: DC | PRN
Start: 2021-12-02 — End: 2022-02-23

## 2021-12-02 MED ORDER — FENTANYL CITRATE (PF) 100 MCG/2ML IJ SOLN
INTRAMUSCULAR | Status: AC
  Filled 2021-12-02: qty 2

## 2021-12-02 MED ORDER — CEFAZOLIN SODIUM-DEXTROSE 2-4 GM/100ML-% IV SOLN
2.0000 g | INTRAVENOUS | Status: AC
  Administered 2021-12-02: 2 g via INTRAVENOUS
  Filled 2021-12-02: qty 100

## 2021-12-02 MED ORDER — DEXAMETHASONE SODIUM PHOSPHATE 10 MG/ML IJ SOLN
INTRAMUSCULAR | Status: DC | PRN
  Administered 2021-12-02: 10 mg via INTRAVENOUS

## 2021-12-02 MED ORDER — GEMCITABINE CHEMO FOR BLADDER INSTILLATION 2000 MG
2000.0000 mg | Freq: Once | INTRAVENOUS | Status: DC
  Filled 2021-12-02: qty 52.6

## 2021-12-02 MED ORDER — LACTATED RINGERS IV SOLN: INTRAVENOUS | Status: DC

## 2021-12-02 MED ORDER — SODIUM CHLORIDE 0.9 % IR SOLN
Status: DC | PRN
  Administered 2021-12-02: 3000 mL

## 2021-12-02 MED ORDER — FENTANYL CITRATE PF 50 MCG/ML IJ SOSY: 25.0000 ug | PREFILLED_SYRINGE | INTRAMUSCULAR | Status: DC | PRN

## 2021-12-02 MED ORDER — LIDOCAINE 2% (20 MG/ML) 5 ML SYRINGE
INTRAMUSCULAR | Status: DC | PRN
  Administered 2021-12-02: 100 mg via INTRAVENOUS

## 2021-12-02 MED ORDER — SODIUM CHLORIDE 0.9 % IR SOLN
Status: DC | PRN
  Administered 2021-12-02: 1000 mL

## 2021-12-02 MED ORDER — LIDOCAINE 2% (20 MG/ML) 5 ML SYRINGE
INTRAMUSCULAR | Status: AC
  Filled 2021-12-02: qty 5

## 2021-12-02 SURGICAL SUPPLY — 25 items
BAG DRN RND TRDRP ANRFLXCHMBR (UROLOGICAL SUPPLIES)
BAG URINE DRAIN 2000ML AR STRL (UROLOGICAL SUPPLIES) IMPLANT
BAG URO CATCHER STRL LF (MISCELLANEOUS) ×2 IMPLANT
CATH FOLEY 2WAY SLVR  5CC 18FR (CATHETERS) ×2
CATH FOLEY 2WAY SLVR 5CC 18FR (CATHETERS) IMPLANT
CATH FOLEY 3WAY 30CC 22FR (CATHETERS) IMPLANT
CATH URETL OPEN 5X70 (CATHETERS) IMPLANT
DRAPE FOOT SWITCH (DRAPES) ×2 IMPLANT
ELECT REM PT RETURN 15FT ADLT (MISCELLANEOUS) ×2 IMPLANT
GLOVE SURG SS PI 8.0 STRL IVOR (GLOVE) ×2 IMPLANT
GOWN STRL REUS W/ TWL XL LVL3 (GOWN DISPOSABLE) ×1 IMPLANT
GOWN STRL REUS W/TWL XL LVL3 (GOWN DISPOSABLE) ×2
GUIDEWIRE STR DUAL SENSOR (WIRE) ×1 IMPLANT
HOLDER FOLEY CATH W/STRAP (MISCELLANEOUS) IMPLANT
KIT TURNOVER KIT A (KITS) ×2 IMPLANT
LOOP CUT BIPOLAR 24F LRG (ELECTROSURGICAL) ×1 IMPLANT
MANIFOLD NEPTUNE II (INSTRUMENTS) ×2 IMPLANT
PACK CYSTO (CUSTOM PROCEDURE TRAY) ×2 IMPLANT
STENT URET 6FRX24 CONTOUR (STENTS) ×1 IMPLANT
SUT ETHILON 3 0 PS 1 (SUTURE) IMPLANT
SYR 30ML LL (SYRINGE) ×1 IMPLANT
SYR TOOMEY IRRIG 70ML (MISCELLANEOUS)
SYRINGE TOOMEY IRRIG 70ML (MISCELLANEOUS) IMPLANT
TUBING CONNECTING 10 (TUBING) ×2 IMPLANT
TUBING UROLOGY SET (TUBING) ×2 IMPLANT

## 2021-12-02 NOTE — Op Note (Signed)
Procedure: 1.  Cystoscopy with bilateral retrograde pyelograms and interpretation. 2.  Left diagnostic ureteroscopy. 3.  Transurethral resection of 2 cm bladder tumor of overlapping sites. 4.  Cystoscopy with insertion of left double-J stent. 5. Application of fluoroscopy.   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  Surgeon: Dr. [name].  Anesthesia: General.  Specimens: 1.  Cup biopsy of left posterior wall tumor. 2.  Resection chips of left trigone and left distal ureteral tumor.  Drains: 6 French by 26 cm left contour double-J stent and Foley catheter.  EBL: None.  Complications: None.  Indications: The patient is a 76 year old male with a history of urothelial carcinoma who was found on recent surveillance cystoscopy to have a papillary lesion that appeared somewhat superior to the left ureteral orifice and there were mucosal changes around the ureteral orifice concerning for neoplasm.  It was felt that cystoscopy with biopsy of the lesions and ureteroscopy with retrograde pyelography was indicated.  Procedure: He was taken operating room where he was given Ancef.  A general anesthetic was induced.  He was placed in lithotomy position with fitted with PAS hose.  His perineum genitalia were prepped with Betadine solution and draped in usual sterile fashion.  Cystoscopy was performed using the 21 [place] scope and 30 degree lens.  Examination revealed a normal urethra.  The external sphincter was intact.  The prostatic urethra was 2 to 3 cm in length with lateral lobe hyperplasia and a small middle lobe with some evidence obstruction.  Examination of bladder bowel mild trabeculation.  There was a papillary lesion approximately 5-8 mm in size just superior to the left ureteral orifice on the posterior wall and there was  mucosal irregularity surrounding the left ureteral orifice within the prior resection bed that was concerning for recurrent urothelial neoplasm.  The right ureteral orifice was unremarkable.  After completion of the initial cystoscopy ureteroscopy was performed using the short semirigid dual-lumen ureteroscope to inspect the left distal ureter.  The ureter was easily entered and there was mucosal irregularity that extended approximately 1 cm from the meatus however proximal that the mucosa was unremarkable up to approximately the iliacs.  Bilateral retrograde pyelography was then performed using a 5 [place] open-ended catheter and Omnipaque.  The right retrograde pyelogram demonstrated a normal ureter and intrarenal collecting system without filling defects.  The left retrograde pyelogram demonstrated an otherwise normal ureter with a normal intrarenal collecting system without filling defects or obstruction.  The cystoscope was then reinserted and a cup biopsy forceps was used to remove the papillary lesion above the left ureteral orifice.  The cystoscope was removed and the continuous-flow resectoscope sheath was then inserted with the aid of visual obturator.  This was fitted with an [place] handle, a bipolar loop and 30 degree lens and saline was used as the irrigant.  The ureteral orifice was then resected approximately 1 cm with the surrounding irregular mucosa removed as well.  After resection the resection bed was contiguous with the biopsy bed which was generously fulgurated to provide hemostasis but care was taken to avoid cautery around the ureteral orifice.  The resectoscope was removed and the ureteroscope was then used to cannulate the ureteral orifice.  No residual tumor was noted.  A sensor wire was then advanced to the kidney through the ureteroscope under fluoroscopic guidance and the ureteroscope was  removed.  The cystoscope was reinserted over the wire and a 6 [place] by 26 cm  contour double-J stent without tether was passed to the kidney under fluoroscopic guidance.  Wire was removed, leaving a good coil in the kidney and a good coil in the bladder.  The bladder was evacuated free of the resection specimen and the cystoscope was removed.  An 34 French Foley catheter was inserted.  The balloon was filled with 1010 mL of sterile fluid and the catheter was placed to straight drainage.  Patient is taken down from lithotomy position, his anesthetic was reversed and he was moved recovery in stable condition.  There were no complications.

## 2021-12-02 NOTE — Anesthesia Procedure Notes (Signed)
Procedure Name: LMA Insertion Date/Time: 12/02/2021 7:35 AM  Performed by: Lind Covert, CRNAPre-anesthesia Checklist: Patient identified, Emergency Drugs available, Suction available, Patient being monitored and Timeout performed Patient Re-evaluated:Patient Re-evaluated prior to induction Oxygen Delivery Method: Circle system utilized Preoxygenation: Pre-oxygenation with 100% oxygen Induction Type: IV induction LMA: LMA inserted LMA Size: 4.0 Tube type: Oral Number of attempts: 1 Placement Confirmation: positive ETCO2 and breath sounds checked- equal and bilateral Tube secured with: Tape Dental Injury: Teeth and Oropharynx as per pre-operative assessment

## 2021-12-02 NOTE — Transfer of Care (Signed)
Immediate Anesthesia Transfer of Care Note  Patient: Manuel [place]  Procedure(s) Performed: CYSTOSCOPY WITH BLADDER BIOPSY AND FULGURATION BILATERAL  RETROGRADES LEFT URETEROSCOPY  left stent placement (Bilateral: Bladder)  Patient Location: PACU  Anesthesia Type:General  Level of Consciousness: sedated  Airway & Oxygen Therapy: Patient Spontanous Breathing and Patient connected to face mask oxygen  Post-op Assessment: Report given to RN and Post -op Vital signs reviewed and stable  Post vital signs: Reviewed and stable  Last Vitals:  Vitals Value Taken Time  BP 137/57 12/02/21 0830  Temp    Pulse 64 12/02/21 0830  Resp 14 12/02/21 0830  SpO2 99 % 12/02/21 0830  Vitals shown include unvalidated device data.  Last Pain:  Vitals:   12/02/21 0621  TempSrc: Oral         Complications: No notable events documented.

## 2021-12-02 NOTE — Anesthesia Postprocedure Evaluation (Signed)
Anesthesia Post Note  Patient: Manuel [place]  Procedure(s) Performed: CYSTOSCOPY WITH BLADDER BIOPSY AND FULGURATION BILATERAL  RETROGRADES LEFT URETEROSCOPY  left stent placement (Bilateral: Bladder)     Patient location during evaluation: PACU Anesthesia Type: General Level of consciousness: sedated and patient cooperative Pain management: pain level controlled Vital Signs Assessment: post-procedure vital signs reviewed and stable Respiratory status: spontaneous breathing Cardiovascular status: stable Anesthetic complications: no   No notable events documented.  Last Vitals:  Vitals:   12/02/21 0930 12/02/21 0945  BP: 133/60 (!) 146/68  Pulse: 64 72  Resp: 10 13  Temp:    SpO2: 95% 93%    Last Pain:  Vitals:   12/02/21 0945  TempSrc:   PainSc: 0-No pain                 Nolon Nations

## 2021-12-02 NOTE — Interval H&P Note (Signed)
History and Physical Interval Note:  12/02/2021 7:09 AM  Manuel [place]  has presented today for surgery, with the diagnosis of BLADDER TUMOR.  The various methods of treatment have been discussed with the patient and family. After consideration of risks, benefits and other options for treatment, the patient has consented to  Procedure(s) with comments: CYSTOSCOPY WITH BLADDER BIOPSY AND FULGURATION BILATERAL  RETROGRADES POSSIBLE LEFT URETEROSCOPY WITH BIOPSY POSSIBLE GEMCITABINE (Bilateral) - 1HR FOR CASE as a surgical intervention.  The patient's history has been reviewed, patient examined, no change in status, stable for surgery.  I have reviewed the patient's chart and labs.  Questions were answered to the patient's satisfaction.     [name]

## 2021-12-03 ENCOUNTER — Encounter (HOSPITAL_COMMUNITY): Payer: Self-pay | Admitting: Urology

## 2021-12-03 LAB — SURGICAL PATHOLOGY

## 2022-01-19 ENCOUNTER — Ambulatory Visit: Payer: Non-veteran care | Admitting: Cardiology

## 2022-02-09 NOTE — Progress Notes (Signed)
02/09/2022   PCP: [facility] in [place].  No chief complaint on file.    Primary Cardiologist: Dr. Lanitra Battaglini Martin[place]   HPI:  76 y.o. Male seen for follow up CAD. 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 Admitted with chest pain on 03/05/14. No MI and cardiac cath with non obstructive disease.  Manuel does have 50-60% in LAD.  Manuel was started on Statins.  Repeat Myoview in May 2019 was normal. Manuel complained of dyspnea on his last evaluation. PFTs c/w COPD. Now followed by Dr. Melvyn Novas. Metoprolol switched to bisoprolol.  Manuel Barrett been followed at the [facility]. Manuel gets most of his care here. Manuel does note some dyspnea if Manuel walks up hill with some tightness. This Barrett not changed. Manuel Barrett resistant HTN but reports good control with reading 130-136/50-56 at home. Renal duplex in Feb 2022 was negative for RAS but stenosis of SMA was noted- incidental finding and asymptomatic.   On follow up today Manuel is doing well. Manuel does get SOB with exertion- relieved with rest. Saw Dr Melvyn Novas last in 2019 - dx with COPD stage 2. Mild chest pain at times. Rare claudication. Not smoking. Active mowing lawns and gardening. Overall feels well. Labs followed at [facility]  Manuel did undergo cystoscopy with TUR of bladder tumor by Dr Jeffie Pollock in August. Manuel is currently having weekly chemotherapy. To be completed this week.   Manuel was referred to nephrology for CKD. States US is planned. Had extensive lab work there last week.    Allergies  Allergen Reactions   Indocin [Indomethacin] Other (See Comments)    Nose bleeds    Current Outpatient Medications  Medication Sig Dispense Refill   acetaminophen (TYLENOL) 500 MG tablet Take 500-1,000 mg by mouth every 6 (six) hours as needed (pain.).     allopurinol (ZYLOPRIM) 100 MG tablet Take 100 mg by mouth every morning.      amLODipine (NORVASC) 10 MG tablet Take 1 tablet (10 mg total) by mouth at bedtime. 90 tablet 1   aspirin EC 81 MG tablet  Take 1 tablet (81 mg total) by mouth daily. 90 tablet 3   atorvastatin (LIPITOR) 80 MG tablet Take 40 mg by mouth at bedtime.     bisoprolol (ZEBETA) 5 MG tablet TAKE ONE (1) TABLET EACH DAY 90 tablet 3   cloNIDine (CATAPRES) 0.1 MG tablet Take 1 tablet (0.1 mg total) by mouth 3 (three) times daily. 270 tablet 3   hydrALAZINE (APRESOLINE) 50 MG tablet TAKE ONE (1) TABLET THREE (3) TIMES EACH DAY 90 tablet 2   HYDROcodone-acetaminophen (NORCO/VICODIN) 5-325 MG tablet Take 1 tablet by mouth every 4 (four) hours as needed for moderate pain. 6 tablet 0   nitroGLYCERIN (NITROSTAT) 0.4 MG SL tablet Place 1 tablet (0.4 mg total) under the tongue every 5 (five) minutes as needed for chest pain. 25 tablet 11   olmesartan (BENICAR) 40 MG tablet TAKE ONE (1) TABLET EACH DAY 90 tablet 3   omeprazole (PRILOSEC) 20 MG capsule Take 1 capsule (20 mg total) by mouth daily as needed (acid reflux). (Patient taking differently: Take 20 mg by mouth daily.) 90 capsule 1   No current facility-administered medications for this visit.    Past Medical History:  Diagnosis Date   Arthritis    Bladder tumor    CAD in native artery followed by pcp at the [facility] in [place] 11-13-2016  pt denies S&S   per cardiac cath 03-06-2014  by dr Martinique--  moderate non-obstructive cad   Cancer ([place])    Emphysema/COPD ([place])    GERD (gastroesophageal reflux disease)    Gout    per pt currently stable 11-13-2016   History of kidney stones    History of MRSA infection    right hand abscess 09/ 2016   Hypertension    Left inguinal hernia    PAD (peripheral artery disease) ([place]) first dx 1994---  was followed by vascular dr Amedeo Plenty   s/p  fem-pop bypass graft 1994 and 2001/  pt is followed by the [facility] in [facility] 11-13-2016 denies s&s   Rotator cuff syndrome of left shoulder    Wears hearing aid in both ears     Past Surgical History:  Procedure Laterality Date   ANTERIOR CERVICAL DECOMP/DISCECTOMY FUSION  02/1999   AORTA -  BILATERAL FEMORAL ARTERY BYPASS GRAFT  2001  dr Amedeo Plenty   APPENDECTOMY  1959   COLONOSCOPY N/A 02/06/2021   Procedure: COLONOSCOPY;  Surgeon: Rogene Houston, MD;  [facility];  Service: Endoscopy;  Laterality: N/A;  10:30   CYSTOSCOPY W/ RETROGRADES Bilateral 10/31/2019   Procedure: CYSTOSCOPY WITH BILATERAL RETROGRADE PYELOGRAM;  Surgeon: [name], MD;  Location: [facility];  Service: Urology;  Laterality: Bilateral;   CYSTOSCOPY WITH BIOPSY Bilateral 12/02/2021   Procedure: CYSTOSCOPY WITH BLADDER BIOPSY AND FULGURATION BILATERAL  RETROGRADES LEFT URETEROSCOPY  left stent placement;  Surgeon: [name], MD;  Location: [facility];  Service: Urology;  Laterality: Bilateral;  1HR FOR CASE   CYSTOSCOPY WITH STENT PLACEMENT Left 11/26/2016   Procedure: CYSTOSCOPY WITH left double J STENT PLACEMENT;  Surgeon: [name], MD;  Location: [facility];  Service: Urology;  Laterality: Left;   FEMORAL-POPLITEAL BYPASS GRAFT  1994   dr Amedeo Plenty   I & D EXTREMITY Right 01/09/2015   Procedure: IRRIGATION AND DEBRIDEMENT RIGHT THUMB/HAND ;  Surgeon: Roseanne Kaufman, MD;  Location: [facility];  Service: Orthopedics;  Laterality: Right;   LEFT HEART CATHETERIZATION WITH CORONARY ANGIOGRAM N/A 03/06/2014   Procedure: LEFT HEART CATHETERIZATION WITH CORONARY ANGIOGRAM;  Surgeon: Dayjah Selman M Martinique, MD;  Location: [facility] CATH LAB;  Service: Cardiovascular;  Laterality: N/A;  midLAD 50-60%; D1 20-30%; mCFX 30%; pRCA 30%;  ef 55-65%   LUMBAR SPINE SURGERY  1980's   TRANSURETHRAL RESECTION OF BLADDER TUMOR N/A 11/26/2016   Procedure: TRANSURETHRAL RESECTION OF BLADDER TUMOR (TURBT)/ left ureteroscopy;  Surgeon: [name], MD;  Location: [facility];  Service: Urology;  Laterality: N/A;   TRANSURETHRAL RESECTION OF BLADDER TUMOR WITH MITOMYCIN-C N/A 10/31/2019   Procedure: TRANSURETHRAL RESECTION OF BLADDER TUMOR WITH GEMCITABINE;  Surgeon: [name], MD;  Location: [facility];  Service: Urology;   Laterality: N/A;    ROS:As noted in HPI. Otherwise ROS is negative.   Wt Readings from Last 3 Encounters:  12/02/21 179 lb (81.2 kg)  11/24/21 179 lb (81.2 kg)  07/21/21 185 lb 9.6 oz (84.2 kg)    PHYSICAL EXAM There were no vitals taken for this visit. GENERAL:  Well appearing WM in NAD HEENT:  PERRL, EOMI, sclera are clear. Oropharynx is clear. NECK:  No jugular venous distention, carotid upstroke brisk and symmetric, no bruits, no thyromegaly or adenopathy LUNGS:  Clear to auscultation bilaterally CHEST:  Unremarkable HEART:  RRR,  PMI not displaced or sustained,S1 and S2 within normal limits, no S3, no S4: no clicks, no rubs, no murmurs ABD:  Soft, nontender. BS +, no masses or bruits. No hepatomegaly, no splenomegaly EXT:  2 + pulses throughout, no edema, no cyanosis no clubbing SKIN:  Warm and dry.  No rashes NEURO:  Alert and oriented x 3. Cranial nerves II through XII intact. PSYCH:  Cognitively intact   Laboratory data:  Lab Results  Component Value Date   WBC 6.3 11/24/2021   HGB 13.6 11/24/2021   HCT 40.9 11/24/2021   PLT 225 11/24/2021   GLUCOSE 89 11/24/2021   CHOL 138 08/12/2015   TRIG 121 08/12/2015   HDL 30 (L) 08/12/2015   LDLCALC 84 08/12/2015   ALT 16 02/22/2020   AST 23 02/22/2020   NA 139 11/24/2021   K 4.1 11/24/2021   CL 110 11/24/2021   CREATININE 1.61 (H) 11/24/2021   BUN 21 11/24/2021   CO2 23 11/24/2021   TSH 1.040 03/05/2014   INR 1.15 03/05/2014   HGBA1C 6.4 (H) 03/05/2014   Labs from the [facility] 08/08/19: cholesterol 140, triglycerides 173, HDL 34, LDL 71. PSA 1.02, A1c 5.8%. CBC normal. Creatinine 1.6 BUN 19. Otherwise CMET normal.  Dated 06/16/21: cholesterol 131, triglycerides 179, HDL 35, LDL 60. CBC normal. Creatinine 1.64. otherwise CMET normal. TSH normal.  Ecg is done today- NSR rate 56. Normal. I have personally reviewed and interpreted this study.    Lexiscan Myoview 09/01/17: Study Highlights   The left ventricular ejection  fraction is normal (55-65%). Nuclear stress EF: 55%. There was no ST segment deviation noted during stress. The study is normal. This is a low risk study.    ASSESSMENT AND PLAN 1. 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 Last Myoveiw in 2019 was normal.  On good antianginal therapy with high dose amlodipine and bisoprolol. Class 1 symptoms    2. HTN resistant. No RAS. On 5 medications but now with reasonably  good control.   3. Hypercholesterolemia.  On high dose  lipitor. Labs followed at the [facility].LDL at goal 60.  4. PAD s/p bifemoral BPG in 2001. Stable. ABIs in 2019 normal. Good pedal pulses.  5. COPD    6. CKD stage 3b. Now followed by Nephrology.   FU 12  months.

## 2022-02-23 ENCOUNTER — Encounter: Payer: Self-pay | Admitting: Cardiology

## 2022-02-23 ENCOUNTER — Ambulatory Visit: Payer: Non-veteran care | Attending: Cardiology | Admitting: Cardiology

## 2022-02-23 VITALS — BP 142/78 | HR 56 | Ht 70.0 in | Wt 183.4 lb

## 2022-02-23 DIAGNOSIS — E7800 Pure hypercholesterolemia, unspecified: Secondary | ICD-10-CM

## 2022-02-23 DIAGNOSIS — I25118 Atherosclerotic heart disease of native coronary artery with other forms of angina pectoris: Secondary | ICD-10-CM

## 2022-02-23 DIAGNOSIS — I10 Essential (primary) hypertension: Secondary | ICD-10-CM

## 2022-02-23 DIAGNOSIS — I739 Peripheral vascular disease, unspecified: Secondary | ICD-10-CM | POA: Diagnosis not present

## 2022-02-25 ENCOUNTER — Other Ambulatory Visit (HOSPITAL_COMMUNITY): Payer: Self-pay | Admitting: Nephrology

## 2022-02-25 DIAGNOSIS — C679 Malignant neoplasm of bladder, unspecified: Secondary | ICD-10-CM

## 2022-02-25 DIAGNOSIS — I129 Hypertensive chronic kidney disease with stage 1 through stage 4 chronic kidney disease, or unspecified chronic kidney disease: Secondary | ICD-10-CM

## 2022-02-25 DIAGNOSIS — N1832 Chronic kidney disease, stage 3b: Secondary | ICD-10-CM

## 2022-03-02 ENCOUNTER — Ambulatory Visit (HOSPITAL_COMMUNITY)
Admission: RE | Admit: 2022-03-02 | Discharge: 2022-03-02 | Disposition: A | Discharge status: Home / Self Care | Payer: No Typology Code available for payment source | Source: Ambulatory Visit | Attending: Nephrology | Admitting: Nephrology

## 2022-03-02 DIAGNOSIS — C679 Malignant neoplasm of bladder, unspecified: Secondary | ICD-10-CM | POA: Diagnosis present

## 2022-03-02 DIAGNOSIS — N1832 Chronic kidney disease, stage 3b: Secondary | ICD-10-CM | POA: Diagnosis present

## 2022-03-02 DIAGNOSIS — I129 Hypertensive chronic kidney disease with stage 1 through stage 4 chronic kidney disease, or unspecified chronic kidney disease: Secondary | ICD-10-CM | POA: Diagnosis present

## 2022-03-25 ENCOUNTER — Other Ambulatory Visit (HOSPITAL_COMMUNITY): Payer: Self-pay | Admitting: Nephrology

## 2022-03-25 DIAGNOSIS — N281 Cyst of kidney, acquired: Secondary | ICD-10-CM

## 2022-03-25 DIAGNOSIS — N1831 Chronic kidney disease, stage 3a: Secondary | ICD-10-CM

## 2022-03-25 DIAGNOSIS — R778 Other specified abnormalities of plasma proteins: Secondary | ICD-10-CM

## 2022-03-25 DIAGNOSIS — R768 Other specified abnormal immunological findings in serum: Secondary | ICD-10-CM

## 2022-03-25 DIAGNOSIS — I129 Hypertensive chronic kidney disease with stage 1 through stage 4 chronic kidney disease, or unspecified chronic kidney disease: Secondary | ICD-10-CM

## 2022-04-15 ENCOUNTER — Ambulatory Visit (HOSPITAL_COMMUNITY)
Admission: RE | Admit: 2022-04-15 | Discharge: 2022-04-15 | Disposition: A | Discharge status: Home / Self Care | Payer: No Typology Code available for payment source | Source: Ambulatory Visit | Attending: Nephrology | Admitting: Nephrology

## 2022-04-15 DIAGNOSIS — I129 Hypertensive chronic kidney disease with stage 1 through stage 4 chronic kidney disease, or unspecified chronic kidney disease: Secondary | ICD-10-CM

## 2022-04-15 DIAGNOSIS — R7689 Other specified abnormal immunological findings in serum: Secondary | ICD-10-CM

## 2022-04-15 DIAGNOSIS — R768 Other specified abnormal immunological findings in serum: Secondary | ICD-10-CM | POA: Insufficient documentation

## 2022-04-15 DIAGNOSIS — R778 Other specified abnormalities of plasma proteins: Secondary | ICD-10-CM | POA: Diagnosis present

## 2022-04-15 DIAGNOSIS — N281 Cyst of kidney, acquired: Secondary | ICD-10-CM | POA: Diagnosis present

## 2022-04-15 DIAGNOSIS — N1831 Chronic kidney disease, stage 3a: Secondary | ICD-10-CM | POA: Diagnosis not present

## 2022-04-15 MED ORDER — GADOBUTROL 1 MMOL/ML IV SOLN
10.0000 mL | Freq: Once | INTRAVENOUS | Status: AC | PRN
Start: 2022-04-15 — End: 2022-04-15
  Administered 2022-04-15: 10 mL via INTRAVENOUS

## 2022-07-01 ENCOUNTER — Other Ambulatory Visit: Payer: Self-pay | Admitting: Cardiology

## 2022-08-07 ENCOUNTER — Telehealth: Payer: Self-pay | Admitting: Cardiology

## 2022-08-07 ENCOUNTER — Other Ambulatory Visit: Payer: Self-pay | Admitting: Cardiology

## 2022-08-07 NOTE — Telephone Encounter (Signed)
*  STAT* If patient is at the pharmacy, call can be transferred to refill team.   1. Which medications need to be refilled? (please list name of each medication and dose if known) cloNIDine (CATAPRES) 0.1 MG tablet Take 1 tablet (0.1 mg total) by mouth 3 (three) times daily.   2. Which pharmacy/location (including street and city if local pharmacy) is medication to be sent to? [facility],  - [address]    3. Do they need a 30 day or 90 day supply? 90 Day Supply  Patient only has 4 tablets left.

## 2022-08-07 NOTE — Telephone Encounter (Signed)
Patient medication has been refilled and sent to his local pharmacy  

## 2022-11-16 ENCOUNTER — Other Ambulatory Visit: Payer: Self-pay | Admitting: Cardiology

## 2022-11-19 HISTORY — PX: BLADDER SURGERY: SHX569

## 2022-11-24 ENCOUNTER — Ambulatory Visit: Payer: PPO | Admitting: Physician Assistant

## 2023-02-22 ENCOUNTER — Telehealth: Payer: Self-pay | Admitting: *Deleted

## 2023-02-22 NOTE — Progress Notes (Unsigned)
Cardiology Office Note:  .   Date:  02/25/2023  ID:  Manuel Barrett, DOB 01-28-1946, MRN [id number] PCP: [facility] Providers Cardiologist:  Dr. Swaziland     History of Present Illness: .   Manuel Barrett is a 77 y.o. male with CAD non obstructive, 50=60% LAD, PAD s/p bifemoral BPG,HL HTN, COPD, CKD Stage 3B.  He is here for preoperative evaluation to have right reverse shoulder arthroplasty, with Dr. Francena Hanly on date to be determined.   He is without any cardiac complaint.  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  He denies any dyspnea, chest discomfort, palpitations, dizziness, or reduced energy.  He is medically compliant.  Medications come from the [place].  ROS: As above otherwise negative.  Studies Reviewed: .   NM Stress Test 09/01/2017 The left ventricular ejection fraction is normal (55-65%). Nuclear stress EF: 55%. There was no ST segment deviation noted during stress. The study is normal. This is a low risk study.   EKG Interpretation Date/Time:  Thursday February 25 2023 09:45:53 EST Ventricular Rate:  54 PR Interval:  210 QRS Duration:  96 QT Interval:  470 QTC Calculation: 445 R Axis:   57  Text Interpretation: Sinus bradycardia with 1st degree A-V block When compared with ECG of 18-Oct-2019 08:18, No significant change was found Confirmed by Joni Reining [phone]) on 02/25/2023 9:55:30 AM    Physical Exam:   VS:  BP (!) 146/66 (BP Location: Left Arm, Patient Position: Sitting, Cuff Size: Normal)   Pulse (!) 54   Ht 5\' 10"  (1.778 m)   Wt 178 lb 6.4 oz (80.9 kg)   SpO2 96%   BMI 25.60 kg/m    Wt Readings from Last 3 Encounters:  02/25/23 178 lb 6.4 oz (80.9 kg)  02/23/22 183 lb 6.4 oz (83.2 kg)  12/02/21 179 lb (81.2 kg)    GEN: Well nourished, well developed in no acute distress NECK: No JVD; No carotid bruits CARDIAC: RRR, no murmurs, rubs, gallops RESPIRATORY:   Clear to auscultation upper lobes bilaterally, crackles noted in the right base better cleared with cough. ABDOMEN: Soft, non-tender, non-distended EXTREMITIES:  No edema; No deformity   ASSESSMENT AND PLAN: .   Pre-operative Cardiac Clearance.  According to the Revised Cardiac Risk Index (RCRI), his Perioperative Risk of Major Cardiac Event is (%): 0.9  His Functional Capacity in METs is: 9.89 according to the Duke Activity Status Index (DASI).   Per office protocol, if patient is without any new symptoms or concerns at the time of their virtual visit, he/she may hold ASA for 7 days prior to procedure. Please resume ASA as soon as possible postprocedure, at the discretion of the surgeon.      Therefore, based on ACC/AHA guidelines, patient would be at acceptable risk for the planned procedure without further cardiovascular testing. I will route this recommendation to the requesting party via Epic fax function.   2.  Nonobstructive CAD: Patient is completely asymptomatic.  Extremely active.  Denies any anginal symptoms or symptoms worrisome for progression of CAD.  The patient will continue secondary prevention with blood pressure control, statin therapy, purposeful exercise, and low-cholesterol diet.  3.  Hypertension: Elevated today.  Normally elevated during office visits.  He is blood pressure at home is normal.  His son is a Technical brewer and evaluates his blood pressure on occasion.  I will continue him  on amlodipine, clonidine 3 times daily, olmesartan 40 mg daily, and hydralazine.  4.  Hypercholesterolemia: Goal of LDL less than 70.  He brings with him labs from the [place].  On 01/21/2023 total cholesterol 144, HDL 30, LDL 84, triglycerides 136.  May need to consider adding Zetia as he is on maximum dose of atorvastatin.  Signed, Bettey Mare. Liborio Nixon, ANP, AACC

## 2023-02-22 NOTE — Telephone Encounter (Signed)
   Pre-operative Risk Assessment    Patient Name: Manuel Barrett  DOB: 1946-03-03 MRN: [id number]  DATE OF THE LAST VISIT: 02/23/22 DR. PETER Swaziland DATE OF NEXT VISIT: 02/25/23 Joni Reining, DNP    Request for Surgical Clearance    Procedure:   RIGHT REVERSE SHOULDER ARTHROPLASTY  Date of Surgery:  Clearance TBD                                 Surgeon:  DR. Caryn Bee SUPPLE Surgeon's Group or Practice Name:  [facility] Phone number:  [phone] ATTN: Aida Raider Fax number:  [phone]   Type of Clearance Requested:   - Medical  - Pharmacy:  Hold Aspirin     Type of Anesthesia:  General    Additional requests/questions:    [name]   02/22/2023, 11:40 AM

## 2023-02-23 NOTE — Telephone Encounter (Signed)
Pt has appt with Joni Reining, DNP 02/25/23. I will update all parties involved.

## 2023-02-23 NOTE — Telephone Encounter (Signed)
   Name: Manuel Barrett  DOB: Oct 11, 1945  MRN: [id number]  Primary Cardiologist: None  Chart reviewed as part of pre-operative protocol coverage. Because of Manuel Barrett's past medical history and time since last visit, he will require a follow-up in-office visit in order to better assess preoperative cardiovascular risk.  Patient has an office visit scheduled on 02/25/2023 with Joni Reining, NP. Appointment notes have been updated to reflect need for pre-op evaluation.   Pre-op covering staff:  - Please contact requesting surgeon's office via preferred method (i.e, phone, fax) to inform them of need for appointment prior to surgery.   Carlos Levering, NP  02/23/2023, 12:23 PM

## 2023-02-25 ENCOUNTER — Ambulatory Visit: Payer: No Typology Code available for payment source | Attending: Adult Health | Admitting: Adult Health

## 2023-02-25 ENCOUNTER — Encounter: Payer: Self-pay | Admitting: Adult Health

## 2023-02-25 VITALS — BP 146/66 | HR 54 | Ht 70.0 in | Wt 178.4 lb

## 2023-02-25 DIAGNOSIS — I10 Essential (primary) hypertension: Secondary | ICD-10-CM

## 2023-02-25 DIAGNOSIS — I2510 Atherosclerotic heart disease of native coronary artery without angina pectoris: Secondary | ICD-10-CM | POA: Diagnosis not present

## 2023-02-25 DIAGNOSIS — Z01818 Encounter for other preprocedural examination: Secondary | ICD-10-CM | POA: Diagnosis not present

## 2023-02-25 DIAGNOSIS — E7800 Pure hypercholesterolemia, unspecified: Secondary | ICD-10-CM

## 2023-02-25 NOTE — Patient Instructions (Signed)
Medication Instructions:  - No changes  *If you need a refill on your cardiac medications before your next appointment, please call your pharmacy*   Lab Work: - None ordered  Testing/Procedures: - None ordered  Follow-Up: At [facility], you and your health needs are our priority.  As part of our continuing mission to provide you with exceptional heart care, we have created designated Provider Care Teams.  These Care Teams include your primary Cardiologist (physician) and Advanced Practice Providers (APPs -  Physician Assistants and Nurse Practitioners) who all work together to provide you with the care you need, when you need it.  We recommend signing up for the patient portal called "MyChart".  Sign up information is provided on this After Visit Summary.  MyChart is used to connect with patients for Virtual Visits (Telemedicine).  Patients are able to view lab/test results, encounter notes, upcoming appointments, etc.  Non-urgent messages can be sent to your provider as well.   To learn more about what you can do with MyChart, go to [email or web address].    Your next appointment:   1 year(s)  Provider:   Peter Swaziland, MD     Other Instructions Samara Deist will send your procedural clearance today.

## 2023-03-05 NOTE — Patient Instructions (Signed)
DUE TO COVID-19 ONLY TWO VISITORS  (aged 75 and older)  ARE ALLOWED TO COME WITH YOU AND STAY IN THE WAITING ROOM ONLY DURING PRE OP AND PROCEDURE.   **NO VISITORS ARE ALLOWED IN THE SHORT STAY AREA OR RECOVERY ROOM!!**  IF YOU WILL BE ADMITTED INTO THE HOSPITAL YOU ARE ALLOWED ONLY FOUR SUPPORT PEOPLE DURING VISITATION HOURS ONLY (7 AM -8PM)   The support person(s) must pass our screening, gel in and out, and wear a mask at all times, including in the patient's room. Patients must also wear a mask when staff or their support person are in the room. Visitors GUEST BADGE MUST BE WORN VISIBLY  One adult visitor may remain with you overnight and MUST be in the room by 8 P.M.     Your procedure is scheduled on: 03/11/23   Report to [facility] Main Entrance    Report to admitting at : 7:30 AM   Call this number if you have problems the morning of surgery [phone]   Do not eat food :After Midnight.   After Midnight you may have the following liquids until : 7:00 AM DAY OF SURGERY  Water Black Coffee (sugar ok, NO MILK/CREAM OR CREAMERS)  Tea (sugar ok, NO MILK/CREAM OR CREAMERS) regular and decaf                             Plain Jell-O (NO RED)                                           Fruit ices (not with fruit pulp, NO RED)                                     Popsicles (NO RED)                                                                  Juice: apple, WHITE grape, WHITE cranberry Sports drinks like Gatorade (NO RED)   The day of surgery:  Drink ONE (1) Pre-Surgery Clear Ensure at : 7:00 AM the morning of surgery. Drink in one sitting. Do not sip.  This drink was given to you during your hospital  pre-op appointment visit. Nothing else to drink after completing the  Pre-Surgery Clear Ensure or G2.          If you have questions, please contact your surgeon's office.  FOLLOW ANY ADDITIONAL PRE OP INSTRUCTIONS YOU RECEIVED FROM YOUR SURGEON'S OFFICE!!!   Oral  Hygiene is also important to reduce your risk of infection.                                    Remember - BRUSH YOUR TEETH THE MORNING OF SURGERY WITH YOUR REGULAR TOOTHPASTE  DENTURES WILL BE REMOVED PRIOR TO SURGERY PLEASE DO NOT APPLY "Poly grip" OR ADHESIVES!!!   Do NOT smoke after Midnight   Take these medicines the morning of surgery  with A SIP OF WATER: clonidine,hydralazine,bisoprolol,amlodipine,allopurinol,omeprazole.                              You may not have any metal on your body including hair pins, jewelry, and body piercing             Do not wear lotions, powders, perfumes/cologne, or deodorant              Men may shave face and neck.   Do not bring valuables to the hospital. [facility] IS NOT             RESPONSIBLE   FOR VALUABLES.   Contacts, glasses, or bridgework may not be worn into surgery.   Bring small overnight bag day of surgery.   DO NOT BRING YOUR HOME MEDICATIONS TO THE HOSPITAL. PHARMACY WILL DISPENSE MEDICATIONS LISTED ON YOUR MEDICATION LIST TO YOU DURING YOUR ADMISSION IN THE HOSPITAL!    Patients discharged on the day of surgery will not be allowed to drive home.  Someone NEEDS to stay with you for the first 24 hours after anesthesia.   Special Instructions: Bring a copy of your healthcare power of attorney and living will documents         the day of surgery if you haven't scanned them before.              Please read over the following fact sheets you were given: IF YOU HAVE QUESTIONS ABOUT YOUR PRE-OP INSTRUCTIONS PLEASE CALL [phone] [facility]- Preparing for Total Shoulder Arthroplasty    Before surgery, you can play an important role. Because skin is not sterile, your skin needs to be as free of germs as possible. You can reduce the number of germs on your skin by using the following products. Benzoyl Peroxide Gel Reduces the number of germs present on the skin Applied twice a day to shoulder area starting two days before surgery     ==================================================================  Please follow these instructions carefully:  BENZOYL PEROXIDE 5% GEL  Please do not use if you have an allergy to benzoyl peroxide.   If your skin becomes reddened/irritated stop using the benzoyl peroxide.  Starting two days before surgery, apply as follows: Apply benzoyl peroxide in the morning and at night. Apply after taking a shower. If you are not taking a shower clean entire shoulder front, back, and side along with the armpit with a clean wet washcloth.  Place a quarter-sized dollop on your shoulder and rub in thoroughly, making sure to cover the front, back, and side of your shoulder, along with the armpit.   2 days before ____ AM   ____ PM              1 day before ____ AM   ____ PM                         Do this twice a day for two days.  (Last application is the night before surgery, AFTER using the CHG soap as described below).  Do NOT apply benzoyl peroxide gel on the day of surgery.     Pre-operative 5 CHG Bath Instructions   You can play a key role in reducing the risk of infection after surgery. Your skin needs to be as free of germs as possible. You can reduce the number of germs on your skin by washing with CHG (  chlorhexidine gluconate) soap before surgery. CHG is an antiseptic soap that kills germs and continues to kill germs even after washing.   DO NOT use if you have an allergy to chlorhexidine/CHG or antibacterial soaps. If your skin becomes reddened or irritated, stop using the CHG and notify one of our RNs at : [phone].   Please shower with the CHG soap starting 4 days before surgery using the following schedule:     Please keep in mind the following:  DO NOT shave, including legs and underarms, starting the day of your first shower.   You may shave your face at any point before/day of surgery.  Place clean sheets on your bed the day you start using CHG soap. Use a clean washcloth  (not used since being washed) for each shower. DO NOT sleep with pets once you start using the CHG.   CHG Shower Instructions:  If you choose to wash your hair and private area, wash first with your normal shampoo/soap.  After you use shampoo/soap, rinse your hair and body thoroughly to remove shampoo/soap residue.  Turn the water OFF and apply about 3 tablespoons (45 ml) of CHG soap to a CLEAN washcloth.  Apply CHG soap ONLY FROM YOUR NECK DOWN TO YOUR TOES (washing for 3-5 minutes)  DO NOT use CHG soap on face, private areas, open wounds, or sores.  Pay special attention to the area where your surgery is being performed.  If you are having back surgery, having someone wash your back for you may be helpful. Wait 2 minutes after CHG soap is applied, then you may rinse off the CHG soap.  Pat dry with a clean towel  Put on clean clothes/pajamas   If you choose to wear lotion, please use ONLY the CHG-compatible lotions on the back of this paper.     Additional instructions for the day of surgery: DO NOT APPLY any lotions, deodorants, cologne, or perfumes.   Put on clean/comfortable clothes.  Brush your teeth.  Ask your nurse before applying any prescription medications to the skin.   CHG Compatible Lotions   Aveeno Moisturizing lotion  Cetaphil Moisturizing Cream  Cetaphil Moisturizing Lotion  Clairol Herbal Essence Moisturizing Lotion, Dry Skin  Clairol Herbal Essence Moisturizing Lotion, Extra Dry Skin  Clairol Herbal Essence Moisturizing Lotion, Normal Skin  Curel Age Defying Therapeutic Moisturizing Lotion with Alpha Hydroxy  Curel Extreme Care Body Lotion  Curel Soothing Hands Moisturizing Hand Lotion  Curel Therapeutic Moisturizing Cream, Fragrance-Free  Curel Therapeutic Moisturizing Lotion, Fragrance-Free  Curel Therapeutic Moisturizing Lotion, Original Formula  Eucerin Daily Replenishing Lotion  Eucerin Dry Skin Therapy Plus Alpha Hydroxy Crme  Eucerin Dry Skin Therapy  Plus Alpha Hydroxy Lotion  Eucerin Original Crme  Eucerin Original Lotion  Eucerin Plus Crme Eucerin Plus Lotion  Eucerin TriLipid Replenishing Lotion  Keri Anti-Bacterial Hand Lotion  Keri Deep Conditioning Original Lotion Dry Skin Formula Softly Scented  Keri Deep Conditioning Original Lotion, Fragrance Free Sensitive Skin Formula  Keri Lotion Fast Absorbing Fragrance Free Sensitive Skin Formula  Keri Lotion Fast Absorbing Softly Scented Dry Skin Formula  Keri Original Lotion  Keri Skin Renewal Lotion Keri Silky Smooth Lotion  Keri Silky Smooth Sensitive Skin Lotion  Nivea Body Creamy Conditioning Oil  Nivea Body Extra Enriched Teacher, adult education Moisturizing Lotion Nivea Crme  Nivea Skin Firming Lotion  NutraDerm 30 Skin Lotion  NutraDerm Skin Lotion  NutraDerm Therapeutic Skin Cream  NutraDerm Therapeutic  Skin Lotion  ProShield Protective Hand Cream  Provon moisturizing lotion   Incentive Spirometer  An incentive spirometer is a tool that can help keep your lungs clear and active. This tool measures how well you are filling your lungs with each breath. Taking long deep breaths may help reverse or decrease the chance of developing breathing (pulmonary) problems (especially infection) following: A long period of time when you are unable to move or be active. BEFORE THE PROCEDURE  If the spirometer includes an indicator to show your best effort, your nurse or respiratory therapist will set it to a desired goal. If possible, sit up straight or lean slightly forward. Try not to slouch. Hold the incentive spirometer in an upright position. INSTRUCTIONS FOR USE  Sit on the edge of your bed if possible, or sit up as far as you can in bed or on a chair. Hold the incentive spirometer in an upright position. Breathe out normally. Place the mouthpiece in your mouth and seal your lips tightly around it. Breathe in slowly and as deeply as possible,  raising the piston or the ball toward the top of the column. Hold your breath for 3-5 seconds or for as long as possible. Allow the piston or ball to fall to the bottom of the column. Remove the mouthpiece from your mouth and breathe out normally. Rest for a few seconds and repeat Steps 1 through 7 at least 10 times every 1-2 hours when you are awake. Take your time and take a few normal breaths between deep breaths. The spirometer may include an indicator to show your best effort. Use the indicator as a goal to work toward during each repetition. After each set of 10 deep breaths, practice coughing to be sure your lungs are clear. If you have an incision (the cut made at the time of surgery), support your incision when coughing by placing a pillow or rolled up towels firmly against it. Once you are able to get out of bed, walk around indoors and cough well. You may stop using the incentive spirometer when instructed by your caregiver.  RISKS AND COMPLICATIONS Take your time so you do not get dizzy or light-headed. If you are in pain, you may need to take or ask for pain medication before doing incentive spirometry. It is harder to take a deep breath if you are having pain. AFTER USE Rest and breathe slowly and easily. It can be helpful to keep track of a log of your progress. Your caregiver can provide you with a simple table to help with this. If you are using the spirometer at home, follow these instructions: SEEK MEDICAL CARE IF:  You are having difficultly using the spirometer. You have trouble using the spirometer as often as instructed. Your pain medication is not giving enough relief while using the spirometer. You develop fever of 100.5 F (38.1 C) or higher. SEEK IMMEDIATE MEDICAL CARE IF:  You cough up bloody sputum that had not been present before. You develop fever of 102 F (38.9 C) or greater. You develop worsening pain at or near the incision site. MAKE SURE YOU:  Understand  these instructions. Will watch your condition. Will get help right away if you are not doing well or get worse. Document Released: 08/17/2006 Document Revised: 06/29/2011 Document Reviewed: 10/18/2006 [facility] Patient Information 2014 [place], [place].   ________________________________________________________________________

## 2023-03-09 ENCOUNTER — Encounter (HOSPITAL_COMMUNITY)
Admission: RE | Admit: 2023-03-09 | Discharge: 2023-03-09 | Disposition: A | Discharge status: Home / Self Care | Payer: No Typology Code available for payment source | Source: Ambulatory Visit | Attending: Orthopedic Surgery | Admitting: Orthopedic Surgery

## 2023-03-09 ENCOUNTER — Other Ambulatory Visit: Payer: Self-pay

## 2023-03-09 ENCOUNTER — Encounter (HOSPITAL_COMMUNITY): Payer: Self-pay

## 2023-03-09 VITALS — BP 163/64 | HR 55 | Temp 97.5°F | Ht 70.0 in | Wt 178.0 lb

## 2023-03-09 DIAGNOSIS — J449 Chronic obstructive pulmonary disease, unspecified: Secondary | ICD-10-CM | POA: Insufficient documentation

## 2023-03-09 DIAGNOSIS — I129 Hypertensive chronic kidney disease with stage 1 through stage 4 chronic kidney disease, or unspecified chronic kidney disease: Secondary | ICD-10-CM | POA: Diagnosis not present

## 2023-03-09 DIAGNOSIS — Z01812 Encounter for preprocedural laboratory examination: Secondary | ICD-10-CM | POA: Diagnosis present

## 2023-03-09 DIAGNOSIS — Z87891 Personal history of nicotine dependence: Secondary | ICD-10-CM | POA: Diagnosis not present

## 2023-03-09 DIAGNOSIS — M75101 Unspecified rotator cuff tear or rupture of right shoulder, not specified as traumatic: Secondary | ICD-10-CM | POA: Insufficient documentation

## 2023-03-09 DIAGNOSIS — I2510 Atherosclerotic heart disease of native coronary artery without angina pectoris: Secondary | ICD-10-CM | POA: Insufficient documentation

## 2023-03-09 DIAGNOSIS — I10 Essential (primary) hypertension: Secondary | ICD-10-CM

## 2023-03-09 DIAGNOSIS — Z01818 Encounter for other preprocedural examination: Secondary | ICD-10-CM

## 2023-03-09 HISTORY — DX: Chronic kidney disease, unspecified: N18.9

## 2023-03-09 LAB — CBC
HCT: 41.8 % (ref 39.0–52.0)
Hemoglobin: 13.8 g/dL (ref 13.0–17.0)
MCH: 30 pg (ref 26.0–34.0)
MCHC: 33 g/dL (ref 30.0–36.0)
MCV: 90.9 fL (ref 80.0–100.0)
Platelets: 208 10*3/uL (ref 150–400)
RBC: 4.6 MIL/uL (ref 4.22–5.81)
RDW: 12.8 % (ref 11.5–15.5)
WBC: 5.9 10*3/uL (ref 4.0–10.5)
nRBC: 0 % (ref 0.0–0.2)

## 2023-03-09 LAB — SURGICAL PCR SCREEN
MRSA, PCR: NEGATIVE
Staphylococcus aureus: NEGATIVE

## 2023-03-09 LAB — BASIC METABOLIC PANEL
Anion gap: 9 (ref 5–15)
BUN: 21 mg/dL (ref 8–23)
CO2: 22 mmol/L (ref 22–32)
Calcium: 9.2 mg/dL (ref 8.9–10.3)
Chloride: 109 mmol/L (ref 98–111)
Creatinine, Ser: 1.64 mg/dL — ABNORMAL HIGH (ref 0.61–1.24)
GFR, Estimated: 44 mL/min — ABNORMAL LOW (ref >60)
Glucose, Bld: 113 mg/dL — ABNORMAL HIGH (ref 70–99)
Potassium: 3.8 mmol/L (ref 3.5–5.1)
Sodium: 140 mmol/L (ref 135–145)

## 2023-03-09 NOTE — Anesthesia Preprocedure Evaluation (Signed)
Anesthesia Evaluation  Patient identified by MRN, date of birth, ID band Patient awake    Reviewed: Allergy & Precautions, NPO status , Patient's Chart, lab work & pertinent test results  Airway Mallampati: II  TM Distance: >3 FB Neck ROM: Full    Dental  (+) Teeth Intact, Dental Advisory Given   Pulmonary COPD, former smoker   breath sounds clear to auscultation       Cardiovascular hypertension, + CAD and + Peripheral Vascular Disease   Rhythm:Regular Rate:Normal     Neuro/Psych negative neurological ROS  negative psych ROS   GI/Hepatic Neg liver ROS,GERD  ,,  Endo/Other  negative endocrine ROS    Renal/GU Renal disease     Musculoskeletal  (+) Arthritis ,    Abdominal   Peds  Hematology negative hematology ROS (+)   Anesthesia Other Findings   Reproductive/Obstetrics                             Anesthesia Physical Anesthesia Plan  ASA: 3  Anesthesia Plan: General   Post-op Pain Management: Regional block*   Induction: Intravenous  PONV Risk Score and Plan: 3 and Ondansetron, Treatment may vary due to age or medical condition and Dexamethasone  Airway Management Planned: Oral ETT  Additional Equipment: None  Intra-op Plan:   Post-operative Plan: Extubation in OR  Informed Consent: I have reviewed the patients History and Physical, chart, labs and discussed the procedure including the risks, benefits and alternatives for the proposed anesthesia with the patient or authorized representative who has indicated his/her understanding and acceptance.     Dental advisory given  Plan Discussed with: CRNA  Anesthesia Plan Comments: (See PAT note 03/09/2023)       Anesthesia Quick Evaluation

## 2023-03-09 NOTE — Progress Notes (Signed)
Lab. Results: Creatinine: 1.64

## 2023-03-09 NOTE — Progress Notes (Signed)
Anesthesia Chart Review   Case: [id number] Date/Time: 03/11/23 0946   Procedure: REVERSE SHOULDER ARTHROPLASTY (Right: Shoulder) -   Anesthesia type: General   Pre-op diagnosis: Right shoulder rotator cuff tear arthropathy   Location: [facility] ROOM 06 / [facility]   Surgeons: Francena Hanly, MD       DISCUSSION:77 y.o. former smoker with h/o HTN, CKD Stage III, nonobstructive CAD, COPD, right shoulder OA scheduled for above procedure 03/11/23 with Dr. Francena Hanly.   Pt last seen by cardio 02/25/2023. Per OV note, "According to the Revised Cardiac Risk Index (RCRI), his Perioperative Risk of Major Cardiac Event is (%): 0.9   His Functional Capacity in METs is: 9.89 according to the [facility] Activity Status Index (DASI).    Per office protocol, if patient is without any new symptoms or concerns at the time of their virtual visit, he/she may hold ASA for 7 days prior to procedure. Please resume ASA as soon as possible postprocedure, at the discretion of the surgeon.      Therefore, based on ACC/AHA guidelines, patient would be at acceptable risk for the planned procedure without further cardiovascular testing. I will route this recommendation to the requesting party via Epic fax function. "   VS: BP (!) 163/64   Pulse (!) 55   Temp (!) 36.4 C (Oral)   Ht 5\' 10"  (1.778 m)   Wt 80.7 kg   SpO2 97%   BMI 25.54 kg/m   PROVIDERS: [facility]  Cardiologist:  Dr. Swaziland  LABS: Labs reviewed: Acceptable for surgery. (all labs ordered are listed, but only abnormal results are displayed)  Labs Reviewed  BASIC METABOLIC PANEL - Abnormal; Notable for the following components:      Result Value   Glucose, Bld 113 (*)    Creatinine, Ser 1.64 (*)    GFR, Estimated 44 (*)    All other components within normal limits  SURGICAL PCR SCREEN  CBC     IMAGES:   EKG:   CV: Myocardial Perfusion 09/01/2017 The left ventricular ejection fraction is normal (55-65%). Nuclear stress EF:  55%. There was no ST segment deviation noted during stress. The study is normal. This is a low risk study.   Past Medical History:  Diagnosis Date   Arthritis    Bladder tumor    CAD in native artery followed by pcp at the [place] in [place] 11-13-2016 pt denies S&S   per cardiac cath 03-06-2014  by dr Swaziland--  moderate non-obstructive cad   Cancer [facility])    bladder   Chronic kidney disease    Emphysema/COPD (HCC)    GERD (gastroesophageal reflux disease)    Gout    per pt currently stable 11-13-2016   History of kidney stones    History of MRSA infection    right hand abscess 09/ 2016   Hypertension    Left inguinal hernia    PAD (peripheral artery disease) (HCC) first dx 1994---  was followed by vascular dr Madilyn Fireman   s/p  fem-pop bypass graft 1994 and 2001/  [facility] [place] 11[facility]13-2016 denies s&s   Rotator cuff syndrome of left shoulder    Wears hearing aid in both ears     Past Surgical History:  Procedure Laterality Date   ANTERIOR CERVICAL DECOMP/DISCECTOMY FUSION  02/1999   AORTA - BILATERAL FEMORAL ARTERY BYPASS GRAFT  2001  dr Madilyn Fireman   APPENDECTOMY  1959   BLADDER SURGERY  11/2022  COLONOSCOPY N/A 02/06/2021   Procedure: COLONOSCOPY;  Surgeon: Malissa Hippo, MD;  [facility];  Service: Endoscopy;  Laterality: N/A;  10:30   CYSTOSCOPY W/ RETROGRADES Bilateral 10/31/2019   Procedure: CYSTOSCOPY WITH BILATERAL RETROGRADE PYELOGRAM;  Surgeon: Bjorn Pippin, MD;  Location: [facility];  Service: Urology;  Laterality: Bilateral;   CYSTOSCOPY WITH BIOPSY Bilateral 12/02/2021   Procedure: CYSTOSCOPY WITH BLADDER BIOPSY AND FULGURATION BILATERAL  RETROGRADES LEFT URETEROSCOPY  left stent placement;  Surgeon: Bjorn Pippin, MD;  Location: [facility];  Service: Urology;  Laterality: Bilateral;  1HR FOR CASE   CYSTOSCOPY WITH STENT PLACEMENT Left 11/26/2016   Procedure: CYSTOSCOPY WITH left double J STENT PLACEMENT;  Surgeon: Bjorn Pippin, MD;  Location:  [facility];  Service: Urology;  Laterality: Left;   FEMORAL-POPLITEAL BYPASS GRAFT  1994   dr Madilyn Fireman   I & D EXTREMITY Right 01/09/2015   Procedure: IRRIGATION AND DEBRIDEMENT RIGHT THUMB/HAND ;  Surgeon: Dominica Severin, MD;  Location: [facility] OR;  Service: Orthopedics;  Laterality: Right;   LEFT HEART CATHETERIZATION WITH CORONARY ANGIOGRAM N/A 03/06/2014   Procedure: LEFT HEART CATHETERIZATION WITH CORONARY ANGIOGRAM;  Surgeon: Peter M Swaziland, MD;  Location: [facility] CATH LAB;  Service: Cardiovascular;  Laterality: N/A;  midLAD 50-60%; D1 20-30%; mCFX 30%; pRCA 30%;  ef 55-65%   LUMBAR SPINE SURGERY  1980's   TRANSURETHRAL RESECTION OF BLADDER TUMOR N/A 11/26/2016   Procedure: TRANSURETHRAL RESECTION OF BLADDER TUMOR (TURBT)/ left ureteroscopy;  Surgeon: Bjorn Pippin, MD;  Location: [facility];  Service: Urology;  Laterality: N/A;   TRANSURETHRAL RESECTION OF BLADDER TUMOR WITH MITOMYCIN-C N/A 10/31/2019   Procedure: TRANSURETHRAL RESECTION OF BLADDER TUMOR WITH GEMCITABINE;  Surgeon: Bjorn Pippin, MD;  Location: [facility];  Service: Urology;  Laterality: N/A;    MEDICATIONS:  acetaminophen (TYLENOL) 500 MG tablet   allopurinol (ZYLOPRIM) 100 MG tablet   amLODipine (NORVASC) 10 MG tablet   aspirin EC 81 MG tablet   atorvastatin (LIPITOR) 80 MG tablet   bisoprolol (ZEBETA) 5 MG tablet   cloNIDine (CATAPRES) 0.1 MG tablet   EPINEPHrine 0.3 mg/0.3 mL IJ SOAJ injection   hydrALAZINE (APRESOLINE) 50 MG tablet   nitroGLYCERIN (NITROSTAT) 0.4 MG SL tablet   olmesartan (BENICAR) 40 MG tablet   omeprazole (PRILOSEC) 20 MG capsule   No current facility-administered medications for this encounter.    Jodell Cipro Ward, PA-C [facility] Pre-Surgical Testing [phone]

## 2023-03-09 NOTE — Progress Notes (Signed)
For Anesthesia: PCP - [facility]  Cardiologist - Swaziland, Peter M, MD  Clearance: Joni Reining: NP: 02/25/23 Bowel Prep reminder:  Chest x-ray -  EKG - 02/25/23 Stress Test - 09/01/17 ECHO -  Cardiac Cath -  Pacemaker/ICD device last checked: Pacemaker orders received: Device Rep notified:  Spinal Cord Stimulator: N/A  Sleep Study - N/A CPAP -   Fasting Blood Sugar - N/A Checks Blood Sugar _____ times a day Date and result of last Hgb A1c-  Last dose of GLP1 agonist-  GLP1 instructions:   Last dose of SGLT-2 inhibitors-  SGLT-2 instructions:   Blood Thinner Instructions: Aspirin Instructions: Last Dose:  Activity level: Can go up a flight of stairs and activities of daily living without stopping and without chest pain and/or shortness of breath   Able to exercise without chest pain and/or shortness of breath  Anesthesia review: HX: COPD,Emphysema,PAD,HTN,CAD   Patient denies shortness of breath, fever, cough and chest pain at PAT appointment   Patient verbalized understanding of instructions that were given to them at the PAT appointment. Patient was also instructed that they will need to review over the PAT instructions again at home before surgery.

## 2023-03-11 ENCOUNTER — Ambulatory Visit (HOSPITAL_COMMUNITY)
Admission: RE | Admit: 2023-03-11 | Discharge: 2023-03-11 | Disposition: A | Discharge status: Home / Self Care | Payer: No Typology Code available for payment source | Attending: Orthopedic Surgery | Admitting: Orthopedic Surgery

## 2023-03-11 ENCOUNTER — Other Ambulatory Visit: Payer: Self-pay

## 2023-03-11 ENCOUNTER — Ambulatory Visit (HOSPITAL_COMMUNITY): Payer: No Typology Code available for payment source | Admitting: Physician Assistant

## 2023-03-11 ENCOUNTER — Encounter (HOSPITAL_COMMUNITY): Payer: Self-pay | Admitting: Orthopedic Surgery

## 2023-03-11 ENCOUNTER — Encounter (HOSPITAL_COMMUNITY)
Admission: RE | Disposition: A | Discharge status: Home / Self Care | Payer: Self-pay | Source: Home / Self Care | Attending: Orthopedic Surgery

## 2023-03-11 ENCOUNTER — Ambulatory Visit (HOSPITAL_COMMUNITY): Payer: No Typology Code available for payment source | Admitting: Anesthesiology

## 2023-03-11 DIAGNOSIS — M75101 Unspecified rotator cuff tear or rupture of right shoulder, not specified as traumatic: Secondary | ICD-10-CM | POA: Insufficient documentation

## 2023-03-11 DIAGNOSIS — I129 Hypertensive chronic kidney disease with stage 1 through stage 4 chronic kidney disease, or unspecified chronic kidney disease: Secondary | ICD-10-CM | POA: Diagnosis not present

## 2023-03-11 DIAGNOSIS — M12811 Other specific arthropathies, not elsewhere classified, right shoulder: Secondary | ICD-10-CM | POA: Diagnosis not present

## 2023-03-11 DIAGNOSIS — N189 Chronic kidney disease, unspecified: Secondary | ICD-10-CM | POA: Diagnosis not present

## 2023-03-11 DIAGNOSIS — M19011 Primary osteoarthritis, right shoulder: Secondary | ICD-10-CM | POA: Diagnosis not present

## 2023-03-11 DIAGNOSIS — M25711 Osteophyte, right shoulder: Secondary | ICD-10-CM | POA: Insufficient documentation

## 2023-03-11 DIAGNOSIS — J439 Emphysema, unspecified: Secondary | ICD-10-CM | POA: Diagnosis not present

## 2023-03-11 DIAGNOSIS — I2510 Atherosclerotic heart disease of native coronary artery without angina pectoris: Secondary | ICD-10-CM | POA: Insufficient documentation

## 2023-03-11 DIAGNOSIS — I739 Peripheral vascular disease, unspecified: Secondary | ICD-10-CM | POA: Insufficient documentation

## 2023-03-11 DIAGNOSIS — Z87891 Personal history of nicotine dependence: Secondary | ICD-10-CM | POA: Diagnosis not present

## 2023-03-11 HISTORY — PX: REVERSE SHOULDER ARTHROPLASTY: SHX5054

## 2023-03-11 SURGERY — ARTHROPLASTY, SHOULDER, TOTAL, REVERSE
Anesthesia: General | Site: Shoulder | Laterality: Right

## 2023-03-11 MED ORDER — ONDANSETRON HCL 4 MG PO TABS: 4.0000 mg | ORAL_TABLET | Freq: Three times a day (TID) | ORAL | 0 refills | Status: DC | PRN

## 2023-03-11 MED ORDER — BUPIVACAINE LIPOSOME 1.3 % IJ SUSP
INTRAMUSCULAR | Status: DC | PRN
  Administered 2023-03-11: 10 mL

## 2023-03-11 MED ORDER — ACETAMINOPHEN 325 MG PO TABS
ORAL_TABLET | ORAL | Status: AC
  Filled 2023-03-11: qty 2

## 2023-03-11 MED ORDER — ROCURONIUM BROMIDE 100 MG/10ML IV SOLN
INTRAVENOUS | Status: DC | PRN
  Administered 2023-03-11: 50 mg via INTRAVENOUS

## 2023-03-11 MED ORDER — STERILE WATER FOR IRRIGATION IR SOLN
Status: DC | PRN
  Administered 2023-03-11: 1000 mL

## 2023-03-11 MED ORDER — FENTANYL CITRATE (PF) 100 MCG/2ML IJ SOLN
INTRAMUSCULAR | Status: AC
  Filled 2023-03-11: qty 2

## 2023-03-11 MED ORDER — DEXAMETHASONE SODIUM PHOSPHATE 10 MG/ML IJ SOLN
INTRAMUSCULAR | Status: DC | PRN
  Administered 2023-03-11: 10 mg via INTRAVENOUS

## 2023-03-11 MED ORDER — VANCOMYCIN HCL 1000 MG IV SOLR
INTRAVENOUS | Status: DC | PRN
  Administered 2023-03-11: 1000 mg via TOPICAL

## 2023-03-11 MED ORDER — LIDOCAINE HCL (CARDIAC) PF 100 MG/5ML IV SOSY
PREFILLED_SYRINGE | INTRAVENOUS | Status: DC | PRN
  Administered 2023-03-11: 40 mg via INTRAVENOUS

## 2023-03-11 MED ORDER — CHLORHEXIDINE GLUCONATE 0.12 % MT SOLN: 15.0000 mL | Freq: Once | OROMUCOSAL | Status: DC

## 2023-03-11 MED ORDER — TRANEXAMIC ACID-NACL 1000-0.7 MG/100ML-% IV SOLN
1000.0000 mg | INTRAVENOUS | Status: AC
  Administered 2023-03-11: 1000 mg via INTRAVENOUS
  Filled 2023-03-11: qty 100

## 2023-03-11 MED ORDER — DROPERIDOL 2.5 MG/ML IJ SOLN
0.6250 mg | Freq: Once | INTRAMUSCULAR | Status: DC | PRN
Start: 2023-03-11 — End: 2023-03-11

## 2023-03-11 MED ORDER — ORAL CARE MOUTH RINSE: 15.0000 mL | Freq: Once | OROMUCOSAL | Status: DC

## 2023-03-11 MED ORDER — ONDANSETRON HCL 4 MG/2ML IJ SOLN
INTRAMUSCULAR | Status: AC
  Filled 2023-03-11: qty 2

## 2023-03-11 MED ORDER — MIDAZOLAM HCL 2 MG/2ML IJ SOLN
INTRAMUSCULAR | Status: AC
  Administered 2023-03-11: 1 mg via INTRAVENOUS
  Filled 2023-03-11: qty 2

## 2023-03-11 MED ORDER — VANCOMYCIN HCL 1000 MG IV SOLR
INTRAVENOUS | Status: AC
  Filled 2023-03-11: qty 20

## 2023-03-11 MED ORDER — ACETAMINOPHEN 160 MG/5ML PO SOLN: 325.0000 mg | Freq: Once | ORAL | Status: AC | PRN

## 2023-03-11 MED ORDER — SUGAMMADEX SODIUM 200 MG/2ML IV SOLN
INTRAVENOUS | Status: DC | PRN
  Administered 2023-03-11: 200 mg via INTRAVENOUS

## 2023-03-11 MED ORDER — HYDROMORPHONE HCL 1 MG/ML IJ SOLN
INTRAMUSCULAR | Status: AC
  Filled 2023-03-11: qty 1

## 2023-03-11 MED ORDER — OXYCODONE-ACETAMINOPHEN 5-325 MG PO TABS: 1.0000 | ORAL_TABLET | ORAL | 0 refills | Status: DC | PRN

## 2023-03-11 MED ORDER — 0.9 % SODIUM CHLORIDE (POUR BTL) OPTIME
TOPICAL | Status: DC | PRN
  Administered 2023-03-11: 1000 mL

## 2023-03-11 MED ORDER — FENTANYL CITRATE PF 50 MCG/ML IJ SOSY: 50.0000 ug | PREFILLED_SYRINGE | Freq: Once | INTRAMUSCULAR | Status: AC

## 2023-03-11 MED ORDER — DEXAMETHASONE SODIUM PHOSPHATE 10 MG/ML IJ SOLN
INTRAMUSCULAR | Status: AC
  Filled 2023-03-11: qty 1

## 2023-03-11 MED ORDER — HYDROMORPHONE HCL 1 MG/ML IJ SOLN
0.2500 mg | INTRAMUSCULAR | Status: DC | PRN
  Administered 2023-03-11 (×3): 0.5 mg via INTRAVENOUS

## 2023-03-11 MED ORDER — ONDANSETRON HCL 4 MG/2ML IJ SOLN
INTRAMUSCULAR | Status: DC | PRN
  Administered 2023-03-11: 4 mg via INTRAVENOUS

## 2023-03-11 MED ORDER — FENTANYL CITRATE PF 50 MCG/ML IJ SOSY
PREFILLED_SYRINGE | INTRAMUSCULAR | Status: AC
  Administered 2023-03-11: 25 ug via INTRAVENOUS
  Filled 2023-03-11: qty 2

## 2023-03-11 MED ORDER — PHENYLEPHRINE HCL-NACL 20-0.9 MG/250ML-% IV SOLN
INTRAVENOUS | Status: DC | PRN
  Administered 2023-03-11: 40 ug/min via INTRAVENOUS

## 2023-03-11 MED ORDER — PROPOFOL 10 MG/ML IV BOLUS
INTRAVENOUS | Status: AC
  Filled 2023-03-11: qty 20

## 2023-03-11 MED ORDER — ACETAMINOPHEN 325 MG PO TABS
325.0000 mg | ORAL_TABLET | Freq: Once | ORAL | Status: AC | PRN
  Administered 2023-03-11: 650 mg via ORAL

## 2023-03-11 MED ORDER — PROPOFOL 10 MG/ML IV BOLUS
INTRAVENOUS | Status: DC | PRN
  Administered 2023-03-11: 100 mg via INTRAVENOUS

## 2023-03-11 MED ORDER — CYCLOBENZAPRINE HCL 10 MG PO TABS: 10.0000 mg | ORAL_TABLET | Freq: Three times a day (TID) | ORAL | 1 refills | Status: DC | PRN

## 2023-03-11 MED ORDER — LACTATED RINGERS IV SOLN: INTRAVENOUS | Status: DC

## 2023-03-11 MED ORDER — FENTANYL CITRATE (PF) 100 MCG/2ML IJ SOLN
INTRAMUSCULAR | Status: DC | PRN
  Administered 2023-03-11: 50 ug via INTRAVENOUS

## 2023-03-11 MED ORDER — BUPIVACAINE HCL (PF) 0.5 % IJ SOLN
INTRAMUSCULAR | Status: DC | PRN
  Administered 2023-03-11: 12 mL via PERINEURAL

## 2023-03-11 MED ORDER — MIDAZOLAM HCL 2 MG/2ML IJ SOLN: 1.0000 mg | Freq: Once | INTRAMUSCULAR | Status: AC

## 2023-03-11 MED ORDER — EPHEDRINE SULFATE (PRESSORS) 50 MG/ML IJ SOLN
INTRAMUSCULAR | Status: DC | PRN
  Administered 2023-03-11: 10 mg via INTRAVENOUS
  Administered 2023-03-11 (×2): 5 mg via INTRAVENOUS

## 2023-03-11 MED ORDER — PHENYLEPHRINE HCL (PRESSORS) 10 MG/ML IV SOLN
INTRAVENOUS | Status: AC
  Filled 2023-03-11: qty 1

## 2023-03-11 MED ORDER — CEFAZOLIN SODIUM-DEXTROSE 2-4 GM/100ML-% IV SOLN
2.0000 g | INTRAVENOUS | Status: AC
Start: 2023-03-11 — End: 2023-03-11
  Administered 2023-03-11: 2 g via INTRAVENOUS
  Filled 2023-03-11: qty 100

## 2023-03-11 MED ORDER — ACETAMINOPHEN 10 MG/ML IV SOLN: 1000.0000 mg | Freq: Once | INTRAVENOUS | Status: DC | PRN

## 2023-03-11 MED ORDER — LACTATED RINGERS IV SOLN: INTRAVENOUS | Status: AC

## 2023-03-11 SURGICAL SUPPLY — 61 items
BAG COUNTER SPONGE SURGICOUNT (BAG) IMPLANT
BAG ZIPLOCK 12X15 (MISCELLANEOUS) ×1 IMPLANT
BLADE SAW SGTL 83.5X18.5 (BLADE) ×1 IMPLANT
BNDG COHESIVE 4X5 TAN STRL LF (GAUZE/BANDAGES/DRESSINGS) ×1 IMPLANT
COOLER ICEMAN CLASSIC (MISCELLANEOUS) ×1 IMPLANT
COVER BACK TABLE 60X90IN (DRAPES) ×1 IMPLANT
COVER SURGICAL LIGHT HANDLE (MISCELLANEOUS) ×1 IMPLANT
CUP SUT UNIV REVERS 39 NEU (Shoulder) IMPLANT
DERMABOND ADVANCED .7 DNX12 (GAUZE/BANDAGES/DRESSINGS) ×1 IMPLANT
DERMABOND ADVANCED .7 DNX6 (GAUZE/BANDAGES/DRESSINGS) IMPLANT
DRAPE SHEET LG 3/4 BI-LAMINATE (DRAPES) ×1 IMPLANT
DRAPE SURG 17X11 SM STRL (DRAPES) ×1 IMPLANT
DRAPE SURG ORHT 6 SPLT 77X108 (DRAPES) ×2 IMPLANT
DRAPE TOP 10253 STERILE (DRAPES) ×1 IMPLANT
DRAPE U-SHAPE 47X51 STRL (DRAPES) ×1 IMPLANT
DRESSING AQUACEL AG SP 3.5X6 (GAUZE/BANDAGES/DRESSINGS) ×1 IMPLANT
DRSG AQUACEL AG ADV 3.5X10 (GAUZE/BANDAGES/DRESSINGS) IMPLANT
DRSG AQUACEL AG SP 3.5X6 (GAUZE/BANDAGES/DRESSINGS) ×1
DURAPREP 26ML APPLICATOR (WOUND CARE) ×1 IMPLANT
ELECT BLADE TIP CTD 4 INCH (ELECTRODE) ×1 IMPLANT
ELECT PENCIL ROCKER SW 15FT (MISCELLANEOUS) ×1 IMPLANT
ELECT REM PT RETURN 15FT ADLT (MISCELLANEOUS) ×1 IMPLANT
FACESHIELD WRAPAROUND (MASK) ×5 IMPLANT
FACESHIELD WRAPAROUND OR TEAM (MASK) ×5 IMPLANT
GLENOID UNI REV MOD 24 +2 LAT (Joint) IMPLANT
GLENOSPHERE 39+4 LAT/24 UNI RV (Joint) IMPLANT
GLOVE BIO SURGEON STRL SZ7.5 (GLOVE) ×1 IMPLANT
GLOVE BIO SURGEON STRL SZ8 (GLOVE) ×1 IMPLANT
GLOVE SS BIOGEL STRL SZ 7 (GLOVE) ×1 IMPLANT
GLOVE SS BIOGEL STRL SZ 7.5 (GLOVE) ×1 IMPLANT
GOWN STRL SURGICAL XL XLNG (GOWN DISPOSABLE) ×2 IMPLANT
INSERT HUMERAL 39/+6 (Insert) IMPLANT
KIT BASIN OR (CUSTOM PROCEDURE TRAY) ×1 IMPLANT
KIT TURNOVER KIT A (KITS) IMPLANT
MANIFOLD NEPTUNE II (INSTRUMENTS) ×1 IMPLANT
NDL TAPERED W/ NITINOL LOOP (MISCELLANEOUS) ×1 IMPLANT
NEEDLE TAPERED W/ NITINOL LOOP (MISCELLANEOUS) ×1 IMPLANT
NS IRRIG 1000ML POUR BTL (IV SOLUTION) ×1 IMPLANT
PACK SHOULDER (CUSTOM PROCEDURE TRAY) ×1 IMPLANT
PAD ARMBOARD 7.5X6 YLW CONV (MISCELLANEOUS) ×1 IMPLANT
PAD COLD SHLDR WRAP-ON (PAD) ×1 IMPLANT
PIN NITINOL TARGETER 2.8 (PIN) IMPLANT
PIN SET MODULAR GLENOID SYSTEM (PIN) IMPLANT
RESTRAINT HEAD UNIVERSAL NS (MISCELLANEOUS) ×1 IMPLANT
SCREW CENTRAL MOD 35 (Screw) IMPLANT
SCREW PERI LOCK 5.5X36 (Screw) IMPLANT
SCREW PERIPHERAL 5.5X20 LOCK (Screw) IMPLANT
SCREW PERIPHERAL 5.5X40 LOCK (Screw) IMPLANT
SLING ARM FOAM STRAP LRG (SOFTGOODS) IMPLANT
SLING ARM FOAM STRAP MED (SOFTGOODS) IMPLANT
STEM HUM UNIV REV SZ11 (Stem) IMPLANT
SUT MNCRL AB 3-0 PS2 18 (SUTURE) ×1 IMPLANT
SUT MON AB 2-0 CT1 36 (SUTURE) ×1 IMPLANT
SUT VIC AB 1 CT1 36 (SUTURE) ×1 IMPLANT
SUTURE TAPE 1.3 40 TPR END (SUTURE) ×2 IMPLANT
SUTURETAPE 1.3 40 TPR END (SUTURE) ×2
TOWEL OR 17X26 10 PK STRL BLUE (TOWEL DISPOSABLE) ×1 IMPLANT
TOWEL OR NON WOVEN STRL DISP B (DISPOSABLE) ×1 IMPLANT
TUBE SUCTION HIGH CAP CLEAR NV (SUCTIONS) ×1 IMPLANT
TUBING CONNECTING 10 (TUBING) ×1 IMPLANT
WATER STERILE IRR 1000ML POUR (IV SOLUTION) ×2 IMPLANT

## 2023-03-11 NOTE — Anesthesia Postprocedure Evaluation (Signed)
Anesthesia Post Note  Patient: Manuel Barrett  Procedure(s) Performed: RIGHT REVERSE SHOULDER ARTHROPLASTY (Right: Shoulder)     Patient location during evaluation: PACU Anesthesia Type: General Level of consciousness: awake and alert Pain management: pain level controlled Vital Signs Assessment: post-procedure vital signs reviewed and stable Respiratory status: spontaneous breathing, nonlabored ventilation, respiratory function stable and patient connected to nasal cannula oxygen Cardiovascular status: blood pressure returned to baseline and stable Postop Assessment: no apparent nausea or vomiting Anesthetic complications: no   No notable events documented.  Last Vitals:  Vitals:   03/11/23 1315 03/11/23 1329  BP: (!) 152/63 (!) 155/64  Pulse: 64 60  Resp: 12 12  Temp:  (!) 36.3 C  SpO2: 93% 93%              Shelton Silvas

## 2023-03-11 NOTE — H&P (Signed)
Manuel Barrett    Chief Complaint: Right shoulder rotator cuff tear arthropathy HPI: The patient is a 77 y.o. male with chronic and progressively increasing right shoulder pain related to severe rotator cuff tear arthropathy.  Due to his increasing functional imitations and failure to respond to prolonged attempts at conservative management, he is brought to the operating today for planned right shoulder reverse arthroplasty.  Past Medical History:  Diagnosis Date   Arthritis    Bladder tumor    CAD in native artery followed by pcp at the [place] in [place] 11-13-2016 pt denies S&S   per cardiac cath 03-06-2014  by dr Swaziland--  moderate non-obstructive cad   Cancer [facility])    bladder   Chronic kidney disease    Emphysema/COPD (HCC)    GERD (gastroesophageal reflux disease)    Gout    per pt currently stable 11-13-2016   History of kidney stones    History of MRSA infection    right hand abscess 09/ 2016   Hypertension    Left inguinal hernia    PAD (peripheral artery disease) (HCC) first dx 1994---  was followed by vascular dr Madilyn Fireman   s/p  fem-pop bypass graft 1994 and 2001/  [facility] [place] [facility]2016 denies s&s   Rotator cuff syndrome of left shoulder    Wears hearing aid in both ears       Past Surgical History:  Procedure Laterality Date   ANTERIOR CERVICAL DECOMP/DISCECTOMY FUSION  02/1999   AORTA - BILATERAL FEMORAL ARTERY BYPASS GRAFT  2001  dr Madilyn Fireman   APPENDECTOMY  1959   BLADDER SURGERY  11/2022   COLONOSCOPY N/A 02/06/2021   Procedure: COLONOSCOPY;  Surgeon: Malissa Hippo, MD;  Location: [facility];  Service: Endoscopy;  Laterality: N/A;  10:30   CYSTOSCOPY W/ RETROGRADES Bilateral 10/31/2019   Procedure: CYSTOSCOPY WITH BILATERAL RETROGRADE PYELOGRAM;  Surgeon: Bjorn Pippin, MD;  Location: [facility] OR[facility];  Service: Urology;  Laterality: Bilateral;   CYSTOSCOPY WITH BIOPSY Bilateral 12/02/2021   Procedure: CYSTOSCOPY WITH BLADDER BIOPSY AND  FULGURATION BILATERAL  RETROGRADES LEFT URETEROSCOPY  left stent placement;  Surgeon: Bjorn Pippin, MD;  Location: [facility];  Service: Urology;  Laterality: Bilateral;  1HR FOR CASE   CYSTOSCOPY WITH STENT PLACEMENT Left 11/26/2016   Procedure: CYSTOSCOPY WITH left double J STENT PLACEMENT;  Surgeon: Bjorn Pippin, MD;  [facility];  Service: Urology;  Laterality: Left;   FEMORAL-POPLITEAL BYPASS GRAFT  1994   dr Madilyn Fireman   I & D EXTREMITY Right 01/09/2015   Procedure: IRRIGATION AND DEBRIDEMENT RIGHT THUMB/HAND ;  Surgeon: Dominica Severin, MD;  Location: [facility] OR;  Service: Orthopedics;  Laterality: Right;   LEFT HEART CATHETERIZATION WITH CORONARY ANGIOGRAM N/A 03/06/2014   Procedure: LEFT HEART CATHETERIZATION WITH CORONARY ANGIOGRAM;  Surgeon: Peter M Swaziland, MD;  Location: [facility] CATH LAB;  Service: Cardiovascular;  Laterality: N/A;  midLAD 50-60%; D1 20-30%; mCFX 30%; pRCA 30%;  ef 55-65%   LUMBAR SPINE SURGERY  1980's   TRANSURETHRAL RESECTION OF BLADDER TUMOR N/A 11/26/2016   Procedure: TRANSURETHRAL RESECTION OF BLADDER TUMOR (TURBT)/ left ureteroscopy;  Surgeon: Bjorn Pippin, MD;  Location: [facility];  Service: Urology;  Laterality: N/A;   TRANSURETHRAL RESECTION OF BLADDER TUMOR WITH MITOMYCIN-C N/A 10/31/2019   Procedure: TRANSURETHRAL RESECTION OF BLADDER TUMOR WITH GEMCITABINE;  Surgeon: Bjorn Pippin, MD;  Location: [facility];  Service: Urology;  Laterality: N/A;    Family History  Problem Relation Age of Onset   Dementia Mother    Heart failure Father    COPD Father    Colon cancer Neg Hx     Social History:  reports that he quit smoking about 30 years ago. His smoking use included cigarettes. He started smoking about 60 years ago. He has a 45 pack-year smoking history. He has never used smokeless tobacco. He reports that he does not currently use alcohol. He reports that he does not use drugs.  BMI: Estimated body mass index is 25.54 kg/m as calculated  from the following:   Height as of 03/09/23: 5\' 10"  (1.778 m).   Weight as of 03/09/23: 80.7 kg.  Lab Results  Component Value Date   ALBUMIN 4.1 02/22/2020   Diabetes: Patient does not have a diagnosis of diabetes.     Smoking Status:       No medications prior to admission.     Physical Exam: Right shoulder demonstrates painful and guarded motion as noted at his recent office visits.  He has globally decreased strength to manual muscle testing.  Otherwise neurovascular intact in the right upper extremity.  Imaging studies confirm changes consistent with chronic rotator cuff tear arthropathy.  Vitals     Assessment/Plan  Impression: Right shoulder rotator cuff tear arthropathy  Plan of Action: Procedure(s): REVERSE SHOULDER ARTHROPLASTY  Jagger Demonte M Myah Guynes 03/11/2023, 5:47 AM Contact # [phone]

## 2023-03-11 NOTE — Anesthesia Procedure Notes (Signed)
Anesthesia Regional Block: Interscalene brachial plexus block   Pre-Anesthetic Checklist: , timeout performed,  Correct Patient, Correct Site, Correct Laterality,  Correct Procedure, Correct Position, site marked,  Risks and benefits discussed,  Surgical consent,  Pre-op evaluation,  At surgeon's request and post-op pain management  Laterality: Right  Prep: chloraprep       Needles:  Injection technique: Single-shot  Needle Type: Echogenic Stimulator Needle     Needle Length: 9cm  Needle Gauge: 21     Additional Needles:   Procedures:,,,, ultrasound used (permanent image in chart),,    Narrative:  Start time: 03/11/2023 9:00 AM End time: 03/11/2023 9:05 AM Injection made incrementally with aspirations every 5 mL.  Performed by: Personally  Anesthesiologist: Shelton Silvas, MD  Additional Notes: Discussed risks and benefits of the nerve block in detail, including but not limited vascular injury, permanent nerve damage and infection.   Patient tolerated the procedure well. Local anesthetic introduced in an incremental fashion under minimal resistance after negative aspirations. No paresthesias were elicited. After completion of the procedure, no acute issues were identified and patient continued to be monitored by RN.

## 2023-03-11 NOTE — Anesthesia Procedure Notes (Signed)
Procedure Name: Intubation Date/Time: 03/11/2023 10:07 AM  Performed by: Nathen May, CRNAPre-anesthesia Checklist: Patient identified, Emergency Drugs available, Suction available and Patient being monitored Patient Re-evaluated:Patient Re-evaluated prior to induction Oxygen Delivery Method: Circle System Utilized Preoxygenation: Pre-oxygenation with 100% oxygen Induction Type: IV induction Ventilation: Mask ventilation without difficulty Laryngoscope Size: Mac, 3 and Glidescope Grade View: Grade I Tube type: Oral Tube size: 7.5 mm Number of attempts: 1 Airway Equipment and Method: Stylet and Oral airway Placement Confirmation: ETT inserted through vocal cords under direct vision, positive ETCO2 and breath sounds checked- equal and bilateral Secured at: 23 cm Tube secured with: Tape Dental Injury: Teeth and Oropharynx as per pre-operative assessment

## 2023-03-11 NOTE — Op Note (Signed)
03/11/2023  11:13 AM  PATIENT:   Manuel Barrett  77 y.o. male  PRE-OPERATIVE DIAGNOSIS:  Right shoulder rotator cuff tear arthropathy  POST-OPERATIVE DIAGNOSIS: Same  PROCEDURE: Right shoulder reverse arthroplasty utilizing a press-fit size 11 Arthrex stem with a neutral metathesis, +6 polyethylene insert, 39/+4 glenosphere and a small/+2 baseplate  SURGEON:  Autum Benfer, Vania Rea M.D.  ASSISTANTS: Ralene Bathe, PA-C  Ralene Bathe, PA-C was utilized as an Geophysicist/field seismologist throughout this case, essential for help with positioning the patient, positioning extremity, tissue manipulation, implantation of the prosthesis, suture management, wound closure, and intraoperative decision-making.  ANESTHESIA:   General Endotracheal and interscalene block with Exparel  EBL: 200 cc  SPECIMEN: None  Drains: None   PATIENT DISPOSITION:  PACU - hemodynamically stable.    PLAN OF CARE: Discharge to home after PACU  Brief history:  Patient is a 77 year old gentleman with chronic and progressive increasing right shoulder pain related to severe osteoarthritis and associated significant chronic degeneration and dysfunction of the rotator cuff.  Due to his increasing functional imitations and failure to respond to prolonged attempts at conservative management, he is brought to the operating this time for planned right shoulder reverse arthroplasty.  Preoperatively, I counseled the patient regarding treatment options and risks versus benefits thereof.  Possible surgical complications were all reviewed including potential for bleeding, infection, neurovascular injury, persistent pain, loss of motion, anesthetic complication, failure of the implant, and possible need for additional surgery. They understand and accept and agrees with our planned procedure.   Procedure detail:  After undergoing routine preop evaluation the patient received prophylactic antibiotics and interscalene block with Exparel was  established in the holding area by the anesthesia department.  Subsequently placed spine on the operating table and underwent the smooth induction of a general endotracheal anesthesia.  Placed into the beachchair position and appropriately padded and protected.  The right shoulder girdle region was sterilely prepped and draped in standard fashion.  Timeout was called.  A deltopectoral approach the right shoulder is made an approximately 10 cm incision.  Skin flaps elevated dissection carried deeply and the deltopectoral interval was then developed from proximal to distal with the vein taken laterally.  The conjoined tendon was mobilized and retracted medially.  The long head biceps tendon was tenodesed at the upper border the pectoralis major tendon with the proximal segment unroofed and excised.  Superior aspect of the rotator cuff was split from the apex of the bicipital groove to the base of the coracoid and the subscap was separated from the lesser tuberosity using electrocautery the free margin was tagged with a pair of grasping suture tape sutures.  Capsular attachments were then divided from the anterior and inferior margins of the humeral neck and the humeral head was then delivered through the wound.  An extra medullary guide was used to outline the proposed humeral head resection which we performed with an oscillating saw at approximately 20 degrees of retroversion.  Retained marginal osteophytes were then removed with a rondure.  A metal cap placed over the cut proximal humeral surface and the glenoid was then exposed and a circumferential labral resection performed.  Had been directed into the center of the glenoid and it was then reamed with the central followed by peripheral reamer to a stable subchondral bony bed and all debris was carefully irrigated and removed.  Preparation completed with a drill and tapped for a 35 mm lag screw.  A baseplate was then assembled and inserted with vancomycin powder  applied to the threads of the lag screw and then peripheral locking screws were all then placed using standard technique.  A 39/+4 glenosphere was then impacted onto the baseplate and a central locking screw was placed.  Our attention was then returned to the humeral metaphysis where the canal was opened by hand reaming and we ultimately broached up to a size 11 stem in approximately 20 degrees of retroversion.  A neutral metaphyseal reaming guide was then used to prepare the metaphysis.  A trial implant was then placed and this showed good motion stability and soft tissue balance.  At this point the trial was removed.  The canal was irrigated cleaned and dried.  The final implant was assembled and inserted after vancomycin powder has been sprayed into the canal.  Excellent seating was achieved.  Trial reductions were then performed and ultimately felt that a +6 poly gives the best motion stability and soft tissue balance.  Trial poly was then removed and the final poly was impacted.  Final reduction was performed which again showed excellent motor stability and soft tissue balance all much more satisfaction.  Copious irrigation was then completed.  Hemostasis was obtained.  We mobilized the subscap and confirmed good elasticity and it was then repaired back to the eyelets on the collar of the implant using the previously placed suture tape sutures.  The deltopectoral interval was reapproximated with a series of figure-of-eight number Vicryl sutures.  2-0 Monocryl used to close the subcu layer and intracuticular 3-0 Monocryl used to close the skin followed by Dermabond and an Aquacel dressing.  The right arm was placed into a sling.  The patient was awakened, extubated, and taken to the recovery in stable condition.  Senaida Lange MD   Contact # [phone]

## 2023-03-11 NOTE — Transfer of Care (Signed)
Immediate Anesthesia Transfer of Care Note  Patient: Manuel Barrett  Procedure(s) Performed: RIGHT REVERSE SHOULDER ARTHROPLASTY (Right: Shoulder)  Patient Location: PACU  Anesthesia Type:General and Regional  Level of Consciousness: awake, oriented, and drowsy  Airway & Oxygen Therapy: Patient Spontanous Breathing and Patient connected to face mask oxygen  Post-op Assessment: Report given to RN and Post -op Vital signs reviewed and stable  Post vital signs: Reviewed and stable  Last Vitals:  Vitals Value Taken Time  BP    Temp    Pulse 67 03/11/23 1137  Resp 18 03/11/23 1137  SpO2 100 % 03/11/23 1137  Vitals shown include unfiled device data.  Last Pain:  Vitals:   03/11/23 0905  TempSrc:   PainSc: 0-No pain         Complications: No notable events documented.

## 2023-03-11 NOTE — Evaluation (Signed)
Occupational Therapy Evaluation Patient Details Name: Manuel Barrett MRN: [id number] DOB: [birth year] Today's Date: 03/11/2023   History of Present Illness 77 yo M s/p TSA - Reverse   Clinical Impression   Patient admitted for the surgery above.  PTA he lives at home with his spouse, who can assist, and needed no assist with ADL,iADL or mobility.  Patient is needing expected level of assist given R shoulder block in place impacting functional use.  All education completed and appropriate handouts documented in education given.  Patient and spouse with no further questions.  No further OT needs in the acute setting and recommend follow up with MD as prescribed.        If plan is discharge home, recommend the following: Assist for transportation;Assistance with cooking/housework;A little help with bathing/dressing/bathroom    Functional Status Assessment  Patient has had a recent decline in their functional status and demonstrates the ability to make significant improvements in function in a reasonable and predictable amount of time.  Equipment Recommendations  None recommended by OT    Recommendations for Other Services       Precautions / Restrictions Precautions Precautions: Shoulder Type of Shoulder Precautions: Abd to 45, FF to 60 and ER to 20.  No IR Shoulder Interventions: Shoulder sling/immobilizer;Off for dressing/bathing/exercises Precaution Booklet Issued: Yes (comment) Restrictions Weight Bearing Restrictions: Yes RUE Weight Bearing: Non weight bearing      Mobility Bed Mobility                    Transfers                          Balance Overall balance assessment: Mild deficits observed, not formally tested                                         ADL either performed or assessed with clinical judgement   ADL                   Upper Body Dressing : Moderate assistance   Lower Body Dressing: Minimal assistance    Toilet Transfer: Supervision/safety                   Vision Patient Visual Report: No change from baseline       Perception Perception: Not tested       Praxis Praxis: Not tested       Pertinent Vitals/Pain Pain Assessment Pain Assessment: Faces Faces Pain Scale: No hurt Pain Location: Block in place Pain Intervention(s): Monitored during session     Extremity/Trunk Assessment Upper Extremity Assessment Upper Extremity Assessment: Right hand dominant;RUE deficits/detail RUE Deficits / Details: post surgical block RUE Sensation: decreased light touch;decreased proprioception RUE Coordination: decreased fine motor;decreased gross motor   Lower Extremity Assessment Lower Extremity Assessment: Overall WFL for tasks assessed   Cervical / Trunk Assessment Cervical / Trunk Assessment: Normal   Communication Communication Communication: No apparent difficulties   Cognition Arousal: Alert Behavior During Therapy: WFL for tasks assessed/performed Overall Cognitive Status: Within Functional Limits for tasks assessed                                       General Comments   VSS  Exercises     Shoulder Instructions      Home Living Family/patient expects to be discharged to:: Private residence Living Arrangements: Spouse/significant other Available Help at Discharge: Family Type of Home: House Home Access: Stairs to enter     Home Layout: One level     Bathroom Shower/Tub: Producer, television/film/video: Standard Bathroom Accessibility: Yes How Accessible: Accessible via walker Home Equipment: None          Prior Functioning/Environment Prior Level of Function : Independent/Modified Independent;Driving                        OT Problem List: Decreased range of motion      OT Treatment/Interventions:      OT Goals(Current goals can be found in the care plan section) Acute Rehab OT Goals Patient Stated Goal:  Return home OT Goal Formulation: With patient Time For Goal Achievement: 03/15/23 Potential to Achieve Goals: Good  OT Frequency:      Co-evaluation              AM-PAC OT "6 Clicks" Daily Activity     Outcome Measure Help from another person eating meals?: None Help from another person taking care of personal grooming?: None Help from another person toileting, which includes using toliet, bedpan, or urinal?: A Little Help from another person bathing (including washing, rinsing, drying)?: A Lot Help from another person to put on and taking off regular upper body clothing?: A Lot Help from another person to put on and taking off regular lower body clothing?: A Little 6 Click Score: 18   End of Session Nurse Communication: Mobility status  Activity Tolerance: Patient tolerated treatment well Patient left: in chair;with call bell/phone within reach  OT Visit Diagnosis: Pain Pain - Right/Left: Right Pain - part of body: Shoulder                Time: 1330-1411 OT Time Calculation (min): 41 min Charges:  OT General Charges $OT Visit: 1 Visit OT Evaluation $OT Eval Moderate Complexity: 1 Mod OT Treatments $Self Care/Home Management : 23-37 mins  03/11/2023  RP, OTR/L  Acute Rehabilitation Services  Office:  [phone]   Suzanna Obey 03/11/2023, 2:16 PM

## 2023-03-11 NOTE — Discharge Instructions (Signed)

## 2023-03-12 ENCOUNTER — Encounter (HOSPITAL_COMMUNITY): Payer: Self-pay | Admitting: Orthopedic Surgery

## 2023-03-30 ENCOUNTER — Other Ambulatory Visit: Payer: Self-pay

## 2023-03-30 ENCOUNTER — Ambulatory Visit
Payer: No Typology Code available for payment source | Attending: Reproductive Endocrinology and Infertility | Admitting: Physical Therapy

## 2023-03-30 DIAGNOSIS — M25611 Stiffness of right shoulder, not elsewhere classified: Secondary | ICD-10-CM | POA: Insufficient documentation

## 2023-03-30 DIAGNOSIS — M25511 Pain in right shoulder: Secondary | ICD-10-CM | POA: Diagnosis not present

## 2023-03-30 DIAGNOSIS — G8929 Other chronic pain: Secondary | ICD-10-CM | POA: Insufficient documentation

## 2023-03-30 NOTE — Therapy (Signed)
OUTPATIENT PHYSICAL THERAPY SHOULDER EVALUATION   Patient Name: Manuel Barrett MRN: [id number] DOB:1945/06/10, 77 y.o., male Today's Date: 03/30/2023  END OF SESSION:  PT End of Session - 03/30/23 1229     Visit Number 1    Number of Visits 8    Date for PT Re-Evaluation 04/27/23    Authorization Type FOTO.    PT Start Time 0845    PT Stop Time 0923    PT Time Calculation (min) 38 min    Activity Tolerance Patient tolerated treatment well    Behavior During Therapy [facility] for tasks assessed/performed             Past Medical History:  Diagnosis Date   Arthritis    Bladder tumor    CAD in native artery followed by pcp at the [place] in [place] 11-13-2016 pt denies S&S   per cardiac cath 03-06-2014  by dr Swaz[place]and--  moderate non-obstructive cad   Cancer [facility])    bladder   Chronic kidney disease    Emphysema/COPD (HCC)    GERD (gastroesophageal reflux disease)    Gout    per pt currently stable 11-13-2016   History of kidney stones    History of MRSA infection    right hand abscess 09/ 2016   Hypertension    Left inguinal hernia    PAD (peripheral artery disease) (HCC) first dx 1994---  was followed by vascular dr Madilyn Fireman   s/p  fem-pop bypass graft 1994 and 2001/  [facility] [place] [facility]2016 denies s&s   Rotator cuff syndrome of left shoulder    Wears hearing aid in both ears    Past Surgical History:  Procedure Laterality Date   ANTERIOR CERVICAL DECOMP/DISCECTOMY FUSION  02/1999   AORTA - BILATERAL FEMORAL ARTERY BYPASS GRAFT  2001  dr Madilyn Fireman   APPENDECTOMY  1959   BLADDER SURGERY  11/2022   COLONOSCOPY N/A 02/06/2021   Procedure: COLONOSCOPY;  Surgeon: Malissa Hippo, MD;  [facility];  Service: Endoscopy;  Laterality: N/A;  10:30   CYSTOSCOPY W/ RETROGRADES Bilateral 10/31/2019   Procedure: CYSTOSCOPY WITH BILATERAL RETROGRADE PYELOGRAM;  Surgeon: Bjorn Pippin, MD;  [facility]: [facility];  Service: Urology;  Laterality: Bilateral;    CYSTOSCOPY WITH BIOPSY Bilateral 12/02/2021   Procedure: CYSTOSCOPY WITH BLADDER BIOPSY AND FULGURATION BILATERAL  RETROGRADES LEFT URETEROSCOPY  left stent placement;  Surgeon: Bjorn Pippin, MD;  [facility]: [facility];  Service: Urology;  Laterality: Bilateral;  1HR FOR CASE   CYSTOSCOPY WITH STENT PLACEMENT Left 11/26/2016   Procedure: CYSTOSCOPY WITH left double J STENT PLACEMENT;  Surgeon: Bjorn Pippin, MD;  Location: [facility];  Service: Urology;  Laterality: Left;   FEMORAL-POPLITEAL BYPASS GRAFT  1994   dr Madilyn Fireman   I & D EXTREMITY Right 01/09/2015   Procedure: IRRIGATION AND DEBRIDEMENT RIGHT THUMB/HAND ;  Surgeon: Dominica Severin, MD;  [facility];  Service: Orthopedics;  Laterality: Right;   LEFT HEART CATHETERIZATION WITH CORONARY ANGIOGRAM N/A 03/06/2014   Procedure: LEFT HEART CATHETERIZATION WITH CORONARY ANGIOGRAM;  Surgeon: Peter M Swaziland, MD;  Location: [facility] CATH LAB;  Service: Cardiovascular;  Laterality: N/A;  midLAD 50-60%; D1 20-30%; mCFX 30%; pRCA 30%;  ef 55-65%   LUMBAR SPINE SURGERY  1980's   REVERSE SHOULDER ARTHROPLASTY Right 03/11/2023   Procedure: RIGHT REVERSE SHOULDER ARTHROPLASTY;  Surgeon: Francena Hanly, MD;  [facility];  Service: Orthopedics;  Laterality: Right;    TRANSURETHRAL RESECTION  OF BLADDER TUMOR N/A 11/26/2016   Procedure: TRANSURETHRAL RESECTION OF BLADDER TUMOR (TURBT)/ left ureteroscopy;  Surgeon: Bjorn Pippin, MD;  [facility];  Service: Urology;  Laterality: N/A;   TRANSURETHRAL RESECTION OF BLADDER TUMOR WITH MITOMYCIN-C N/A 10/31/2019   Procedure: TRANSURETHRAL RESECTION OF BLADDER TUMOR WITH GEMCITABINE;  Surgeon: Bjorn Pippin, MD;  [facility];  Service: Urology;  Laterality: N/A;   Patient Active Problem List   Diagnosis Date Noted   COPD GOLD II  11/12/2017   Primary osteoarthritis, left shoulder 01/06/2017   CAD in native artery 03/28/2014   Hyperlipidemia LDL goal <70 03/28/2014    Essential hypertension    REFERRING PROVIDER: Francena Hanly MD  REFERRING DIAG: S/p right total shoulder replacement  THERAPY DIAG:  Chronic right shoulder pain  Stiffness of right shoulder, not elsewhere classified  Rationale for Evaluation and Treatment: Rehabilitation  ONSET DATE: 03/11/23 (surgery date).  SUBJECTIVE:                                                                                                                                                                                      SUBJECTIVE STATEMENT: The patient presents to the clinic s/p right total shoulder replacement performed on 03/11/23.  His pain is a low 2/10 today.  He out of his sling today and does admit to actively moving his shoulder.  He describes the pain as an ache.  He is pleased with his surgical outcome thus far.  PERTINENT HISTORY: See above.  PAIN:  Are you having pain? Yes: NPRS scale: 2/10 Pain location: Right shoulder. Pain description: Ache. Aggravating factors: Certain movements. Relieving factors: Rest.  PRECAUTIONS: Other: Progress per protocol.   No ultrasound.   WEIGHT BEARING RESTRICTIONS:  No right UE weight bearing.  FALLS:  Has patient fallen in last 6 months? No  LIVING ENVIRONMENT: Lives with: lives with their spouse Lives in: House/apartment Has following equipment at home: None  OCCUPATION: Retired.  PLOF: Independent  PATIENT GOALS:Use right UE without pain.  NEXT MD VISIT:   OBJECTIVE:  Note: Objective measures were completed at Evaluation unless otherwise noted.  PATIENT SURVEYS:  FOTO 49.29.  UPPER EXTREMITY ROM:   In supine:  Gentle right shoulder passive flexion to 75 degrees and ER is 10 degrees.  PALPATION:  Minimal c/o right anterior shoulder pain.  His incisional site appears to be healing well.   TODAY'S TREATMENT:  DATE: Vasopneumatic on low x 15 minutes to patient's right shoulder with pillow between right elbow and thorax.   PATIENT EDUCATION: Education details: Discussed progression per protocol and instruct in home exercise below. Person educated: Patient Education method: Explanation, demo, handout Education comprehension: verbalized understanding  HOME EXERCISE PROGRAM: HOME EXERCISE PROGRAM Created by [place] Tyvion Edmondson Dec 10th, 2024 View at [email or web address] using code: [email or web address]  Page 1 of 1 1 Exercise WAND EXTERNAL ROTATION - SUPINE ER Lie on your back holding a cane or wand with both hands.  On the affected side, place a small rolled up towel or pillow under your elbow. Maintain approx. 90 degree bend at the elbow with your arm approximately 30-45 degrees away from your side. GENTLE. PAIN-FREE Use your other arm to pull the wand/cane to rotate the affected arm back into a stretch. Hold and then return to starting position and then repeat. Repeat 10 Times Hold 15 Seconds Complete 1 Set Perform 4 Times a Day  ASSESSMENT:  CLINICAL IMPRESSION: The patient presents to [facility]PP[facility] s/p right reverse total shoulder replacement performed on 03/11/23.  He has an expected loss of right shoulder motion and function.  His FOTO limitation score is a 49.29.  His incisional site appears to be healing well and he has some anterior shoulder tenderness.  Patient will benefit from skilled physical therapy intervention to address pain and deficits.  OBJECTIVE IMPAIRMENTS: decreased activity tolerance, decreased ROM, and pain.   ACTIVITY LIMITATIONS: carrying, lifting, and reach over head  PARTICIPATION LIMITATIONS: meal prep, cleaning, laundry, and yard work  [facility] POTENTIAL: Excellent  CLINICAL DECISION MAKING: Stable/uncomplicated  EVALUATION COMPLEXITY: Low   GOALS:  SHORT TERM GOALS: Target date: 04/27/23  Ind with an initial HEP. Goal status: INITIAL  2.  Passive right  shoulder ER to 45 degrees.  Goal status: INITIAL  3.  Passive right shoulder flexion to 115 degrees.  Goal status: INITIAL   LONG TERM GOALS: Target date: 05/25/23.  Ind with an advanced HEP.  Goal status: INITIAL  2.  Active right shoulder flexion to 135 degrees so the patient can easily reach overhead.  Goal status: INITIAL  3.  Active ER to 60 degrees+ to allow for easily donning/doffing of apparel.  Goal status: INITIAL  4.  Perform ADL's with right shoulder pain not > 2-3/10.  Goal status: INITIAL  5.  Increase right shoulder strength to a solid 4/5 to increase stability for performance of functional activities.  Goal status: INITIAL PLAN:  PT FREQUENCY: 2x/week  PT DURATION: 4 weeks  PLANNED INTERVENTIONS: 97110-Therapeutic exercises, 97530- Therapeutic activity, [id number]- Neuromuscular re-education, 97535- Self Care, 29528- Manual therapy, 97014- Electrical stimulation (unattended), 97016- Vasopneumatic device, Patient/Family education, Cryotherapy, and Moist heat  PLAN FOR NEXT SESSION: Progress per protocol.  Please review HEP.  Vasopneumatic with pillow between right elbow and thorax.   Breunna Nordmann, [place], PT 03/30/2023, 1:00 PM

## 2023-04-02 ENCOUNTER — Encounter: Payer: Self-pay | Admitting: *Deleted

## 2023-04-02 ENCOUNTER — Ambulatory Visit: Payer: No Typology Code available for payment source | Admitting: *Deleted

## 2023-04-02 DIAGNOSIS — G8929 Other chronic pain: Secondary | ICD-10-CM

## 2023-04-02 DIAGNOSIS — M25611 Stiffness of right shoulder, not elsewhere classified: Secondary | ICD-10-CM

## 2023-04-02 NOTE — Therapy (Signed)
OUTPATIENT PHYSICAL THERAPY SHOULDER TREATMENT   Patient Name: Manuel Barrett MRN: [id number] DOB:[birth year], 77 y.o., male Today's Date: 04/02/2023  END OF SESSION:  PT End of Session - 04/02/23 0834     Visit Number 2    Number of Visits 8    Date for PT Re-Evaluation 04/27/23    Authorization Type FOTO.    PT Start Time [id number]    PT Stop Time 0920    PT Time Calculation (min) 46 min             Past Medical History:  Diagnosis Date   Arthritis    Bladder tumor    CAD in native artery followed by pcp at the [place] in [place] 11-13-2016 pt denies S&S   per cardiac cath 03-06-2014  by dr [place]--  moderate non-obstructive cad   Cancer [facility])    bladder   Chronic kidney disease    Emphysema/COPD (HCC)    GERD (gastroesophageal reflux disease)    Gout    per pt currently stable 11-13-2016   History of kidney stones    History of MRSA infection    right hand abscess 09/ 2016   Hypertension    Left inguinal hernia    PAD (peripheral artery disease) (HCC) first dx 1994---  was followed by vascular dr Madilyn Fireman   s/p  fem-pop bypass graft 1994 and 2001/  [facility] [place] [facility]2016 denies s&s   Rotator cuff syndrome of left shoulder    Wears hearing aid in both ears    Past Surgical History:  Procedure Laterality Date   ANTERIOR CERVICAL DECOMP/DISCECTOMY FUSION  02/1999   AORTA - BILATERAL FEMORAL ARTERY BYPASS GRAFT  2001  dr Madilyn Fireman   APPENDECTOMY  1959   BLADDER SURGERY  11/2022   COLONOSCOPY N/A 02/06/2021   Procedure: COLONOSCOPY;  Surgeon: Malissa Hippo, MD;  [facility];  Service: Endoscopy;  Laterality: N/A;  10:30   CYSTOSCOPY W/ RETROGRADES Bilateral 10/31/2019   Procedure: CYSTOSCOPY WITH BILATERAL RETROGRADE PYELOGRAM;  Surgeon: Bjorn Pippin, MD;  [facility];  Service: Urology;  Laterality: Bilateral;   CYSTOSCOPY WITH BIOPSY Bilateral 12/02/2021   Procedure: CYSTOSCOPY WITH BLADDER BIOPSY AND FULGURATION BILATERAL   RETROGRADES LEFT URETEROSCOPY  left stent placement;  Surgeon: Bjorn Pippin, MD;  [facility];  Service: Urology;  Laterality: Bilateral;  1HR FOR CASE   CYSTOSCOPY WITH STENT PLACEMENT Left 11/26/2016   Procedure: CYSTOSCOPY WITH left double J STENT PLACEMENT;  Surgeon: Bjorn Pippin, MD;  Location: [facility];  Service: Urology;  Laterality: Left;   FEMORAL-POPLITEAL BYPASS GRAFT  1994   dr Madilyn Fireman   I & D EXTREMITY Right 01/09/2015   Procedure: IRRIGATION AND DEBRIDEMENT RIGHT THUMB/HAND ;  Surgeon: Dominica Severin, MD;  [facility];  Service: Orthopedics;  Laterality: Right;   LEFT HEART CATHETERIZATION WITH CORONARY ANGIOGRAM N/A 03/06/2014   Procedure: LEFT HEART CATHETERIZATION WITH CORONARY ANGIOGRAM;  Surgeon: Peter M Swaziland, MD;  Location: [facility] CATH LAB;  Service: Cardiovascular;  Laterality: N/A;  midLAD 50-60%; D1 20-30%; mCFX 30%; pRCA 30%;  ef 55-65%   LUMBAR SPINE SURGERY  1980's   REVERSE SHOULDER ARTHROPLASTY Right 03/11/2023   Procedure: RIGHT REVERSE SHOULDER ARTHROPLASTY;  Surgeon: Francena Hanly, MD;  [facility]: [facility];  Service: Orthopedics;  Laterality: Right;    TRANSURETHRAL RESECTION OF BLADDER TUMOR N/A 11/26/2016   Procedure: TRANSURETHRAL RESECTION OF BLADDER TUMOR (TURBT)/ left ureteroscopy;  Surgeon: Annabell Howells,  Jonny Ruiz, MD;  Location: [facility];  Service: Urology;  Laterality: N/A;   TRANSURETHRAL RESECTION OF BLADDER TUMOR WITH MITOMYCIN-C N/A 10/31/2019   Procedure: TRANSURETHRAL RESECTION OF BLADDER TUMOR WITH GEMCITABINE;  Surgeon: Bjorn Pippin, MD;  [facility];  Service: Urology;  Laterality: N/A;   Patient Active Problem List   Diagnosis Date Noted   COPD GOLD II  11/12/2017   Primary osteoarthritis, left shoulder 01/06/2017   CAD in native artery 03/28/2014   Hyperlipidemia LDL goal <70 03/28/2014   Essential hypertension    REFERRING PROVIDER: Francena Hanly MD  REFERRING DIAG: S/p right total shoulder  replacement  THERAPY DIAG:  Chronic right shoulder pain  Stiffness of right shoulder, not elsewhere classified  Rationale for Evaluation and Treatment: Rehabilitation  ONSET DATE: 03/11/23 (surgery date).  SUBJECTIVE:                                                                                                                                                                                      SUBJECTIVE STATEMENT: The patient presents to the clinic s/p right total shoulder replacement performed on 03/11/23. RT shldr is doing good   PERTINENT HISTORY: See above.  PAIN:  Are you having pain? Yes: NPRS scale: 2/10 Pain location: Right shoulder. Pain description: Ache. Aggravating factors: Certain movements. Relieving factors: Rest.  PRECAUTIONS: Other: Progress per protocol.   No ultrasound.   WEIGHT BEARING RESTRICTIONS:  No right UE weight bearing.  FALLS:  Has patient fallen in last 6 months? No  LIVING ENVIRONMENT: Lives with: lives with their spouse Lives in: House/apartment Has following equipment at home: None  OCCUPATION: Retired.  PLOF: Independent  PATIENT GOALS:Use right UE without pain.  NEXT MD VISIT:   OBJECTIVE:  Note: Objective measures were completed at Evaluation unless otherwise noted.  PATIENT SURVEYS:  FOTO 49.29.  UPPER EXTREMITY ROM:   In supine:  Gentle right shoulder passive flexion to 75 degrees and ER is 10 degrees.  PALPATION:  Minimal c/o right anterior shoulder pain.  His incisional site appears to be healing well.   TODAY'S TREATMENT:  DATE:                                                                               04/02/2023                                    EXERCISE LOG    RT shldr  Exercise Repetitions and Resistance Comments  Pulleys X 5 mins   UE Ranger X 5 mins                 Blank cell = exercise not performed today  Manual PROM / AAROM RT shldr flexion and ER    Vasopneumatic on low x 15 minutes to patient's right shoulder with pillow between right elbow and thorax.   PATIENT EDUCATION: Education details: Discussed progression per protocol and instruct in home exercise below. Person educated: Patient Education method: Explanation, demo, handout Education comprehension: verbalized understanding  HOME EXERCISE PROGRAM: HOME EXERCISE PROGRAM Created by [place] Applegate Dec 10th, 2024 View at [email or web address] using code: [email or web address]  Page 1 of 1 1 Exercise WAND EXTERNAL ROTATION - SUPINE ER Lie on your back holding a cane or wand with both hands.  On the affected side, place a small rolled up towel or pillow under your elbow. Maintain approx. 90 degree bend at the elbow with your arm approximately 30-45 degrees away from your side. GENTLE. PAIN-FREE Use your other arm to pull the wand/cane to rotate the affected arm back into a stretch. Hold and then return to starting position and then repeat. Repeat 10 Times Hold 15 Seconds Complete 1 Set Perform 4 Times a Day  ASSESSMENT:  CLINICAL IMPRESSION: The patient presents to [facility]PP[facility] s/p right reverse total shoulder replacement performed on 03/11/23.Rx focused on AAROM as well as PROM for RT shldr. Pt did great and was able to reach 90 degrees elevation and ER to 40 available ROM today. Vaso end of session.     OBJECTIVE IMPAIRMENTS: decreased activity tolerance, decreased ROM, and pain.   ACTIVITY LIMITATIONS: carrying, lifting, and reach over head  PARTICIPATION LIMITATIONS: meal prep, cleaning, laundry, and yard work  [facility] POTENTIAL: Excellent  CLINICAL DECISION MAKING: Stable/uncomplicated  EVALUATION COMPLEXITY: Low   GOALS:  SHORT TERM GOALS: Target date: 04/27/23  Ind with an initial HEP. Goal status: INITIAL  2.  Passive right shoulder ER to 45 degrees.  Goal status:  INITIAL  3.  Passive right shoulder flexion to 115 degrees.  Goal status: INITIAL   LONG TERM GOALS: Target date: 05/25/23.  Ind with an advanced HEP.  Goal status: INITIAL  2.  Active right shoulder flexion to 135 degrees so the patient can easily reach overhead.  Goal status: INITIAL  3.  Active ER to 60 degrees+ to allow for easily donning/doffing of apparel.  Goal status: INITIAL  4.  Perform ADL's with right shoulder pain not > 2-3/10.  Goal status: INITIAL  5.  Increase right shoulder strength to a solid 4/5 to increase stability for performance of functional activities.  Goal status: INITIAL PLAN:  PT FREQUENCY: 2x/week  PT DURATION: 4 weeks  PLANNED INTERVENTIONS: 97110-Therapeutic exercises,  16109- Therapeutic activity, [id number]- Neuromuscular re-education, [id number]- Self Care, 60454- Manual therapy, 97014- Electrical stimulation (unattended), 97016- Vasopneumatic device, Patient/Family education, Cryotherapy, and Moist heat  PLAN FOR NEXT SESSION: Progress per protocol.  Please review HEP.  Vasopneumatic with pillow between right elbow and thorax.   Keltie Labell,CHRIS, PTA 04/02/2023, 10:58 AM

## 2023-04-05 ENCOUNTER — Encounter: Payer: Self-pay | Admitting: *Deleted

## 2023-04-05 ENCOUNTER — Ambulatory Visit: Payer: No Typology Code available for payment source | Admitting: *Deleted

## 2023-04-05 DIAGNOSIS — G8929 Other chronic pain: Secondary | ICD-10-CM

## 2023-04-05 DIAGNOSIS — M25611 Stiffness of right shoulder, not elsewhere classified: Secondary | ICD-10-CM

## 2023-04-05 NOTE — Therapy (Signed)
OUTPATIENT PHYSICAL THERAPY SHOULDER TREATMENT   Patient Name: Manuel Barrett MRN: [id number] DOB:10-03-1945, 77 y.o., male Today's Date: 04/05/2023  END OF SESSION:  PT End of Session - 04/05/23 0810     Visit Number 3    Number of Visits 8    Date for PT Re-Evaluation 04/27/23    Authorization Type FOTO.    PT Start Time 0800    PT Stop Time 0850    PT Time Calculation (min) 50 min             Past Medical History:  Diagnosis Date   Arthritis    Bladder tumor    CAD in native artery followed by pcp at the [facility] in [place] 11-13-2016 pt denies S&S   per cardiac cath 03-06-2014  by dr [place]--  moderate non-obstructive cad   Cancer [facility])    bladder   Chronic kidney disease    Emphysema/COPD (HCC)    GERD (gastroesophageal reflux disease)    Gout    per pt currently stable 11-13-2016   History of kidney stones    History of MRSA infection    right hand abscess 09/ 2016   Hypertension    Left inguinal hernia    PAD (peripheral artery disease) (HCC) first dx 1994---  was followed by vascular dr Madilyn Fireman   s/p  fem-pop bypass graft 1994 and 2001/  [facility] [place] [facility] denies s&s   Rotator cuff syndrome of left shoulder    Wears hearing aid in both ears    Past Surgical History:  Procedure Laterality Date   ANTERIOR CERVICAL DECOMP/DISCECTOMY FUSION  02/1999   AORTA - BILATERAL FEMORAL ARTERY BYPASS GRAFT  2001  dr Madilyn Fireman   APPENDECTOMY  1959   BLADDER SURGERY  11/2022   COLONOSCOPY N/A 02/06/2021   Procedure: COLONOSCOPY;  Surgeon: Malissa Hippo, MD;  [facility];  Service: Endoscopy;  Laterality: N/A;  10:30   CYSTOSCOPY W/ RETROGRADES Bilateral 10/31/2019   Procedure: CYSTOSCOPY WITH BILATERAL RETROGRADE PYELOGRAM;  Surgeon: Bjorn Pippin, MD;  Location: [facility];  Service: Urology;  Laterality: Bilateral;   CYSTOSCOPY WITH BIOPSY Bilateral 12/02/2021   Procedure: CYSTOSCOPY WITH BLADDER BIOPSY AND FULGURATION BILATERAL   RETROGRADES LEFT URETEROSCOPY  left stent placement;  Surgeon: Bjorn Pippin, MD;  Location: [facility];  Service: Urology;  Laterality: Bilateral;  1HR FOR CASE   CYSTOSCOPY WITH STENT PLACEMENT Left 11/26/2016   Procedure: CYSTOSCOPY WITH left double J STENT PLACEMENT;  Surgeon: Bjorn Pippin, MD;  Location: [facility];  Service: Urology;  Laterality: Left;   FEMORAL-POPLITEAL BYPASS GRAFT  1994   dr Madilyn Fireman   I & D EXTREMITY Right 01/09/2015   Procedure: IRRIGATION AND DEBRIDEMENT RIGHT THUMB/HAND ;  Surgeon: Dominica Severin, MD;  Location: [facility];  Service: Orthopedics;  Laterality: Right;   LEFT HEART CATHETERIZATION WITH CORONARY ANGIOGRAM N/A 03/06/2014   Procedure: LEFT HEART CATHETERIZATION WITH CORONARY ANGIOGRAM;  Surgeon: Peter M Swaziland, MD;  Location: [facility] CATH LAB;  Service: Cardiovascular;  Laterality: N/A;  midLAD 50-60%; D1 20-30%; mCFX 30%; pRCA 30%;  ef 55-65%   LUMBAR SPINE SURGERY  1980's   REVERSE SHOULDER ARTHROPLASTY Right 03/11/2023   Procedure: RIGHT REVERSE SHOULDER ARTHROPLASTY;  Surgeon: Francena Hanly, MD;  Location: [facility];  Service: Orthopedics;  Laterality: Right;    TRANSURETHRAL RESECTION OF BLADDER TUMOR N/A 11/26/2016   Procedure: TRANSURETHRAL RESECTION OF BLADDER TUMOR (TURBT)/ left ureteroscopy;  Surgeon: Annabell Howells,  Jonny Ruiz, MD;  Location[facility];  Service: Urology;  Laterality: N/A;   TRANSURETHRAL RESECTION OF BLADDER TUMOR WITH MITOMYCIN-C N/A 10/31/2019   Procedure: TRANSURETHRAL RESECTION OF BLADDER TUMOR WITH GEMCITABINE;  Surgeon: Bjorn Pippin, MD;  [facility]: [facility];  Service: Urology;  Laterality: N/A;   Patient Active Problem List   Diagnosis Date Noted   COPD GOLD II  11/12/2017   Primary osteoarthritis, left shoulder 01/06/2017   CAD in native artery 03/28/2014   Hyperlipidemia LDL goal <70 03/28/2014   Essential hypertension    REFERRING PROVIDER: Francena Hanly MD  REFERRING DIAG: S/p right total shoulder  replacement  THERAPY DIAG:  Chronic right shoulder pain  Stiffness of right shoulder, not elsewhere classified  Rationale for Evaluation and Treatment: Rehabilitation  ONSET DATE: 03/11/23 (surgery date).  SUBJECTIVE:                                                                                                                                                                                      SUBJECTIVE STATEMENT: The patient reports doing good RT shldr today 3/10   PERTINENT HISTORY: See above.  PAIN:  Are you having pain? Yes: NPRS scale: 3/10 Pain location: Right shoulder. Pain description: Ache. Aggravating factors: Certain movements. Relieving factors: Rest.  PRECAUTIONS: Other: Progress per protocol.   No ultrasound.   WEIGHT BEARING RESTRICTIONS:  No right UE weight bearing.  FALLS:  Has patient fallen in last 6 months? No  LIVING ENVIRONMENT: Lives with: lives with their spouse Lives in: House/apartment Has following equipment at home: None  OCCUPATION: Retired.  PLOF: Independent  PATIENT GOALS:Use right UE without pain.  NEXT MD VISIT:   OBJECTIVE:  Note: Objective measures were completed at Evaluation unless otherwise noted.  PATIENT SURVEYS:  FOTO 49.29.  UPPER EXTREMITY ROM:   In supine:  Gentle right shoulder passive flexion to 75 degrees and ER is 10 degrees.  PALPATION:  Minimal c/o right anterior shoulder pain.  His incisional site appears to be healing well.   TODAY'S TREATMENT:  DATE:                                                                               04/05/2023                                    EXERCISE LOG    RT shldr  Exercise Repetitions and Resistance Comments  Pulleys X 5 mins   UE Ranger X 6 mins                Blank cell = exercise not performed today  Manual PROM /  AAROM RT shldr flexion and ER    Vasopneumatic on low x 15 minutes to patient's right shoulder with pillow between right elbow and thorax.   PATIENT EDUCATION: Education details: Discussed progression per protocol and instruct in home exercise below. Person educated: Patient Education method: Explanation, demo, handout Education comprehension: verbalized understanding  HOME EXERCISE PROGRAM: HOME EXERCISE PROGRAM Created by [place] Applegate Dec 10th, 2024 View at [email or web address] using code: [email or web address]  Page 1 of 1 1 Exercise WAND EXTERNAL ROTATION - SUPINE ER Lie on your back holding a cane or wand with both hands.  On the affected side, place a small rolled up towel or pillow under your elbow. Maintain approx. 90 degree bend at the elbow with your arm approximately 30-45 degrees away from your side. GENTLE. PAIN-FREE Use your other arm to pull the wand/cane to rotate the affected arm back into a stretch. Hold and then return to starting position and then repeat. Repeat 10 Times Hold 15 Seconds Complete 1 Set Perform 4 Times a Day  ASSESSMENT:  CLINICAL IMPRESSION: The patient reports RT shldr is doing good and did well after last Rx. He was able to continue with AAROM exs and Manual PROM and did great. Vaso end of session.   OBJECTIVE IMPAIRMENTS: decreased activity tolerance, decreased ROM, and pain.   ACTIVITY LIMITATIONS: carrying, lifting, and reach over head  PARTICIPATION LIMITATIONS: meal prep, cleaning, laundry, and yard work  [facility] POTENTIAL: Excellent  CLINICAL DECISION MAKING: Stable/uncomplicated  EVALUATION COMPLEXITY: Low   GOALS:  SHORT TERM GOALS: Target date: 04/27/23  Ind with an initial HEP. Goal status: INITIAL  2.  Passive right shoulder ER to 45 degrees.  Goal status: INITIAL  3.  Passive right shoulder flexion to 115 degrees.  Goal status: INITIAL   LONG TERM GOALS: Target date: 05/25/23.  Ind with an advanced HEP.  Goal  status: INITIAL  2.  Active right shoulder flexion to 135 degrees so the patient can easily reach overhead.  Goal status: INITIAL  3.  Active ER to 60 degrees+ to allow for easily donning/doffing of apparel.  Goal status: INITIAL  4.  Perform ADL's with right shoulder pain not > 2-3/10.  Goal status: INITIAL  5.  Increase right shoulder strength to a solid 4/5 to increase stability for performance of functional activities.  Goal status: INITIAL PLAN:  PT FREQUENCY: 2x/week  PT DURATION: 4 weeks  PLANNED INTERVENTIONS: 97110-Therapeutic exercises, 97530- Therapeutic activity, [id number]- Neuromuscular re-education, 97535- Self Care, 52841- Manual therapy, 97014- Electrical stimulation (unattended),  08657- Vasopneumatic device, Patient/Family education, Cryotherapy, and Moist heat  PLAN FOR NEXT SESSION: Progress per protocol.  Please review HEP.  Vasopneumatic with pillow between right elbow and thorax.   Stalin Gruenberg,CHRIS, PTA 04/05/2023, 9:26 AM

## 2023-04-08 ENCOUNTER — Ambulatory Visit: Payer: No Typology Code available for payment source | Admitting: *Deleted

## 2023-04-08 ENCOUNTER — Encounter: Payer: Self-pay | Admitting: *Deleted

## 2023-04-08 DIAGNOSIS — G8929 Other chronic pain: Secondary | ICD-10-CM

## 2023-04-08 DIAGNOSIS — M25611 Stiffness of right shoulder, not elsewhere classified: Secondary | ICD-10-CM

## 2023-04-08 NOTE — Therapy (Signed)
OUTPATIENT PHYSICAL THERAPY SHOULDER TREATMENT   Patient Name: Manuel Barrett MRN: [id number] DOB:1945/11/02, 77 y.o., male Today's Date: 04/08/2023  END OF SESSION:  PT End of Session - 04/08/23 0757     Visit Number 4    Number of Visits 8    Date for PT Re-Evaluation 04/27/23    Authorization Type FOTO.    PT Start Time 0757    PT Stop Time 0850    PT Time Calculation (min) 53 min             Past Medical History:  Diagnosis Date   Arthritis    Bladder tumor    CAD in native artery followed by pcp at the [place] in [place] 11-13-2016 pt denies S&S   per cardiac cath 03-06-2014  by dr [place]--  moderate non-obstructive cad   Cancer [facility])    bladder   Chronic kidney disease    Emphysema/COPD (HCC)    GERD (gastroesophageal reflux disease)    Gout    per pt currently stable 11-13-2016   History of kidney stones    History of MRSA infection    right hand abscess 09/ 2016   Hypertension    Left inguinal hernia    PAD (peripheral artery disease) (HCC) first dx 1994---  was followed by vascular dr Madilyn Fireman   s/p  fem-pop bypass graft 1994 and 2001/  [facility] [place] [facility]2016 denies s&s   Rotator cuff syndrome of left shoulder    Wears hearing aid in both ears    Past Surgical History:  Procedure Laterality Date   ANTERIOR CERVICAL DECOMP/DISCECTOMY FUSION  02/1999   AORTA - BILATERAL FEMORAL ARTERY BYPASS GRAFT  2001  dr Madilyn Fireman   APPENDECTOMY  1959   BLADDER SURGERY  11/2022   COLONOSCOPY N/A 02/06/2021   Procedure: COLONOSCOPY;  Surgeon: Malissa Hippo, MD;  [facility];  Service: Endoscopy;  Laterality: N/A;  10:30   CYSTOSCOPY W/ RETROGRADES Bilateral 10/31/2019   Procedure: CYSTOSCOPY WITH BILATERAL RETROGRADE PYELOGRAM;  Surgeon: Bjorn Pippin, MD;  [facility];  Service: Urology;  Laterality: Bilateral;   CYSTOSCOPY WITH BIOPSY Bilateral 12/02/2021   Procedure: CYSTOSCOPY WITH BLADDER BIOPSY AND FULGURATION BILATERAL   RETROGRADES LEFT URETEROSCOPY  left stent placement;  Surgeon: Bjorn Pippin, MD;  [facility];  Service: Urology;  Laterality: Bilateral;  1HR FOR CASE   CYSTOSCOPY WITH STENT PLACEMENT Left 11/26/2016   Procedure: CYSTOSCOPY WITH left double J STENT PLACEMENT;  Surgeon: Bjorn Pippin, MD;  [facility];  Service: Urology;  Laterality: Left;   FEMORAL-POPLITEAL BYPASS GRAFT  1994   dr Madilyn Fireman   I & D EXTREMITY Right 01/09/2015   Procedure: IRRIGATION AND DEBRIDEMENT RIGHT THUMB/HAND ;  Surgeon: Dominica Severin, MD;  [facility];  Service: Orthopedics;  Laterality: Right;   LEFT HEART CATHETERIZATION WITH CORONARY ANGIOGRAM N/A 03/06/2014   Procedure: LEFT HEART CATHETERIZATION WITH CORONARY ANGIOGRAM;  Surgeon: Peter M Swaziland, MD;  Location: [facility] CATH LAB;  Service: Cardiovascular;  Laterality: N/A;  midLAD 50-60%; D1 20-30%; mCFX 30%; pRCA 30%;  ef 55-65%   LUMBAR SPINE SURGERY  1980's   REVERSE SHOULDER ARTHROPLASTY Right 03/11/2023   Procedure: RIGHT REVERSE SHOULDER ARTHROPLASTY;  Surgeon: Francena Hanly, MD;  [facility]: [facility];  Service: Orthopedics;  Laterality: Right;    TRANSURETHRAL RESECTION OF BLADDER TUMOR N/A 11/26/2016   Procedure: TRANSURETHRAL RESECTION OF BLADDER TUMOR (TURBT)/ left ureteroscopy;  Surgeon: Annabell Howells,  Jonny Ruiz, MD;  Location: [facility];  Service: Urology;  Laterality: N/A;   TRANSURETHRAL RESECTION OF BLADDER TUMOR WITH MITOMYCIN-C N/A 10/31/2019   Procedure: TRANSURETHRAL RESECTION OF BLADDER TUMOR WITH GEMCITABINE;  Surgeon: Bjorn Pippin, MD;  [facility];  Service: Urology;  Laterality: N/A;   Patient Active Problem List   Diagnosis Date Noted   COPD GOLD II  11/12/2017   Primary osteoarthritis, left shoulder 01/06/2017   CAD in native artery 03/28/2014   Hyperlipidemia LDL goal <70 03/28/2014   Essential hypertension    REFERRING PROVIDER: Francena Hanly MD  REFERRING DIAG: S/p right total shoulder  replacement  THERAPY DIAG:  Chronic right shoulder pain  Stiffness of right shoulder, not elsewhere classified  Rationale for Evaluation and Treatment: Rehabilitation  ONSET DATE: 03/11/23 (surgery date).  SUBJECTIVE:                                                                                                                                                                                      SUBJECTIVE STATEMENT: The patient reports doing good RT shldr today 3/10. Doing okay. [facility] To MD 04-27-22   PERTINENT HISTORY: See above.  PAIN:  Are you having pain? Yes: NPRS scale: 3/10 Pain location: Right shoulder. Pain description: Ache. Aggravating factors: Certain movements. Relieving factors: Rest.  PRECAUTIONS: Other: Progress per protocol.   No ultrasound.   WEIGHT BEARING RESTRICTIONS:  No right UE weight bearing.  FALLS:  Has patient fallen in last 6 months? No  LIVING ENVIRONMENT: Lives with: lives with their spouse Lives in: House/apartment Has following equipment at home: None  OCCUPATION: Retired.  PLOF: Independent  PATIENT GOALS:Use right UE without pain.  NEXT MD VISIT:   OBJECTIVE:  Note: Objective measures were completed at Evaluation unless otherwise noted.  PATIENT SURVEYS:  FOTO 49.29.  UPPER EXTREMITY ROM:   In supine:  Gentle right shoulder passive flexion to 75 degrees and ER is 10 degrees.  PALPATION:  Minimal c/o right anterior shoulder pain.  His incisional site appears to be healing well.   TODAY'S TREATMENT:  DATE:                                                                               04/05/2023                                    EXERCISE LOG    RT shldr        4 weeks p/o 04-08-23  Exercise Repetitions and Resistance Comments  Pulleys X 5 mins   UE Ranger X 6 mins                 Blank cell = exercise not performed today  Manual PROM / AAROM RT shldr flexion and ER.   Flexion to 120 degrees and ER to 55 degrees    Vasopneumatic on low x 15 minutes to patient's right shoulder with pillow between right elbow and thorax.   PATIENT EDUCATION: Education details: Discussed progression per protocol and instruct in home exercise below. Person educated: Patient Education method: Explanation, demo, handout Education comprehension: verbalized understanding  HOME EXERCISE PROGRAM: HOME EXERCISE PROGRAM Created by [place] Applegate Dec 10th, 2024 View at [email or web address] using code: [email or web address]  Page 1 of 1 1 Exercise WAND EXTERNAL ROTATION - SUPINE ER Lie on your back holding a cane or wand with both hands.  On the affected side, place a small rolled up towel or pillow under your elbow. Maintain approx. 90 degree bend at the elbow with your arm approximately 30-45 degrees away from your side. GENTLE. PAIN-FREE Use your other arm to pull the wand/cane to rotate the affected arm back into a stretch. Hold and then return to starting position and then repeat. Repeat 10 Times Hold 15 Seconds Complete 1 Set Perform 4 Times a Day  ASSESSMENT:  CLINICAL IMPRESSION: The patient reports RT shldr is doing good and did well after last Rx and soreness stays 2-3/10. He was able to continue with AAROM exs with added wall climbs for HEP.  Manual PROM Flexion to 120 degrees and ER to 55 degrees. Vaso end of session. STG'd met today   OBJECTIVE IMPAIRMENTS: decreased activity tolerance, decreased ROM, and pain.   ACTIVITY LIMITATIONS: carrying, lifting, and reach over head  PARTICIPATION LIMITATIONS: meal prep, cleaning, laundry, and yard work  [facility] POTENTIAL: Excellent  CLINICAL DECISION MAKING: Stable/uncomplicated  EVALUATION COMPLEXITY: Low   GOALS:  SHORT TERM GOALS: Target date: 04/27/23  Ind with an initial HEP. Goal status: MET  2.   Passive right shoulder ER to 45 degrees.  Goal status: MET  3.  Passive right shoulder flexion to 115 degrees.  Goal status: MET   LONG TERM GOALS: Target date: 05/25/23.  Ind with an advanced HEP.  Goal status: INITIAL  2.  Active right shoulder flexion to 135 degrees so the patient can easily reach overhead.  Goal status: INITIAL  3.  Active ER to 60 degrees+ to allow for easily donning/doffing of apparel.  Goal status: INITIAL  4.  Perform ADL's with right shoulder pain not > 2-3/10.  Goal status: INITIAL  5.  Increase right shoulder strength to a solid 4/5 to increase  stability for performance of functional activities.  Goal status: INITIAL PLAN:  PT FREQUENCY: 2x/week  PT DURATION: 4 weeks  PLANNED INTERVENTIONS: 97110-Therapeutic exercises, 97530- Therapeutic activity, [id number]- Neuromuscular re-education, 97535- Self Care, 24401- Manual therapy, 97014- Electrical stimulation (unattended), 97016- Vasopneumatic device, Patient/Family education, Cryotherapy, and Moist heat  PLAN FOR NEXT SESSION: Progress per protocol.  Please review HEP.  Vasopneumatic with pillow between right elbow and thorax.   Nicolis Boody,CHRIS, PTA 04/08/2023, 11:14 AM

## 2023-04-15 ENCOUNTER — Ambulatory Visit: Payer: No Typology Code available for payment source

## 2023-04-15 DIAGNOSIS — G8929 Other chronic pain: Secondary | ICD-10-CM | POA: Diagnosis not present

## 2023-04-15 DIAGNOSIS — M25611 Stiffness of right shoulder, not elsewhere classified: Secondary | ICD-10-CM

## 2023-04-15 NOTE — Therapy (Signed)
OUTPATIENT PHYSICAL THERAPY SHOULDER TREATMENT   Patient Name: Manuel Barrett MRN: [id number] DOB:May 22, 1945, 77 y.o., male Today's Date: 04/15/2023  END OF SESSION:  PT End of Session - 04/15/23 0802     Visit Number 5    Number of Visits 8    Date for PT Re-Evaluation 04/27/23    Authorization Type FOTO.    PT Start Time 0800    PT Stop Time 0856    PT Time Calculation (min) 56 min    Activity Tolerance Patient tolerated treatment well    Behavior During Therapy [facility] for tasks assessed/performed              Past Medical History:  Diagnosis Date   Arthritis    Bladder tumor    CAD in native artery followed by pcp at the [place] in [place] 11-13-2016 pt denies S&S   per cardiac cath 03-06-2014  by dr Swaziland--  moderate non-obstructive cad   Cancer [facility])    bladder   Chronic kidney disease    Emphysema/COPD (HCC)    GERD (gastroesophageal reflux disease)    Gout    per pt currently stable 11-13-2016   History of kidney stones    History of MRSA infection    right hand abscess 09/ 2016   Hypertension    Left inguinal hernia    PAD (peripheral artery disease) (HCC) first dx 1994---  was followed by vascular dr Madilyn Fireman   s/p  fem-pop bypass graft 1994 and 2001/  [facility] [place] [facility]2016 denies s&s   Rotator cuff syndrome of left shoulder    Wears hearing aid in both ears    Past Surgical History:  Procedure Laterality Date   ANTERIOR CERVICAL DECOMP/DISCECTOMY FUSION  02/1999   AORTA - BILATERAL FEMORAL ARTERY BYPASS GRAFT  2001  dr Madilyn Fireman   APPENDECTOMY  1959   BLADDER SURGERY  11/2022   COLONOSCOPY N/A 02/06/2021   Procedure: COLONOSCOPY;  Surgeon: Malissa Hippo, MD;  [facility];  Service: Endoscopy;  Laterality: N/A;  10:30   CYSTOSCOPY W/ RETROGRADES Bilateral 10/31/2019   Procedure: CYSTOSCOPY WITH BILATERAL RETROGRADE PYELOGRAM;  Surgeon: Bjorn Pippin, MD;  [facility]: [facility];  Service: Urology;  Laterality: Bilateral;    CYSTOSCOPY WITH BIOPSY Bilateral 12/02/2021   Procedure: CYSTOSCOPY WITH BLADDER BIOPSY AND FULGURATION BILATERAL  RETROGRADES LEFT URETEROSCOPY  left stent placement;  Surgeon: Bjorn Pippin, MD;  [facility]: [facility];  Service: Urology;  Laterality: Bilateral;  1HR FOR CASE   CYSTOSCOPY WITH STENT PLACEMENT Left 11/26/2016   Procedure: CYSTOSCOPY WITH left double J STENT PLACEMENT;  Surgeon: Bjorn Pippin, MD;  Location: [facility];  Service: Urology;  Laterality: Left;   FEMORAL-POPLITEAL BYPASS GRAFT  1994   dr Madilyn Fireman   I & D EXTREMITY Right 01/09/2015   Procedure: IRRIGATION AND DEBRIDEMENT RIGHT THUMB/HAND ;  Surgeon: Dominica Severin, MD;  [facility];  Service: Orthopedics;  Laterality: Right;   LEFT HEART CATHETERIZATION WITH CORONARY ANGIOGRAM N/A 03/06/2014   Procedure: LEFT HEART CATHETERIZATION WITH CORONARY ANGIOGRAM;  Surgeon: Peter M Swaziland, MD;  [facility] LAB;  Service: Cardiovascular;  Laterality: N/A;  midLAD 50-60%; D1 20-30%; mCFX 30%; pRCA 30%;  ef 55-65%   LUMBAR SPINE SURGERY  1980's   REVERSE SHOULDER ARTHROPLASTY Right 03/11/2023   Procedure: RIGHT REVERSE SHOULDER ARTHROPLASTY;  Surgeon: Francena Hanly, MD;  [facility];  Service: Orthopedics;  Laterality: Right;    TRANSURETHRAL  RESECTION OF BLADDER TUMOR N/A 11/26/2016   Procedure: TRANSURETHRAL RESECTION OF BLADDER TUMOR (TURBT)/ left ureteroscopy;  Surgeon: Bjorn Pippin, MD;  Location: [facility];  Service: Urology;  Laterality: N/A;   TRANSURETHRAL RESECTION OF BLADDER TUMOR WITH MITOMYCIN-C N/A 10/31/2019   Procedure: TRANSURETHRAL RESECTION OF BLADDER TUMOR WITH GEMCITABINE;  Surgeon: Bjorn Pippin, MD;  [facility];  Service: Urology;  Laterality: N/A;   Patient Active Problem List   Diagnosis Date Noted   COPD GOLD II  11/12/2017   Primary osteoarthritis, left shoulder 01/06/2017   CAD in native artery 03/28/2014   Hyperlipidemia LDL goal <70 03/28/2014    Essential hypertension    REFERRING PROVIDER: Francena Hanly MD  REFERRING DIAG: S/p right total shoulder replacement  THERAPY DIAG:  Chronic right shoulder pain  Stiffness of right shoulder, not elsewhere classified  Rationale for Evaluation and Treatment: Rehabilitation  ONSET DATE: 03/11/23 (surgery date).  SUBJECTIVE:                                                                                                                                                                                      SUBJECTIVE STATEMENT: Patient reports that his shoulder is a little sore this morning, but the more he uses his arm the better it feels.   PERTINENT HISTORY: See above.  PAIN:  Are you having pain? Yes: NPRS scale: 1-2/10 Pain location: Right shoulder. Pain description: Ache. Aggravating factors: Certain movements. Relieving factors: Rest.  PRECAUTIONS: Other: Progress per protocol.   No ultrasound.   WEIGHT BEARING RESTRICTIONS:  No right UE weight bearing.  FALLS:  Has patient fallen in last 6 months? No  LIVING ENVIRONMENT: Lives with: lives with their spouse Lives in: House/apartment Has following equipment at home: None  OCCUPATION: Retired.  PLOF: Independent  PATIENT GOALS:Use right UE without pain.  NEXT MD VISIT:   OBJECTIVE:  Note: Objective measures were completed at Evaluation unless otherwise noted.  PATIENT SURVEYS:  FOTO 49.29.  UPPER EXTREMITY ROM:   In supine:  Gentle right shoulder passive flexion to 75 degrees and ER is 10 degrees.  PALPATION:  Minimal c/o right anterior shoulder pain.  His incisional site appears to be healing well.   TODAY'S TREATMENT:  DATE:                                        04/15/23 EXERCISE LOG  Exercise Repetitions and Resistance Comments  Pulleys 5.5 minutes Flexion  UE  ranger (seated)  5.5 minutes AP and circles  Wall ladder  2.5 minutes Max #30   R shoulder isometric 2 minutes w/ 5 second hold each  Flexion, abduction, ER, IR   Resisted row  Red t-band x 3 minutes   AA R shoulder ER  2 minutes  With cane  Wall slides  10 reps each  Flexion, scaption, and abduction   Blank cell = exercise not performed today    04/05/2023 EXERCISE LOG    RT shldr        4 weeks p/o 04-08-23  Exercise Repetitions and Resistance Comments  Pulleys X 5 mins   UE Ranger X 6 mins                Blank cell = exercise not performed today  Manual PROM / AAROM RT shldr flexion and ER.   Flexion to 120 degrees and ER to 55 degrees    Vasopneumatic on low x 15 minutes to patient's right shoulder with pillow between right elbow and thorax.   PATIENT EDUCATION: Education details: healing, prognosis, expectation for soreness, HEP, and surgical condition/ activity modification  Person educated: Patient Education method: Explanation Education comprehension: verbalized understanding  HOME EXERCISE PROGRAM: [id number]  ASSESSMENT:  CLINICAL IMPRESSION: Patient was introduced to multiple new interventions for improved isometric muscular strength. He required minimal cueing with today's new isometric interventions for proper exercise performance to prevent compensatory movement patterns. He exhibited increased right upper extremity fatigue as these interventions progressed. However, this did not inhibit his ability to complete any of today's interventions. He was educated on the expectation for muscular soreness following these interventions and his HEP. He reported understanding and feeling comfortable performing these interventions at home. He reported that his shoulder felt better upon the conclusion of treatment. He continues to require skilled physical therapy to address his remaining impairments to return to his prior level of function.   OBJECTIVE IMPAIRMENTS: decreased  activity tolerance, decreased ROM, and pain.   ACTIVITY LIMITATIONS: carrying, lifting, and reach over head  PARTICIPATION LIMITATIONS: meal prep, cleaning, laundry, and yard work  [facility] POTENTIAL: Excellent  CLINICAL DECISION MAKING: Stable/uncomplicated  EVALUATION COMPLEXITY: Low   GOALS:  SHORT TERM GOALS: Target date: 04/27/23  Ind with an initial HEP. Goal status: MET  2.  Passive right shoulder ER to 45 degrees.  Goal status: MET  3.  Passive right shoulder flexion to 115 degrees.  Goal status: MET   LONG TERM GOALS: Target date: 05/25/23.  Ind with an advanced HEP.  Goal status: INITIAL  2.  Active right shoulder flexion to 135 degrees so the patient can easily reach overhead.  Goal status: INITIAL  3.  Active ER to 60 degrees+ to allow for easily donning/doffing of apparel.  Goal status: INITIAL  4.  Perform ADL's with right shoulder pain not > 2-3/10.  Goal status: INITIAL  5.  Increase right shoulder strength to a solid 4/5 to increase stability for performance of functional activities.  Goal status: INITIAL PLAN:  PT FREQUENCY: 2x/week  PT DURATION: 4 weeks  PLANNED INTERVENTIONS: 97110-Therapeutic exercises, 97530- Therapeutic activity, [id number]- Neuromuscular re-education, 97535- Self Care, 13086-  Manual therapy, 97014- Electrical stimulation (unattended), 97016- Vasopneumatic device, Patient/Family education, Cryotherapy, and Moist heat  PLAN FOR NEXT SESSION: Progress per protocol.  Please review HEP.  Vasopneumatic with pillow between right elbow and thorax.   Granville Lewis, PT 04/15/2023, 9:21 AM

## 2023-04-20 ENCOUNTER — Ambulatory Visit: Payer: No Typology Code available for payment source | Admitting: *Deleted

## 2023-04-20 DIAGNOSIS — G8929 Other chronic pain: Secondary | ICD-10-CM | POA: Diagnosis not present

## 2023-04-20 DIAGNOSIS — M25611 Stiffness of right shoulder, not elsewhere classified: Secondary | ICD-10-CM

## 2023-04-20 NOTE — Therapy (Signed)
 OUTPATIENT PHYSICAL THERAPY SHOULDER TREATMENT   Patient Name: Manuel Barrett MRN: [id number] DOB:November 27, 1945, 77 y.o., male Today's Date: 04/20/2023  END OF SESSION:  PT End of Session - 04/20/23 0804     Visit Number 6    Number of Visits 8    Date for PT Re-Evaluation 04/27/23    PT Start Time 0800    PT Stop Time 0847    PT Time Calculation (min) 47 min              Past Medical History:  Diagnosis Date   Arthritis    Bladder tumor    CAD in native artery followed by pcp at the [place] in [place] 11-13-2016 pt denies S&S   per cardiac cath 03-06-2014  by dr jordan--  moderate non-obstructive cad   Cancer [facility])    bladder   Chronic kidney disease    Emphysema/COPD (HCC)    GERD (gastroesophageal reflux disease)    Gout    per pt currently stable 11-13-2016   History of kidney stones    History of MRSA infection    right hand abscess 09/ 2016   Hypertension    Left inguinal hernia    PAD (peripheral artery disease) (HCC) first dx 1994---  was followed by vascular dr dyane   s/p  fem-pop bypass graft 1994 and 2001/  [facility] [place] [facility]2016 denies s&s   Rotator cuff syndrome of left shoulder    Wears hearing aid in both ears    Past Surgical History:  Procedure Laterality Date   ANTERIOR CERVICAL DECOMP/DISCECTOMY FUSION  02/1999   AORTA - BILATERAL FEMORAL ARTERY BYPASS GRAFT  2001  dr dyane   APPENDECTOMY  1959   BLADDER SURGERY  11/2022   COLONOSCOPY N/A 02/06/2021   Procedure: COLONOSCOPY;  Surgeon: Golda Claudis PENNER, MD;  [facility];  Service: Endoscopy;  Laterality: N/A;  10:30   CYSTOSCOPY W/ RETROGRADES Bilateral 10/31/2019   Procedure: CYSTOSCOPY WITH BILATERAL RETROGRADE PYELOGRAM;  Surgeon: Watt Rush, MD;  [facility];  Service: Urology;  Laterality: Bilateral;   CYSTOSCOPY WITH BIOPSY Bilateral 12/02/2021   Procedure: CYSTOSCOPY WITH BLADDER BIOPSY AND FULGURATION BILATERAL  RETROGRADES LEFT  URETEROSCOPY  left stent placement;  Surgeon: Watt Rush, MD;  [facility];  Service: Urology;  Laterality: Bilateral;  1HR FOR CASE   CYSTOSCOPY WITH STENT PLACEMENT Left 11/26/2016   Procedure: CYSTOSCOPY WITH left double J STENT PLACEMENT;  Surgeon: Watt Rush, MD;  Location: [facility];  Service: Urology;  Laterality: Left;   FEMORAL-POPLITEAL BYPASS GRAFT  1994   dr dyane   I & D EXTREMITY Right 01/09/2015   Procedure: IRRIGATION AND DEBRIDEMENT RIGHT THUMB/HAND ;  Surgeon: Elsie Mussel, MD;  [facility];  Service: Orthopedics;  Laterality: Right;   LEFT HEART CATHETERIZATION WITH CORONARY ANGIOGRAM N/A 03/06/2014   Procedure: LEFT HEART CATHETERIZATION WITH CORONARY ANGIOGRAM;  Surgeon: Peter M Jordan, MD;  Location: [facility];  Service: Cardiovascular;  Laterality: N/A;  midLAD 50-60%; D1 20-30%; mCFX 30%; pRCA 30%;  ef 55-65%   LUMBAR SPINE SURGERY  1980's   REVERSE SHOULDER ARTHROPLASTY Right 03/11/2023   Procedure: RIGHT REVERSE SHOULDER ARTHROPLASTY;  Surgeon: Melita Drivers, MD;  [facility];  Service: Orthopedics;  Laterality: Right;    TRANSURETHRAL RESECTION OF BLADDER TUMOR N/A 11/26/2016   Procedure: TRANSURETHRAL RESECTION OF BLADDER TUMOR (TURBT)/ left ureteroscopy;  Surgeon: Watt Rush, MD;  Location: [facility];  Service: Urology;  Laterality: N/A;   TRANSURETHRAL RESECTION OF BLADDER TUMOR WITH MITOMYCIN -C N/A 10/31/2019   Procedure: TRANSURETHRAL RESECTION OF BLADDER TUMOR WITH GEMCITABINE ;  Surgeon: Watt Rush, MD;  [facility];  Service: Urology;  Laterality: N/A;   Patient Active Problem List   Diagnosis Date Noted   COPD GOLD II  11/12/2017   Primary osteoarthritis, left shoulder 01/06/2017   CAD in native artery 03/28/2014   Hyperlipidemia LDL goal <70 03/28/2014   Essential hypertension    REFERRING PROVIDER: Franky Pointer MD  REFERRING DIAG: S/p right total shoulder replacement  THERAPY DIAG:   Chronic right shoulder pain  Stiffness of right shoulder, not elsewhere classified  Rationale for Evaluation and Treatment: Rehabilitation  ONSET DATE: 03/11/23 (surgery date).  SUBJECTIVE:                                                                                                                                                                                      SUBJECTIVE STATEMENT: Patient reports that his shoulder is doing good.  To MD on 04-27-22  PERTINENT HISTORY: See above.  PAIN:  Are you having pain? Yes: NPRS scale: 1-2/10 Pain location: Right shoulder. Pain description: Ache. Aggravating factors: Certain movements. Relieving factors: Rest.  PRECAUTIONS: Other: Progress per protocol.   No ultrasound.   WEIGHT BEARING RESTRICTIONS:  No right UE weight bearing.  FALLS:  Has patient fallen in last 6 months? No  LIVING ENVIRONMENT: Lives with: lives with their spouse Lives in: House/apartment Has following equipment at home: None  OCCUPATION: Retired.  PLOF: Independent  PATIENT GOALS:Use right UE without pain.  NEXT MD VISIT:   OBJECTIVE:  Note: Objective measures were completed at Evaluation unless otherwise noted.  PATIENT SURVEYS:  FOTO 49.29.  UPPER EXTREMITY ROM:   In supine:  Gentle right shoulder passive flexion to 75 degrees and ER is 10 degrees.  PALPATION:  Minimal c/o right anterior shoulder pain.  His incisional site appears to be healing well.   TODAY'S TREATMENT:  DATE:                                        04/20/23 EXERCISE LOG  Exercise Repetitions and Resistance Comments  Pulleys 5 minutes Flexion  UE ranger (seated)  5 minutes AP and circles  Wall ladder   X 10  Max #30   Ball/wall x10   R shoulder isometrics with step-outs  Yellow tband x 10 for IR/ER and punch with arm at side   Flexion,  ER, IR   Resisted row  Red t-band x 3 minutes   AA R shoulder ER  2 minutes  With cane  Supine CW/CCW 3 x 10 each way 1#   Wall slides   Flexion, scaption, and abduction   Blank cell = exercise not performed today   Manual PROM flexion  135  and ER 50 degrees    04/05/2023 EXERCISE LOG    RT shldr        4 weeks p/o 04-08-23  Exercise Repetitions and Resistance Comments  Pulleys X 5 mins   UE Ranger X 6 mins                Blank cell = exercise not performed today  Manual PROM / AAROM RT shldr flexion and ER.   Flexion to 120 degrees and ER to 55 degrees    Vasopneumatic on low x 15 minutes to patient's right shoulder with pillow between right elbow and thorax.   PATIENT EDUCATION: Education details: healing, prognosis, expectation for soreness, HEP, and surgical condition/ activity modification  Person educated: Patient Education method: Explanation Education comprehension: verbalized understanding  HOME EXERCISE PROGRAM: [id number]  ASSESSMENT:  CLINICAL IMPRESSION:  FOTO   performed  Patient  was able to continue with AROM and AAROM as well as isometric strengthening for RT shldr/ UE. Pt continues to progress as per protocol and will be 6 weeks 04-22-23 and will f/u with MD 04-28-23. AROM flexion 95 degrees in standing    OBJECTIVE IMPAIRMENTS: decreased activity tolerance, decreased ROM, and pain.   ACTIVITY LIMITATIONS: carrying, lifting, and reach over head  PARTICIPATION LIMITATIONS: meal prep, cleaning, laundry, and yard work  [facility] POTENTIAL: Excellent  CLINICAL DECISION MAKING: Stable/uncomplicated  EVALUATION COMPLEXITY: Low   GOALS:  SHORT TERM GOALS: Target date: 04/27/23  Ind with an initial HEP. Goal status: MET  2.  Passive right shoulder ER to 45 degrees.  Goal status: MET  3.  Passive right shoulder flexion to 115 degrees.  Goal status: MET   LONG TERM GOALS: Target date: 05/25/23.  Ind with an advanced HEP.  Goal status: On  going  2.  Active right shoulder flexion to 135 degrees so the patient can easily reach overhead.  Goal status:  On going  3.  Active ER to 60 degrees+ to allow for easily donning/doffing of apparel.  Goal status: On going  4.  Perform ADL's with right shoulder pain not > 2-3/10.  Goal status: On going  5.  Increase right shoulder strength to a solid 4/5 to increase stability for performance of functional activities.  Goal status: On going PLAN:  PT FREQUENCY: 2x/week  PT DURATION: 4 weeks  PLANNED INTERVENTIONS: 97110-Therapeutic exercises, 97530- Therapeutic activity, [id number]- Neuromuscular re-education, 97535- Self Care, [id number]- Manual therapy, 97014- Electrical stimulation (unattended), 97016- Vasopneumatic device, Patient/Family education, Cryotherapy, and Moist heat  PLAN FOR NEXT SESSION: Progress per  protocol.  Please review HEP.  Vasopneumatic with pillow between right elbow and thorax.   Orby Tangen,CHRIS, PTA 04/20/2023, 8:48 AM

## 2023-04-22 ENCOUNTER — Ambulatory Visit
Payer: No Typology Code available for payment source | Attending: Reproductive Endocrinology and Infertility | Admitting: *Deleted

## 2023-04-22 DIAGNOSIS — M19011 Primary osteoarthritis, right shoulder: Secondary | ICD-10-CM | POA: Diagnosis not present

## 2023-04-22 DIAGNOSIS — G8929 Other chronic pain: Secondary | ICD-10-CM | POA: Diagnosis present

## 2023-04-22 DIAGNOSIS — M25611 Stiffness of right shoulder, not elsewhere classified: Secondary | ICD-10-CM | POA: Diagnosis not present

## 2023-04-22 DIAGNOSIS — M25511 Pain in right shoulder: Secondary | ICD-10-CM | POA: Diagnosis not present

## 2023-04-22 NOTE — Therapy (Signed)
 OUTPATIENT PHYSICAL THERAPY SHOULDER TREATMENT   Patient Name: Manuel Barrett MRN: [id number] DOB:October 27, 1945, 78 y.o., male Today's Date: 04/22/2023  END OF SESSION:  PT End of Session - 04/22/23 0755     Visit Number 7    Number of Visits 8    Date for PT Re-Evaluation 04/27/23    Authorization Type FOTO.    PT Start Time 0800    PT Stop Time 0850    PT Time Calculation (min) 50 min    Activity Tolerance Patient tolerated treatment well    Behavior During Therapy [facility] for tasks assessed/performed              Past Medical History:  Diagnosis Date   Arthritis    Bladder tumor    CAD in native artery followed by pcp at the [facility] in [place] 11-13-2016 pt denies S&S   per cardiac cath 03-06-2014  by dr jordan--  moderate non-obstructive cad   Cancer [facility])    bladder   Chronic kidney disease    Emphysema/COPD (HCC)    GERD (gastroesophageal reflux disease)    Gout    per pt currently stable 11-13-2016   History of kidney stones    History of MRSA infection    right hand abscess 09/ 2016   Hypertension    Left inguinal hernia    PAD (peripheral artery disease) (HCC) first dx 1994---  was followed by vascular dr dyane   s/p  fem-pop bypass graft 1994 and 2001/  [facility] [place] [facility]2016 denies s&s   Rotator cuff syndrome of left shoulder    Wears hearing aid in both ears    Past Surgical History:  Procedure Laterality Date   ANTERIOR CERVICAL DECOMP/DISCECTOMY FUSION  02/1999   AORTA - BILATERAL FEMORAL ARTERY BYPASS GRAFT  2001  dr dyane   APPENDECTOMY  1959   BLADDER SURGERY  11/2022   COLONOSCOPY N/A 02/06/2021   Procedure: COLONOSCOPY;  Surgeon: Golda Claudis PENNER, MD;  [facility];  Service: Endoscopy;  Laterality: N/A;  10:30   CYSTOSCOPY W/ RETROGRADES Bilateral 10/31/2019   Procedure: CYSTOSCOPY WITH BILATERAL RETROGRADE PYELOGRAM;  Surgeon: Watt Rush, MD;  [facility]: [facility];  Service: Urology;  Laterality: Bilateral;    CYSTOSCOPY WITH BIOPSY Bilateral 12/02/2021   Procedure: CYSTOSCOPY WITH BLADDER BIOPSY AND FULGURATION BILATERAL  RETROGRADES LEFT URETEROSCOPY  left stent placement;  Surgeon: Watt Rush, MD;  [facility]: [facility];  Service: Urology;  Laterality: Bilateral;  1HR FOR CASE   CYSTOSCOPY WITH STENT PLACEMENT Left 11/26/2016   Procedure: CYSTOSCOPY WITH left double J STENT PLACEMENT;  Surgeon: Watt Rush, MD;  [facility];  Service: Urology;  Laterality: Left;   FEMORAL-POPLITEAL BYPASS GRAFT  1994   dr dyane   I & D EXTREMITY Right 01/09/2015   Procedure: IRRIGATION AND DEBRIDEMENT RIGHT THUMB/HAND ;  Surgeon: Elsie Mussel, MD;  [facility]: [facility];  Service: Orthopedics;  Laterality: Right;   LEFT HEART CATHETERIZATION WITH CORONARY ANGIOGRAM N/A 03/06/2014   Procedure: LEFT HEART CATHETERIZATION WITH CORONARY ANGIOGRAM;  Surgeon: Peter M Jordan, MD;  Location: [facility] CATH LAB;  Service: Cardiovascular;  Laterality: N/A;  midLAD 50-60%; D1 20-30%; mCFX 30%; pRCA 30%;  ef 55-65%   LUMBAR SPINE SURGERY  1980's   REVERSE SHOULDER ARTHROPLASTY Right 03/11/2023   Procedure: RIGHT REVERSE SHOULDER ARTHROPLASTY;  Surgeon: Melita Drivers, MD;  [facility];  Service: Orthopedics;  Laterality: Right;    TRANSURETHRAL  RESECTION OF BLADDER TUMOR N/A 11/26/2016   Procedure: TRANSURETHRAL RESECTION OF BLADDER TUMOR (TURBT)/ left ureteroscopy;  Surgeon: Watt Rush, MD;  Location: [facility];  Service: Urology;  Laterality: N/A;   TRANSURETHRAL RESECTION OF BLADDER TUMOR WITH MITOMYCIN -C N/A 10/31/2019   Procedure: TRANSURETHRAL RESECTION OF BLADDER TUMOR WITH GEMCITABINE ;  Surgeon: Watt Rush, MD;  [facility];  Service: Urology;  Laterality: N/A;   Patient Active Problem List   Diagnosis Date Noted   COPD GOLD II  11/12/2017   Primary osteoarthritis, left shoulder 01/06/2017   CAD in native artery 03/28/2014   Hyperlipidemia LDL goal <70 03/28/2014    Essential hypertension    REFERRING PROVIDER: Franky Pointer MD  REFERRING DIAG: S/p right total shoulder replacement  THERAPY DIAG:  Chronic right shoulder pain  Stiffness of right shoulder, not elsewhere classified  Rationale for Evaluation and Treatment: Rehabilitation  ONSET DATE: 03/11/23 (surgery date).  SUBJECTIVE:                                                                                                                                                                                      SUBJECTIVE STATEMENT: Patient reports that his shoulder is doing good  2-3/10 soreness.  To MD on 04-27-22  PERTINENT HISTORY: See above.  PAIN:  Are you having pain? Yes: NPRS scale: 1-2/10 Pain location: Right shoulder. Pain description: Ache. Aggravating factors: Certain movements. Relieving factors: Rest.  PRECAUTIONS: Other: Progress per protocol.   No ultrasound.   WEIGHT BEARING RESTRICTIONS:  No right UE weight bearing.  FALLS:  Has patient fallen in last 6 months? No  LIVING ENVIRONMENT: Lives with: lives with their spouse Lives in: House/apartment Has following equipment at home: None  OCCUPATION: Retired.  PLOF: Independent  PATIENT GOALS:Use right UE without pain.  NEXT MD VISIT:   OBJECTIVE:  Note: Objective measures were completed at Evaluation unless otherwise noted.  PATIENT SURVEYS:  FOTO 49.29.  UPPER EXTREMITY ROM:   In supine:  Gentle right shoulder passive flexion to 75 degrees and ER is 10 degrees.  PALPATION:  Minimal c/o right anterior shoulder pain.  His incisional site appears to be healing well.   TODAY'S TREATMENT:  DATE:                                        04-22-23 EXERCISE LOG    RT shldr  Exercise Repetitions and Resistance Comments  Pulleys 5 minutes Flexion  UE ranger (seated)  5 minutes  AP and circles  Wall ladder   X 10  Max #30   Ball/wall x10   R shoulder isometrics with step-outs  Yellow tband x 20 for IR/ER and punch with arm at side  Flexion,  ER, IR   Resisted row  Red t-band x 3 minutes   AAROM  shoulder flexion standing X 10 manual assist With cane  Supine CW/CCW 3 x 10 each way 1#   Wall slides   Flexion, scaption, and abduction   Blank cell = exercise not performed today   Manual PROM flexion  135  and ER 50 degrees    04/05/2023 EXERCISE LOG    RT shldr        4 weeks p/o 04-08-23  Exercise Repetitions and Resistance Comments  Pulleys X 5 mins   UE Ranger X 6 mins                Blank cell = exercise not performed today  Manual PROM / AAROM RT shldr flexion and ER.   Flexion to 120 degrees and ER to 55 degrees    Vasopneumatic on low x 15 minutes to patient's right shoulder with pillow between right elbow and thorax.   PATIENT EDUCATION: Education details: healing, prognosis, expectation for soreness, HEP, and surgical condition/ activity modification  Person educated: Patient Education method: Explanation Education comprehension: verbalized understanding  HOME EXERCISE PROGRAM: [id number]  ASSESSMENT:  CLINICAL IMPRESSION:  Patient arrived today doing fairly well RT shldr 2-3/10 soreness.Rx focused on  AROM and AAROM as well as isometric strengthening for RT shldr/ UE with step outs. Pt continues to progress as per protocol and will be 6 weeks 04-22-23 and will f/u with MD 04-28-23.     OBJECTIVE IMPAIRMENTS: decreased activity tolerance, decreased ROM, and pain.   ACTIVITY LIMITATIONS: carrying, lifting, and reach over head  PARTICIPATION LIMITATIONS: meal prep, cleaning, laundry, and yard work  [facility] POTENTIAL: Excellent  CLINICAL DECISION MAKING: Stable/uncomplicated  EVALUATION COMPLEXITY: Low   GOALS:  SHORT TERM GOALS: Target date: 04/27/23  Ind with an initial HEP. Goal status: MET  2.  Passive right shoulder ER to 45  degrees.  Goal status: MET  3.  Passive right shoulder flexion to 115 degrees.  Goal status: MET   LONG TERM GOALS: Target date: 05/25/23.  Ind with an advanced HEP.  Goal status: On going  2.  Active right shoulder flexion to 135 degrees so the patient can easily reach overhead.  Goal status:  On going  3.  Active ER to 60 degrees+ to allow for easily donning/doffing of apparel.  Goal status: On going  4.  Perform ADL's with right shoulder pain not > 2-3/10.  Goal status: On going  5.  Increase right shoulder strength to a solid 4/5 to increase stability for performance of functional activities.  Goal status: On going PLAN:  PT FREQUENCY: 2x/week  PT DURATION: 4 weeks  PLANNED INTERVENTIONS: 97110-Therapeutic exercises, 97530- Therapeutic activity, [id number]- Neuromuscular re-education, 97535- Self Care, [id number]- Manual therapy, 97014- Electrical stimulation (unattended), 97016- Vasopneumatic device, Patient/Family education, Cryotherapy, and Moist heat  PLAN  FOR NEXT SESSION:    MD note next visit Progress per protocol.  Please review HEP.  Vasopneumatic with pillow between right elbow and thorax.   Kandiss Ihrig,CHRIS, PTA 04/22/2023, 8:54 AM

## 2023-04-26 ENCOUNTER — Ambulatory Visit: Payer: No Typology Code available for payment source | Admitting: Physical Therapy

## 2023-04-26 DIAGNOSIS — M25611 Stiffness of right shoulder, not elsewhere classified: Secondary | ICD-10-CM

## 2023-04-26 DIAGNOSIS — G8929 Other chronic pain: Secondary | ICD-10-CM | POA: Diagnosis not present

## 2023-04-26 NOTE — Therapy (Signed)
 OUTPATIENT PHYSICAL THERAPY SHOULDER TREATMENT   Patient Name: Manuel Barrett MRN: [id number] DOB:1945/06/13, 77 y.o., male Today's Date: 04/26/2023  END OF SESSION:  PT End of Session - 04/26/23 0919     Visit Number 8    Number of Visits 14    Date for PT Re-Evaluation 05/25/23    Authorization Type FOTO.    PT Start Time 0802    PT Stop Time 0839    PT Time Calculation (min) 37 min    Activity Tolerance Patient tolerated treatment well    Behavior During Therapy [facility] for tasks assessed/performed               Past Medical History:  Diagnosis Date   Arthritis    Bladder tumor    CAD in native artery followed by pcp at the [facility] in [place] 11-13-2016 pt denies S&S   per cardiac cath 03-06-2014  by dr jordan--  moderate non-obstructive cad   Cancer [facility])    bladder   Chronic kidney disease    Emphysema/COPD (HCC)    GERD (gastroesophageal reflux disease)    Gout    per pt currently stable 11-13-2016   History of kidney stones    History of MRSA infection    right hand abscess 09/ 2016   Hypertension    Left inguinal hernia    PAD (peripheral artery disease) (HCC) first dx 1994---  was followed by vascular dr dyane   s/p  fem-pop bypass graft 1994 and 2001/  [facility] [place] [facility]2016 denies s&s   Rotator cuff syndrome of left shoulder    Wears hearing aid in both ears    Past Surgical History:  Procedure Laterality Date   ANTERIOR CERVICAL DECOMP/DISCECTOMY FUSION  02/1999   AORTA - BILATERAL FEMORAL ARTERY BYPASS GRAFT  2001  dr dyane   APPENDECTOMY  1959   BLADDER SURGERY  11/2022   COLONOSCOPY N/A 02/06/2021   Procedure: COLONOSCOPY;  Surgeon: Golda Claudis PENNER, MD;  [facility];  Service: Endoscopy;  Laterality: N/A;  10:30   CYSTOSCOPY W/ RETROGRADES Bilateral 10/31/2019   Procedure: CYSTOSCOPY WITH BILATERAL RETROGRADE PYELOGRAM;  Surgeon: Watt Rush, MD;  [facility];  Service: Urology;  Laterality: Bilateral;    CYSTOSCOPY WITH BIOPSY Bilateral 12/02/2021   Procedure: CYSTOSCOPY WITH BLADDER BIOPSY AND FULGURATION BILATERAL  RETROGRADES LEFT URETEROSCOPY  left stent placement;  Surgeon: Watt Rush, MD;  [facility];  Service: Urology;  Laterality: Bilateral;  1HR FOR CASE   CYSTOSCOPY WITH STENT PLACEMENT Left 11/26/2016   Procedure: CYSTOSCOPY WITH left double J STENT PLACEMENT;  Surgeon: Watt Rush, MD;  Location: [facility];  Service: Urology;  Laterality: Left;   FEMORAL-POPLITEAL BYPASS GRAFT  1994   dr dyane   I & D EXTREMITY Right 01/09/2015   Procedure: IRRIGATION AND DEBRIDEMENT RIGHT THUMB/HAND ;  Surgeon: Elsie Mussel, MD;  Location: [facility];  Service: Orthopedics;  Laterality: Right;   LEFT HEART CATHETERIZATION WITH CORONARY ANGIOGRAM N/A 03/06/2014   Procedure: LEFT HEART CATHETERIZATION WITH CORONARY ANGIOGRAM;  Surgeon: Peter M Jordan, MD;  Location: [facility] CATH LAB;  Service: Cardiovascular;  Laterality: N/A;  midLAD 50-60%; D1 20-30%; mCFX 30%; pRCA 30%;  ef 55-65%   LUMBAR SPINE SURGERY  1980's   REVERSE SHOULDER ARTHROPLASTY Right 03/11/2023   Procedure: RIGHT REVERSE SHOULDER ARTHROPLASTY;  Surgeon: Melita Drivers, MD;  [facility];  Service: Orthopedics;  Laterality: Right;   TRANSURETHRAL RESECTION OF BLADDER TUMOR N/A 11/26/2016   Procedure: TRANSURETHRAL RESECTION OF BLADDER TUMOR (TURBT)/ left ureteroscopy;  Surgeon: Watt Rush, MD;  [facility]  Service: Urology;  Laterality: N/A;   TRANSURETHRAL RESECTION OF BLADDER TUMOR WITH MITOMYCIN -C N/A 10/31/2019   Procedure: TRANSURETHRAL RESECTION OF BLADDER TUMOR WITH GEMCITABINE ;  Surgeon: Watt Rush, MD;  [facility];  Service: Urology;  Laterality: N/A;   Patient Active Problem List   Diagnosis Date Noted   COPD GOLD II  11/12/2017   Primary osteoarthritis, left shoulder 01/06/2017   CAD in native artery 03/28/2014   Hyperlipidemia LDL goal <70 03/28/2014    Essential hypertension    REFERRING PROVIDER: Franky Pointer MD  REFERRING DIAG: S/p right total shoulder replacement  THERAPY DIAG:  Chronic right shoulder pain  Stiffness of right shoulder, not elsewhere classified  Rationale for Evaluation and Treatment: Rehabilitation  ONSET DATE: 03/11/23 (surgery date).  SUBJECTIVE:                                                                                                                                                                                      SUBJECTIVE STATEMENT: No new complaints.  PERTINENT HISTORY: See above.  PAIN:  Are you having pain? Yes: NPRS scale: 1-2/10 Pain location: Right shoulder. Pain description: Ache. Aggravating factors: Certain movements. Relieving factors: Rest.  PRECAUTIONS: Other: Progress per protocol.   No ultrasound.   WEIGHT BEARING RESTRICTIONS:  No right UE weight bearing.  FALLS:  Has patient fallen in last 6 months? No  LIVING ENVIRONMENT: Lives with: lives with their spouse Lives in: House/apartment Has following equipment at home: None  OCCUPATION: Retired.  PLOF: Independent  PATIENT GOALS:Use right UE without pain.  NEXT MD VISIT:   OBJECTIVE:  Note: Objective measures were completed at Evaluation unless otherwise noted.  PATIENT SURVEYS:  FOTO 49.29.  UPPER EXTREMITY ROM:   In supine:  Gentle right shoulder passive flexion to 75 degrees and ER is 10 degrees.  PALPATION:  Minimal c/o right anterior shoulder pain.  His incisional site appears to be healing well.   TODAY'S TREATMENT:  DATE:       04/26/23:                                     EXERCISE LOG  Exercise Repetitions and Resistance Comments  Pulleys 5 minutes   UE Ranger on wall 5 minutes   Wall ladder 5 minutes           In supine:  PROM x 15 minutes to  patient's right shoulder per protocol guidelines.                                   04-22-23 EXERCISE LOG    RT shldr  Exercise Repetitions and Resistance Comments  Pulleys 5 minutes Flexion  UE ranger (seated)  5 minutes AP and circles  Wall ladder   X 10  Max #30   Ball/wall x10   R shoulder isometrics with step-outs  Yellow tband x 20 for IR/ER and punch with arm at side  Flexion,  ER, IR   Resisted row  Red t-band x 3 minutes   AAROM  shoulder flexion standing X 10 manual assist With cane  Supine CW/CCW 3 x 10 each way 1#   Wall slides   Flexion, scaption, and abduction   Blank cell = exercise not performed today   Manual PROM flexion  135  and ER 50 degrees    04/05/2023 EXERCISE LOG    RT shldr        4 weeks p/o 04-08-23  Exercise Repetitions and Resistance Comments  Pulleys X 5 mins   UE Ranger X 6 mins                Blank cell = exercise not performed today  Manual PROM / AAROM RT shldr flexion and ER.   Flexion to 120 degrees and ER to 55 degrees    Vasopneumatic on low x 15 minutes to patient's right shoulder with pillow between right elbow and thorax.   PATIENT EDUCATION: Education details: healing, prognosis, expectation for soreness, HEP, and surgical condition/ activity modification  Person educated: Patient Education method: Explanation Education comprehension: verbalized understanding  HOME EXERCISE PROGRAM: [id number]  ASSESSMENT:  CLINICAL IMPRESSION:  Patient progressing well per protocol guidelines and he is very pleased with his progress.  OBJECTIVE IMPAIRMENTS: decreased activity tolerance, decreased ROM, and pain.   ACTIVITY LIMITATIONS: carrying, lifting, and reach over head  PARTICIPATION LIMITATIONS: meal prep, cleaning, laundry, and yard work  [facility] [facility] POTENTIAL: Excellent  CLINICAL DECISION MAKING: Stable/uncomplicated  EVALUATION COMPLEXITY: Low   GOALS:  SHORT TERM GOALS: Target date: 04/27/23  Ind with an initial HEP. Goal  status: MET  2.  Passive right shoulder ER to 45 degrees.  Goal status: MET  3.  Passive right shoulder flexion to 115 degrees.  Goal status: MET   LONG TERM GOALS: Target date: 05/25/23.  Ind with an advanced HEP.  Goal status: On going  2.  Active right shoulder flexion to 135 degrees so the patient can easily reach overhead.  Goal status:  On going  3.  Active ER to 60 degrees+ to allow for easily donning/doffing of apparel.  Goal status: On going  4.  Perform ADL's with right shoulder pain not > 2-3/10.  Goal status: On going  5.  Increase right shoulder strength to a solid 4/5 to increase stability  for performance of functional activities.  Goal status: On going PLAN:  PT FREQUENCY: 2x/week  PT DURATION: 4 weeks  PLANNED INTERVENTIONS: 97110-Therapeutic exercises, 97530- Therapeutic activity, [id number]- Neuromuscular re-education, 97535- Self Care, 02859- Manual therapy, 97014- Electrical stimulation (unattended), 97016- Vasopneumatic device, Patient/Family education, Cryotherapy, and Moist heat  PLAN FOR NEXT SESSION:    MD note next visit Progress per protocol.  Please review HEP.  Vasopneumatic with pillow between right elbow and thorax.   Anneka Studer, PT 04/26/2023, 9:38 AM

## 2023-04-29 ENCOUNTER — Ambulatory Visit: Payer: No Typology Code available for payment source | Admitting: *Deleted

## 2023-04-29 ENCOUNTER — Encounter: Payer: Self-pay | Admitting: *Deleted

## 2023-04-29 DIAGNOSIS — G8929 Other chronic pain: Secondary | ICD-10-CM

## 2023-04-29 DIAGNOSIS — M25611 Stiffness of right shoulder, not elsewhere classified: Secondary | ICD-10-CM

## 2023-04-29 NOTE — Therapy (Signed)
 OUTPATIENT PHYSICAL THERAPY SHOULDER TREATMENT   Patient Name: Manuel Barrett MRN: [id number] DOB:Jun 22, 1945, 78 y.o., male Today's Date: 04/29/2023  END OF SESSION:  PT End of Session - 04/29/23 0939     Visit Number 9    Number of Visits 14    Date for PT Re-Evaluation 05/25/23    Authorization Type FOTO.    PT Start Time 0930               Past Medical History:  Diagnosis Date   Arthritis    Bladder tumor    CAD in native artery followed by pcp at the [facility] in [place] 11-13-2016 pt denies S&S   per cardiac cath 03-06-2014  by dr jordan--  moderate non-obstructive cad   Cancer [facility])    bladder   Chronic kidney disease    Emphysema/COPD (HCC)    GERD (gastroesophageal reflux disease)    Gout    per pt currently stable 11-13-2016   History of kidney stones    History of MRSA infection    right hand abscess 09/ 2016   Hypertension    Left inguinal hernia    PAD (peripheral artery disease) (HCC) first dx 1994---  was followed by vascular dr dyane   s/p  fem-pop bypass graft 1994 and 2001/  [facility] [place] [facility]2016 denies s&s   Rotator cuff syndrome of left shoulder    Wears hearing aid in both ears    Past Surgical History:  Procedure Laterality Date   ANTERIOR CERVICAL DECOMP/DISCECTOMY FUSION  02/1999   AORTA - BILATERAL FEMORAL ARTERY BYPASS GRAFT  2001  dr dyane   APPENDECTOMY  1959   BLADDER SURGERY  11/2022   COLONOSCOPY N/A 02/06/2021   Procedure: COLONOSCOPY;  Surgeon: Golda Claudis PENNER, MD;  [facility];  Service: Endoscopy;  Laterality: N/A;  10:30   CYSTOSCOPY W/ RETROGRADES Bilateral 10/31/2019   Procedure: CYSTOSCOPY WITH BILATERAL RETROGRADE PYELOGRAM;  Surgeon: Watt Rush, MD;  [facility]: [facility];  Service: Urology;  Laterality: Bilateral;   CYSTOSCOPY WITH BIOPSY Bilateral 12/02/2021   Procedure: CYSTOSCOPY WITH BLADDER BIOPSY AND FULGURATION BILATERAL  RETROGRADES LEFT URETEROSCOPY  left stent placement;   Surgeon: Watt Rush, MD;  [facility]: [facility];  Service: Urology;  Laterality: Bilateral;  1HR FOR CASE   CYSTOSCOPY WITH STENT PLACEMENT Left 11/26/2016   Procedure: CYSTOSCOPY WITH left double J STENT PLACEMENT;  Surgeon: Watt Rush, MD;  Location: [facility];  Service: Urology;  Laterality: Left;   FEMORAL-POPLITEAL BYPASS GRAFT  1994   dr dyane   I & D EXTREMITY Right 01/09/2015   Procedure: IRRIGATION AND DEBRIDEMENT RIGHT THUMB/HAND ;  Surgeon: Elsie Mussel, MD;  [facility]: [facility];  Service: Orthopedics;  Laterality: Right;   LEFT HEART CATHETERIZATION WITH CORONARY ANGIOGRAM N/A 03/06/2014   Procedure: LEFT HEART CATHETERIZATION WITH CORONARY ANGIOGRAM;  Surgeon: Peter M Jordan, MD;  Location: [facility] CATH LAB;  Service: Cardiovascular;  Laterality: N/A;  midLAD 50-60%; D1 20-30%; mCFX 30%; pRCA 30%;  ef 55-65%   LUMBAR SPINE SURGERY  1980's   REVERSE SHOULDER ARTHROPLASTY Right 03/11/2023   Procedure: RIGHT REVERSE SHOULDER ARTHROPLASTY;  Surgeon: Melita Drivers, MD;  [facility];  Service: Orthopedics;  Laterality: Right;    TRANSURETHRAL RESECTION OF BLADDER TUMOR N/A 11/26/2016   Procedure: TRANSURETHRAL RESECTION OF BLADDER TUMOR (TURBT)/ left ureteroscopy;  Surgeon: Watt Rush, MD;  Location: [facility];  Service: Urology;  Laterality: N/A;  TRANSURETHRAL RESECTION OF BLADDER TUMOR WITH MITOMYCIN -C N/A 10/31/2019   Procedure: TRANSURETHRAL RESECTION OF BLADDER TUMOR WITH GEMCITABINE ;  Surgeon: Watt Rush, MD;  [facility];  Service: Urology;  Laterality: N/A;   Patient Active Problem List   Diagnosis Date Noted   COPD GOLD II  11/12/2017   Primary osteoarthritis, left shoulder 01/06/2017   CAD in native artery 03/28/2014   Hyperlipidemia LDL goal <70 03/28/2014   Essential hypertension    REFERRING PROVIDER: Franky Pointer MD  REFERRING DIAG: S/p right total shoulder replacement  THERAPY DIAG:  Chronic right shoulder  pain  Stiffness of right shoulder, not elsewhere classified  Rationale for Evaluation and Treatment: Rehabilitation  ONSET DATE: 03/11/23 (surgery date).  SUBJECTIVE:                                                                                                                                                                                      SUBJECTIVE STATEMENT: RT shldr feels good.  MD was pleased with status  PERTINENT HISTORY: See above.  PAIN:  Are you having pain? Yes: NPRS scale: 1-2/10 Pain location: Right shoulder. Pain description: Ache. Aggravating factors: Certain movements. Relieving factors: Rest.  PRECAUTIONS: Other: Progress per protocol.   No ultrasound.   WEIGHT BEARING RESTRICTIONS:  No right UE weight bearing.  FALLS:  Has patient fallen in last 6 months? No  LIVING ENVIRONMENT: Lives with: lives with their spouse Lives in: House/apartment Has following equipment at home: None  OCCUPATION: Retired.  PLOF: Independent  PATIENT GOALS:Use right UE without pain.  NEXT MD VISIT:   OBJECTIVE:  Note: Objective measures were completed at Evaluation unless otherwise noted.  PATIENT SURVEYS:  FOTO 49.29.  UPPER EXTREMITY ROM:   In supine:  Gentle right shoulder passive flexion to 75 degrees and ER is 10 degrees.  PALPATION:  Minimal c/o right anterior shoulder pain.  His incisional site appears to be healing well.   TODAY'S TREATMENT:  DATE:       04/29/23:                                                           EXERCISE LOG    RT shldr  Exercise Repetitions and Resistance Comments  Pulleys 5 minutes Flexion  UE ranger (seated)  5 minutes AP and circles  Wall ladder   X 10  Max #30   Ball/wall  3 x10   Punches, Rows, ext, and IR  RED tband x 20   Flexion,  ER, IR   ER yellow t-band x 20   AAROM   shoulder flexion standing X 10 manual assist With cane  Supine CW/CCW 3 x 10 each way 2#   Bicep curls 5# 3x10-15 Flexion, scaption, and abduction   Blank cell = exercise not performed today   Manual PROM for IR and ER                                      EXERCISE LOG     Exercise Repetitions and Resistance Comments  Pulleys 5 minutes   UE Ranger on wall 5 minutes   Wall ladder 5 minutes           In supine:  PROM x 15 minutes to patient's right shoulder per protocol guidelines.                                   04-22-23 EXERCISE LOG    RT shldr  Exercise Repetitions and Resistance Comments  Pulleys 5 minutes Flexion  UE ranger (seated)  5 minutes AP and circles  Wall ladder   X 10  Max #30   Ball/wall x10   R shoulder isometrics with step-outs  Yellow tband x 20 for IR/ER and punch with arm at side  Flexion,  ER, IR   Resisted row  Red t-band x 3 minutes   AAROM  shoulder flexion standing X 10 manual assist With cane  Supine CW/CCW 3 x 10 each way 1#   Wall slides   Flexion, scaption, and abduction   Blank cell = exercise not performed today   Manual PROM flexion  135  and ER 50 degrees    04/05/2023 EXERCISE LOG    RT shldr        4 weeks p/o 04-08-23  Exercise Repetitions and Resistance Comments  Pulleys X 5 mins   UE Ranger X 6 mins                Blank cell = exercise not performed today  Manual PROM / AAROM RT shldr flexion and ER.   Flexion to 120 degrees and ER to 55 degrees    Vasopneumatic on low x 15 minutes to patient's right shoulder with pillow between right elbow and thorax.   PATIENT EDUCATION: Education details: healing, prognosis, expectation for soreness, HEP, and surgical condition/ activity modification  Person educated: Patient Education method: Explanation Education comprehension: verbalized understanding  HOME EXERCISE PROGRAM: [id number]  ASSESSMENT:  CLINICAL IMPRESSION:  Patient arrived today reporting doing fairly well with RT  shldr and reports MD was pleased with current  status and said to slowly keep progressing IR HBB. Pt able to progress well with resistive therex.  OBJECTIVE IMPAIRMENTS: decreased activity tolerance, decreased ROM, and pain.   ACTIVITY LIMITATIONS: carrying, lifting, and reach over head  PARTICIPATION LIMITATIONS: meal prep, cleaning, laundry, and yard work  [facility] POTENTIAL: Excellent  CLINICAL DECISION MAKING: Stable/uncomplicated  EVALUATION COMPLEXITY: Low   GOALS:  SHORT TERM GOALS: Target date: 04/27/23  Ind with an initial HEP. Goal status: MET  2.  Passive right shoulder ER to 45 degrees.  Goal status: MET  3.  Passive right shoulder flexion to 115 degrees.  Goal status: MET   LONG TERM GOALS: Target date: 05/25/23.  Ind with an advanced HEP.  Goal status: On going  2.  Active right shoulder flexion to 135 degrees so the patient can easily reach overhead.  Goal status:  On going  3.  Active ER to 60 degrees+ to allow for easily donning/doffing of apparel.  Goal status: On going  4.  Perform ADL's with right shoulder pain not > 2-3/10.  Goal status: On going  5.  Increase right shoulder strength to a solid 4/5 to increase stability for performance of functional activities.  Goal status: On going PLAN:  PT FREQUENCY: 2x/week  PT DURATION: 4 weeks  PLANNED INTERVENTIONS: 97110-Therapeutic exercises, 97530- Therapeutic activity, [id number]- Neuromuscular re-education, 97535- Self Care, [id number]- Manual therapy, 97014- Electrical stimulation (unattended), 97016- Vasopneumatic device, Patient/Family education, Cryotherapy, and Moist heat  PLAN FOR NEXT SESSION:     Progress per protocol.  Please review HEP.    Exavier Lina,CHRIS, PTA 04/29/2023, 10:29 AM

## 2023-05-04 ENCOUNTER — Ambulatory Visit: Payer: No Typology Code available for payment source

## 2023-05-04 DIAGNOSIS — M25611 Stiffness of right shoulder, not elsewhere classified: Secondary | ICD-10-CM

## 2023-05-04 DIAGNOSIS — G8929 Other chronic pain: Secondary | ICD-10-CM

## 2023-05-04 NOTE — Therapy (Addendum)
 OUTPATIENT PHYSICAL THERAPY SHOULDER TREATMENT   Patient Name: Manuel Barrett MRN: [id number] DOB:1946-01-05, 78 y.o., male Today's Date: 05/04/2023  END OF SESSION:  PT End of Session - 05/04/23 0836     Visit Number 10    Number of Visits 14    Date for PT Re-Evaluation 05/25/23    Authorization Type FOTO.    PT Start Time [phone]    PT Stop Time 0915    PT Time Calculation (min) 40 min               Past Medical History:  Diagnosis Date   Arthritis    Bladder tumor    CAD in native artery followed by pcp at the [facility] in [place] 11-13-2016 pt denies S&S   per cardiac cath 03-06-2014  by dr jordan--  moderate non-obstructive cad   Cancer [facility])    bladder   Chronic kidney disease    Emphysema/COPD (HCC)    GERD (gastroesophageal reflux disease)    Gout    per pt currently stable 11-13-2016   History of kidney stones    History of MRSA infection    right hand abscess 09/ 2016   Hypertension    Left inguinal hernia    PAD (peripheral artery disease) (HCC) first dx 1994---  was followed by vascular dr dyane   s/p  fem-pop bypass graft 1994 and 2001/  [facility] [place] [facility]2016 denies s&s   Rotator cuff syndrome of left shoulder    Wears hearing aid in both ears    Past Surgical History:  Procedure Laterality Date   ANTERIOR CERVICAL DECOMP/DISCECTOMY FUSION  02/1999   AORTA - BILATERAL FEMORAL ARTERY BYPASS GRAFT  2001  dr dyane   APPENDECTOMY  1959   BLADDER SURGERY  11/2022   COLONOSCOPY N/A 02/06/2021   Procedure: COLONOSCOPY;  Surgeon: Golda Claudis PENNER, MD;  [facility];  Service: Endoscopy;  Laterality: N/A;  10:30   CYSTOSCOPY W/ RETROGRADES Bilateral 10/31/2019   Procedure: CYSTOSCOPY WITH BILATERAL RETROGRADE PYELOGRAM;  Surgeon: Watt Rush, MD;  [facility];  Service: Urology;  Laterality: Bilateral;   CYSTOSCOPY WITH BIOPSY Bilateral 12/02/2021   Procedure: CYSTOSCOPY WITH BLADDER BIOPSY AND FULGURATION  BILATERAL  RETROGRADES LEFT URETEROSCOPY  left stent placement;  Surgeon: Watt Rush, MD;  [facility];  Service: Urology;  Laterality: Bilateral;  1HR FOR CASE   CYSTOSCOPY WITH STENT PLACEMENT Left 11/26/2016   Procedure: CYSTOSCOPY WITH left double J STENT PLACEMENT;  Surgeon: Watt Rush, MD;  Location: [facility];  Service: Urology;  Laterality: Left;   FEMORAL-POPLITEAL BYPASS GRAFT  1994   dr dyane   I & D EXTREMITY Right 01/09/2015   Procedure: IRRIGATION AND DEBRIDEMENT RIGHT THUMB/HAND ;  Surgeon: Elsie Mussel, MD;  [facility];  Service: Orthopedics;  Laterality: Right;   LEFT HEART CATHETERIZATION WITH CORONARY ANGIOGRAM N/A 03/06/2014   Procedure: LEFT HEART CATHETERIZATION WITH CORONARY ANGIOGRAM;  Surgeon: Peter M Jordan, MD;  Location: [facility] CATH LAB;  Service: Cardiovascular;  Laterality: N/A;  midLAD 50-60%; D1 20-30%; mCFX 30%; pRCA 30%;  ef 55-65%   LUMBAR SPINE SURGERY  1980's   REVERSE SHOULDER ARTHROPLASTY Right 03/11/2023   Procedure: RIGHT REVERSE SHOULDER ARTHROPLASTY;  Surgeon: Melita Drivers, MD;  [facility];  Service: Orthopedics;  Laterality: Right;    TRANSURETHRAL RESECTION OF BLADDER TUMOR N/A 11/26/2016   Procedure: TRANSURETHRAL RESECTION OF BLADDER TUMOR (TURBT)/ left ureteroscopy;  Surgeon: Watt Rush, MD;  Location: [facility];  Service: Urology;  Laterality: N/A;   TRANSURETHRAL RESECTION OF BLADDER TUMOR WITH MITOMYCIN -C N/A 10/31/2019   Procedure: TRANSURETHRAL RESECTION OF BLADDER TUMOR WITH GEMCITABINE ;  Surgeon: Watt Rush, MD;  [facility];  Service: Urology;  Laterality: N/A;   Patient Active Problem List   Diagnosis Date Noted   COPD GOLD II  11/12/2017   Primary osteoarthritis, left shoulder 01/06/2017   CAD in native artery 03/28/2014   Hyperlipidemia LDL goal <70 03/28/2014   Essential hypertension    REFERRING PROVIDER: Franky Pointer MD  REFERRING DIAG: S/p right total shoulder  replacement  THERAPY DIAG:  Chronic right shoulder pain  Stiffness of right shoulder, not elsewhere classified  Rationale for Evaluation and Treatment: Rehabilitation  ONSET DATE: 03/11/23 (surgery date).  SUBJECTIVE:                                                                                                                                                                                      SUBJECTIVE STATEMENT: Pt denies any pain today.  PERTINENT HISTORY: See above.  PAIN:  Are you having pain? No  PRECAUTIONS: Other: Progress per protocol.   No ultrasound.   WEIGHT BEARING RESTRICTIONS:  No right UE weight bearing.  FALLS:  Has patient fallen in last 6 months? No  LIVING ENVIRONMENT: Lives with: lives with their spouse Lives in: House/apartment Has following equipment at home: None  OCCUPATION: Retired.  PLOF: Independent  PATIENT GOALS:Use right UE without pain.  NEXT MD VISIT:   OBJECTIVE:  Note: Objective measures were completed at Evaluation unless otherwise noted.  PATIENT SURVEYS:  FOTO 49.29.  UPPER EXTREMITY ROM:   In supine:  Gentle right shoulder passive flexion to 75 degrees and ER is 10 degrees.  PALPATION:  Minimal c/o right anterior shoulder pain.  His incisional site appears to be healing well.   TODAY'S TREATMENT:  DATE:       05/04/23:                                  EXERCISE LOG    RT shldr  Exercise Repetitions and Resistance Comments  Pulleys 5 minutes Flexion  UE ranger (standing)  5 minutes AP and circles  Wall ladder  10 reps  Max #30   Ball/wall 3 x10   Punches, Rows, ext, and IR Red tband x 25  Flexion,  ER, IR   ER Red t-band x 25   Function IR Stretch 4 reps x 30 sec   Therabar  Up/down 2 mins each   Wall Push Ups 20 reps   3-way Bicep Curls 5# 30 reps each    Blank cell =  exercise not performed today                                       EXERCISE LOG     Exercise Repetitions and Resistance Comments  Pulleys 5 minutes   UE Ranger on wall 5 minutes   Wall ladder 5 minutes           In supine:  PROM x 15 minutes to patient's right shoulder per protocol guidelines.                                   PATIENT EDUCATION: Education details: healing, prognosis, expectation for soreness, HEP, and surgical condition/ activity modification  Person educated: Patient Education method: Explanation Education comprehension: verbalized understanding  HOME EXERCISE PROGRAM: [id number]  ASSESSMENT:  CLINICAL IMPRESSION:   Pt arrives for today's treatment session denying any pain.  Pt able to increase FOTO score to 66 today.  Pt able to demonstrate 135 degrees of right shoulder flexion and 65 degrees of ER meeting both of his LTGs.  Pt able to demonstrate 4/5 global right shoulder strength, meeting her strength goal as well.  Pt introduced therabar and wall push ups today with good results.  Pt challenged by therabara and pronated bicep curls. Surgeon requests pt begin working on behind the back motions while at therapy.   Pt denied any pain at completion of today's treatment session.   OBJECTIVE IMPAIRMENTS: decreased activity tolerance, decreased ROM, and pain.   ACTIVITY LIMITATIONS: carrying, lifting, and reach over head  PARTICIPATION LIMITATIONS: meal prep, cleaning, laundry, and yard work  [facility] POTENTIAL: Excellent  CLINICAL DECISION MAKING: Stable/uncomplicated  EVALUATION COMPLEXITY: Low   GOALS:  SHORT TERM GOALS: Target date: 04/27/23  Ind with an initial HEP. Goal status: MET  2.  Passive right shoulder ER to 45 degrees.  Goal status: MET  3.  Passive right shoulder flexion to 115 degrees.  Goal status: MET   LONG TERM GOALS: Target date: 05/25/23.  Ind with an advanced HEP.  Goal status: MET  2.  Active right shoulder flexion to 135  degrees so the patient can easily reach overhead.   1/14: 135 degrees Goal status:  MET  3.  Active ER to 60 degrees+ to allow for easily donning/doffing of apparel.   1/14: 65 degrees Goal status: MET  4.  Perform ADL's with right shoulder pain not > 2-3/10.  Goal status: MET  5.  Increase right shoulder strength to a  solid 4/5 to increase stability for performance of functional activities.   1/14: 4/5 global right shoulder strength Goal status: MET  6.  Patient able to put left hand in back pocket. PLAN:  PT FREQUENCY: 2x/week  PT DURATION: 4 weeks  PLANNED INTERVENTIONS: 97110-Therapeutic exercises, 97530- Therapeutic activity, [id number]- Neuromuscular re-education, 97535- Self Care, [id number]- Manual therapy, 97014- Electrical stimulation (unattended), 97016- Vasopneumatic device, Patient/Family education, Cryotherapy, and Moist heat  PLAN FOR NEXT SESSION:     Progress per protocol.  Please review HEP.   Delon Gosling PTA  Progress Note Reporting Period 03/30/23 to 05/04/23  See note below for Objective Data and Assessment of Progress/Goals. Excellent progress toward goals.       APPLEGATE, CHAD, PT 05/04/2023, 1:44 PM

## 2023-05-06 ENCOUNTER — Ambulatory Visit: Payer: No Typology Code available for payment source

## 2023-05-06 DIAGNOSIS — M25611 Stiffness of right shoulder, not elsewhere classified: Secondary | ICD-10-CM

## 2023-05-06 DIAGNOSIS — G8929 Other chronic pain: Secondary | ICD-10-CM

## 2023-05-06 NOTE — Therapy (Signed)
OUTPATIENT PHYSICAL THERAPY SHOULDER TREATMENT   Patient Name: Manuel Barrett MRN: [id number] DOB:04/09/1946, 78 y.o., male Today's Date: 05/06/2023  END OF SESSION:  PT End of Session - 05/06/23 0849     Visit Number 11    Number of Visits 14    Date for PT Re-Evaluation 05/25/23    Authorization Type FOTO.    PT Start Time 0845    PT Stop Time 0929    PT Time Calculation (min) 44 min               Past Medical History:  Diagnosis Date   Arthritis    Bladder tumor    CAD in native artery followed by pcp at the [place] in [place] 11-13-2016 pt denies S&S   per cardiac cath 03-06-2014  by dr Swaz[place]and--  moderate non-obstructive cad   Cancer [facility])    bladder   Chronic kidney disease    Emphysema/COPD (HCC)    GERD (gastroesophageal reflux disease)    Gout    per pt currently stable 11-13-2016   History of kidney stones    History of MRSA infection    right hand abscess 09/ 2016   Hypertension    Left inguinal hernia    PAD (peripheral artery disease) (HCC) first dx 1994---  was followed by vascular dr Madilyn Fireman   s/p  fem-pop bypass graft 1994 and 2001/  [facility] [place] [facility]2016 denies s&s   Rotator cuff syndrome of left shoulder    Wears hearing aid in both ears    Past Surgical History:  Procedure Laterality Date   ANTERIOR CERVICAL DECOMP/DISCECTOMY FUSION  02/1999   AORTA - BILATERAL FEMORAL ARTERY BYPASS GRAFT  2001  dr Madilyn Fireman   APPENDECTOMY  1959   BLADDER SURGERY  11/2022   COLONOSCOPY N/A 02/06/2021   Procedure: COLONOSCOPY;  Surgeon: Malissa Hippo, MD;  [facility];  Service: Endoscopy;  Laterality: N/A;  10:30   CYSTOSCOPY W/ RETROGRADES Bilateral 10/31/2019   Procedure: CYSTOSCOPY WITH BILATERAL RETROGRADE PYELOGRAM;  Surgeon: Bjorn Pippin, MD;  [facility]: [facility];  Service: Urology;  Laterality: Bilateral;   CYSTOSCOPY WITH BIOPSY Bilateral 12/02/2021   Procedure: CYSTOSCOPY WITH BLADDER BIOPSY AND FULGURATION  BILATERAL  RETROGRADES LEFT URETEROSCOPY  left stent placement;  Surgeon: Bjorn Pippin, MD;  [facility]: [facility];  Service: Urology;  Laterality: Bilateral;  1HR FOR CASE   CYSTOSCOPY WITH STENT PLACEMENT Left 11/26/2016   Procedure: CYSTOSCOPY WITH left double J STENT PLACEMENT;  Surgeon: Bjorn Pippin, MD;  [facility];  Service: Urology;  Laterality: Left;   FEMORAL-POPLITEAL BYPASS GRAFT  1994   dr Madilyn Fireman   I & D EXTREMITY Right 01/09/2015   Procedure: IRRIGATION AND DEBRIDEMENT RIGHT THUMB/HAND ;  Surgeon: Dominica Severin, MD;  [facility];  Service: Orthopedics;  Laterality: Right;   LEFT HEART CATHETERIZATION WITH CORONARY ANGIOGRAM N/A 03/06/2014   Procedure: LEFT HEART CATHETERIZATION WITH CORONARY ANGIOGRAM;  Surgeon: Peter M Swaziland, MD;  Location[facility] C[facility] LAB;  Service: Cardiovascular;  Laterality: N/A;  midLAD 50-60%; D1 20-30%; mCFX 30%; pRCA 30%;  ef 55-65%   LUMBAR SPINE SURGERY  1980's   REVERSE SHOULDER ARTHROPLASTY Right 03/11/2023   Procedure: RIGHT REVERSE SHOULDER ARTHROPLASTY;  Surgeon: Francena Hanly, MD;  [facility];  Service: Orthopedics;  Laterality: Right;    TRANSURETHRAL RESECTION OF BLADDER TUMOR N/A 11/26/2016   Procedure: TRANSURETHRAL RESECTION OF BLADDER TUMOR (TURBT)/ left ureteroscopy;  Surgeon: Bjorn Pippin, MD;  Location: [facility];  Service: Urology;  Laterality: N/A;   TRANSURETHRAL RESECTION OF BLADDER TUMOR WITH MITOMYCIN-C N/A 10/31/2019   Procedure: TRANSURETHRAL RESECTION OF BLADDER TUMOR WITH GEMCITABINE;  Surgeon: Bjorn Pippin, MD;  [facility];  Service: Urology;  Laterality: N/A;   Patient Active Problem List   Diagnosis Date Noted   COPD GOLD II  11/12/2017   Primary osteoarthritis, left shoulder 01/06/2017   CAD in native artery 03/28/2014   Hyperlipidemia LDL goal <70 03/28/2014   Essential hypertension    REFERRING PROVIDER: Francena Hanly MD  REFERRING DIAG: S/p right total shoulder  replacement  THERAPY DIAG:  Chronic right shoulder pain  Stiffness of right shoulder, not elsewhere classified  Rationale for Evaluation and Treatment: Rehabilitation  ONSET DATE: 03/11/23 (surgery date).  SUBJECTIVE:                                                                                                                                                                                      SUBJECTIVE STATEMENT: Pt denies any pain today.  PERTINENT HISTORY: See above.  PAIN:  Are you having pain? No  PRECAUTIONS: Other: Progress per protocol.   No ultrasound.   WEIGHT BEARING RESTRICTIONS:  No right UE weight bearing.  FALLS:  Has patient fallen in last 6 months? No  LIVING ENVIRONMENT: Lives with: lives with their spouse Lives in: House/apartment Has following equipment at home: None  OCCUPATION: Retired.  PLOF: Independent  PATIENT GOALS:Use right UE without pain.  NEXT MD VISIT:   OBJECTIVE:  Note: Objective measures were completed at Evaluation unless otherwise noted.  PATIENT SURVEYS:  FOTO 49.29.  UPPER EXTREMITY ROM:   In supine:  Gentle right shoulder passive flexion to 75 degrees and ER is 10 degrees.  PALPATION:  Minimal c/o right anterior shoulder pain.  His incisional site appears to be healing well.   TODAY'S TREATMENT:  DATE:       05/06/23:                                  EXERCISE LOG    RT shldr  Exercise Repetitions and Resistance Comments  UBE 8 mins (4 forward/backward)   Pulleys 5 minutes Flexion  UE ranger (standing)  5 minutes AP and circles  Overhead Press 1# x 20 reps Max #30   Ball/wall    Punches, Rows, ext, and IR Red tband x 25  Flexion,  ER, IR   ER Red t-band x 25   Function IR Stretch 4 reps x 30 sec   Therabar  Green Up/down 2 mins each   Wall Push Ups 25 reps   3-way Bicep  Curls 5# 30 reps each    Blank cell = exercise not performed today                                       EXERCISE LOG     Exercise Repetitions and Resistance Comments  Pulleys 5 minutes   UE Ranger on wall 5 minutes   Wall ladder 5 minutes           In supine:  PROM x 15 minutes to patient's right shoulder per protocol guidelines.                                   PATIENT EDUCATION: Education details: healing, prognosis, expectation for soreness, HEP, and surgical condition/ activity modification  Person educated: Patient Education method: Explanation Education comprehension: verbalized understanding  HOME EXERCISE PROGRAM: [id number]  ASSESSMENT:  CLINICAL IMPRESSION:   Pt arrives for today's treatment session denying any pain, but does report right shoulder stiffness.   Pt introduced to UBE today at beginning of treatment with minimal fatigue.  Pt reports performing IR stretch at home and feels that his IR is increasing gradually.  Pt encouraged to continue performance of exercises at home.  Pt denied any pain at completion of today's treatment session.  OBJECTIVE IMPAIRMENTS: decreased activity tolerance, decreased ROM, and pain.   ACTIVITY LIMITATIONS: carrying, lifting, and reach over head  PARTICIPATION LIMITATIONS: meal prep, cleaning, laundry, and yard work  [facility] POTENTIAL: Excellent  CLINICAL DECISION MAKING: Stable/uncomplicated  EVALUATION COMPLEXITY: Low   GOALS:  SHORT TERM GOALS: Target date: 04/27/23  Ind with an initial HEP. Goal status: MET  2.  Passive right shoulder ER to 45 degrees.  Goal status: MET  3.  Passive right shoulder flexion to 115 degrees.  Goal status: MET   LONG TERM GOALS: Target date: 05/25/23.  Ind with an advanced HEP.  Goal status: MET  2.  Active right shoulder flexion to 135 degrees so the patient can easily reach overhead.   1/14: 135 degrees Goal status:  MET  3.  Active ER to 60 degrees+ to allow for easily  donning/doffing of apparel.   1/14: 65 degrees Goal status: MET  4.  Perform ADL's with right shoulder pain not > 2-3/10.  Goal status: MET  5.  Increase right shoulder strength to a solid 4/5 to increase stability for performance of functional activities.   1/14: 4/5 global right shoulder strength Goal status: MET  6.  Patient able to put left hand  in back pocket. PLAN:  PT FREQUENCY: 2x/week  PT DURATION: 4 weeks  PLANNED INTERVENTIONS: 97110-Therapeutic exercises, 97530- Therapeutic activity, [id number]- Neuromuscular re-education, 97535- Self Care, 66440- Manual therapy, 97014- Electrical stimulation (unattended), 97016- Vasopneumatic device, Patient/Family education, Cryotherapy, and Moist heat  PLAN FOR NEXT SESSION:     Progress per protocol.  Please review HEP.   Newman Pies, PTA 05/06/2023, 11:05 AM

## 2023-05-07 DIAGNOSIS — C678 Malignant neoplasm of overlapping sites of bladder: Secondary | ICD-10-CM | POA: Diagnosis not present

## 2023-05-11 ENCOUNTER — Ambulatory Visit: Payer: No Typology Code available for payment source

## 2023-05-11 DIAGNOSIS — M25611 Stiffness of right shoulder, not elsewhere classified: Secondary | ICD-10-CM

## 2023-05-11 DIAGNOSIS — G8929 Other chronic pain: Secondary | ICD-10-CM | POA: Diagnosis not present

## 2023-05-11 NOTE — Therapy (Signed)
OUTPATIENT PHYSICAL THERAPY SHOULDER TREATMENT   Patient Name: Manuel Barrett MRN: [id number] DOB:06-04-1945, 78 y.o., male Today's Date: 05/11/2023  END OF SESSION:  PT End of Session - 05/11/23 0854     Visit Number 12    Number of Visits 14    Date for PT Re-Evaluation 05/25/23    Authorization Type FOTO.    PT Start Time 0845    PT Stop Time 0931    PT Time Calculation (min) 46 min               Past Medical History:  Diagnosis Date   Arthritis    Bladder tumor    CAD in native artery followed by pcp at the [place] in [place] 11-13-2016 pt denies S&S   per cardiac cath 03-06-2014  by dr [place]--  moderate non-obstructive cad   Cancer [facility])    bladder   Chronic kidney disease    Emphysema/COPD (HCC)    GERD (gastroesophageal reflux disease)    Gout    per pt currently stable 11-13-2016   History of kidney stones    History of MRSA infection    right hand abscess 09/ 2016   Hypertension    Left inguinal hernia    PAD (peripheral artery disease) (HCC) first dx 1994---  was followed by vascular dr Madilyn Fireman   s/p  fem-pop bypass graft 1994 and 2001/  [facility] [place] [facility]-2016 denies s&s   Rotator cuff syndrome of left shoulder    Wears hearing aid in both ears    Past Surgical History:  Procedure Laterality Date   ANTERIOR CERVICAL DECOMP/DISCECTOMY FUSION  02/1999   AORTA - BILATERAL FEMORAL ARTERY BYPASS GRAFT  2001  dr Madilyn Fireman   APPENDECTOMY  1959   BLADDER SURGERY  11/2022   COLONOSCOPY N/A 02/06/2021   Procedure: COLONOSCOPY;  Surgeon: Malissa Hippo, MD;  [facility];  Service: Endoscopy;  Laterality: N/A;  10:30   CYSTOSCOPY W/ RETROGRADES Bilateral 10/31/2019   Procedure: CYSTOSCOPY WITH BILATERAL RETROGRADE PYELOGRAM;  Surgeon: Bjorn Pippin, MD;  [facility];  Service: Urology;  Laterality: Bilateral;   CYSTOSCOPY WITH BIOPSY Bilateral 12/02/2021   Procedure: CYSTOSCOPY WITH BLADDER BIOPSY AND FULGURATION  BILATERAL  RETROGRADES LEFT URETEROSCOPY  left stent placement;  Surgeon: Bjorn Pippin, MD;  [facility];  Service: Urology;  Laterality: Bilateral;  1HR FOR CASE   CYSTOSCOPY WITH STENT PLACEMENT Left 11/26/2016   Procedure: CYSTOSCOPY WITH left double J STENT PLACEMENT;  Surgeon: Bjorn Pippin, MD;  Location[facility];  Service: Urology;  Laterality: Left;   FEMORAL-POPLITEAL BYPASS GRAFT  1994   dr Madilyn Fireman   I & D EXTREMITY Right 01/09/2015   Procedure: IRRIGATION AND DEBRIDEMENT RIGHT THUMB/HAND ;  Surgeon: Dominica Severin, MD;  [facility];  Service: Orthopedics;  Laterality: Right;   LEFT HEART CATHETERIZATION WITH CORONARY ANGIOGRAM N/A 03/06/2014   Procedure: LEFT HEART CATHETERIZATION WITH CORONARY ANGIOGRAM;  Surgeon: Peter M Swaziland, MD;  Location: [facility] CATH LAB;  Service: Cardiovascular;  Laterality: N/A;  midLAD 50-60%; D1 20-30%; mCFX 30%; pRCA 30%;  ef 55-65%   LUMBAR SPINE SURGERY  1980's   REVERSE SHOULDER ARTHROPLASTY Right 03/11/2023   Procedure: RIGHT REVERSE SHOULDER ARTHROPLASTY;  Surgeon: Francena Hanly, MD;  [facility];  Service: Orthopedics;  Laterality: Right;    TRANSURETHRAL RESECTION OF BLADDER TUMOR N/A 11/26/2016   Procedure: TRANSURETHRAL RESECTION OF BLADDER TUMOR (TURBT)/ left ureteroscopy;  Surgeon: Bjorn Pippin, MD;  Location[facility];  Service: Urology;  Laterality: N/A;   TRANSURETHRAL RESECTION OF BLADDER TUMOR WITH MITOMYCIN-C N/A 10/31/2019   Procedure: TRANSURETHRAL RESECTION OF BLADDER TUMOR WITH GEMCITABINE;  Surgeon: Bjorn Pippin, MD;  [facility];  Service: Urology;  Laterality: N/A;   Patient Active Problem List   Diagnosis Date Noted   COPD GOLD II  11/12/2017   Primary osteoarthritis, left shoulder 01/06/2017   CAD in native artery 03/28/2014   Hyperlipidemia LDL goal <70 03/28/2014   Essential hypertension    REFERRING PROVIDER: Francena Hanly MD  REFERRING DIAG: S/p right total shoulder  replacement  THERAPY DIAG:  Chronic right shoulder pain  Stiffness of right shoulder, not elsewhere classified  Rationale for Evaluation and Treatment: Rehabilitation  ONSET DATE: 03/11/23 (surgery date).  SUBJECTIVE:                                                                                                                                                                                      SUBJECTIVE STATEMENT: Pt denies any pain today.  PERTINENT HISTORY: See above.  PAIN:  Are you having pain? No  PRECAUTIONS: Other: Progress per protocol.   No ultrasound.   WEIGHT BEARING RESTRICTIONS:  No right UE weight bearing.  FALLS:  Has patient fallen in last 6 months? No  LIVING ENVIRONMENT: Lives with: lives with their spouse Lives in: House/apartment Has following equipment at home: None  OCCUPATION: Retired.  PLOF: Independent  PATIENT GOALS:Use right UE without pain.  NEXT MD VISIT:   OBJECTIVE:  Note: Objective measures were completed at Evaluation unless otherwise noted.  PATIENT SURVEYS:  FOTO 49.29.  UPPER EXTREMITY ROM:   In supine:  Gentle right shoulder passive flexion to 75 degrees and ER is 10 degrees.  PALPATION:  Minimal c/o right anterior shoulder pain.  His incisional site appears to be healing well.   TODAY'S TREATMENT:  DATE:       05/11/23:                                  EXERCISE LOG    RT shldr  Exercise Repetitions and Resistance Comments  UBE 10 mins (5 forward/backward)   Pulleys 5 minutes Flexion  UE ranger (standing)  5 minutes AP and circles  Overhead Press 1# x 25 reps Max #30   Ball/wall    Punches, Rows, ext, and IR Red tband x 30  Flexion,  ER, IR   ER Red t-band x 30   Function IR Stretch 4 reps x 30 sec   Therabar  Green Up/down 2 mins each   Wall Push Ups 30 reps   3-way  Bicep Curls 6# 20 reps each    Blank cell = exercise not performed today                                       EXERCISE LOG     Exercise Repetitions and Resistance Comments  Pulleys 5 minutes   UE Ranger on wall 5 minutes   Wall ladder 5 minutes           In supine:  PROM x 15 minutes to patient's right shoulder per protocol guidelines.                                   PATIENT EDUCATION: Education details: healing, prognosis, expectation for soreness, HEP, and surgical condition/ activity modification  Person educated: Patient Education method: Explanation Education comprehension: verbalized understanding  HOME EXERCISE PROGRAM: [email or web address]  ASSESSMENT:  CLINICAL IMPRESSION:   Pt arrives for today's treatment session denying any pain.  Pt able to tolerate increased time on UBE today.  Pt able to tolerate increased time or reps with all other exercises previously performed.  Pt continues to have difficulty with behind the back activities despite performing stretches at home.  Pt denied any pain at completion of today's treatment session.  OBJECTIVE IMPAIRMENTS: decreased activity tolerance, decreased ROM, and pain.   ACTIVITY LIMITATIONS: carrying, lifting, and reach over head  PARTICIPATION LIMITATIONS: meal prep, cleaning, laundry, and yard work  [facility] POTENTIAL: Excellent  CLINICAL DECISION MAKING: Stable/uncomplicated  EVALUATION COMPLEXITY: Low   GOALS:  SHORT TERM GOALS: Target date: 04/27/23  Ind with an initial HEP. Goal status: MET  2.  Passive right shoulder ER to 45 degrees.  Goal status: MET  3.  Passive right shoulder flexion to 115 degrees.  Goal status: MET   LONG TERM GOALS: Target date: 05/25/23.  Ind with an advanced HEP.  Goal status: MET  2.  Active right shoulder flexion to 135 degrees so the patient can easily reach overhead.   1/14: 135 degrees Goal status:  MET  3.  Active ER to 60 degrees+ to allow for easily donning/doffing of  apparel.   1/14: 65 degrees Goal status: MET  4.  Perform ADL's with right shoulder pain not > 2-3/10.  Goal status: MET  5.  Increase right shoulder strength to a solid 4/5 to increase stability for performance of functional activities.   1/14: 4/5 global right shoulder strength Goal status: MET  6.  Patient able to put left hand in back pocket. PLAN:  PT FREQUENCY: 2x/week  PT DURATION: 4 weeks  PLANNED INTERVENTIONS: 97110-Therapeutic exercises, 97530- Therapeutic activity, [id number]- Neuromuscular re-education, 97535- Self Care, 11914- Manual therapy, 97014- Electrical stimulation (unattended), 97016- Vasopneumatic device, Patient/Family education, Cryotherapy, and Moist heat  PLAN FOR NEXT SESSION:     Progress per protocol.  Please review HEP.   Newman Pies, PTA 05/11/2023, 9:43 AM

## 2023-05-13 ENCOUNTER — Ambulatory Visit: Payer: No Typology Code available for payment source

## 2023-05-13 DIAGNOSIS — G8929 Other chronic pain: Secondary | ICD-10-CM | POA: Diagnosis not present

## 2023-05-13 DIAGNOSIS — M25611 Stiffness of right shoulder, not elsewhere classified: Secondary | ICD-10-CM

## 2023-05-13 NOTE — Therapy (Signed)
OUTPATIENT PHYSICAL THERAPY SHOULDER TREATMENT   Patient Name: Manuel Barrett MRN: [id number] DOB:28-Apr-1945, 78 y.o., male Today's Date: 05/13/2023  END OF SESSION:  PT End of Session - 05/13/23 0848     Visit Number 13    Number of Visits 14    Date for PT Re-Evaluation 05/25/23    Authorization Type FOTO.    PT Start Time 0845    PT Stop Time 0929    PT Time Calculation (min) 44 min               Past Medical History:  Diagnosis Date   Arthritis    Bladder tumor    CAD in native artery followed by pcp at the [place] in [place] 11-13-2016 pt denies S&S   per cardiac cath 03-06-2014  by dr Swaziland--  moderate non-obstructive cad   Cancer [facility])    bladder   Chronic kidney disease    Emphysema/COPD (HCC)    GERD (gastroesophageal reflux disease)    Gout    per pt currently stable 11-13-2016   History of kidney stones    History of MRSA infection    right hand abscess 09/ 2016   Hypertension    Left inguinal hernia    PAD (peripheral artery disease) (HCC) first dx 1994---  was followed by vascular dr Madilyn Fireman   s/p  fem-pop bypass graft 1994 and 2001/  [facility] [place] [facility]2016 denies s&s   Rotator cuff syndrome of left shoulder    Wears hearing aid in both ears    Past Surgical History:  Procedure Laterality Date   ANTERIOR CERVICAL DECOMP/DISCECTOMY FUSION  02/1999   AORTA - BILATERAL FEMORAL ARTERY BYPASS GRAFT  2001  dr Madilyn Fireman   APPENDECTOMY  1959   BLADDER SURGERY  11/2022   COLONOSCOPY N/A 02/06/2021   Procedure: COLONOSCOPY;  Surgeon: Malissa Hippo, MD;  [facility];  Service: Endoscopy;  Laterality: N/A;  10:30   CYSTOSCOPY W/ RETROGRADES Bilateral 10/31/2019   Procedure: CYSTOSCOPY WITH BILATERAL RETROGRADE PYELOGRAM;  Surgeon: Bjorn Pippin, MD;  [facility]: [facility];  Service: Urology;  Laterality: Bilateral;   CYSTOSCOPY WITH BIOPSY Bilateral 12/02/2021   Procedure: CYSTOSCOPY WITH BLADDER BIOPSY AND FULGURATION  BILATERAL  RETROGRADES LEFT URETEROSCOPY  left stent placement;  Surgeon: Bjorn Pippin, MD;  [facility]: [facility];  Service: Urology;  Laterality: Bilateral;  1HR FOR CASE   CYSTOSCOPY WITH STENT PLACEMENT Left 11/26/2016   Procedure: CYSTOSCOPY WITH left double J STENT PLACEMENT;  Surgeon: Bjorn Pippin, MD;  Location: [facility];  Service: Urology;  Laterality: Left;   FEMORAL-POPLITEAL BYPASS GRAFT  1994   dr Madilyn Fireman   I & D EXTREMITY Right 01/09/2015   Procedure: IRRIGATION AND DEBRIDEMENT RIGHT THUMB/HAND ;  Surgeon: Dominica Severin, MD;  Location: [facility];  Service: Orthopedics;  Laterality: Right;   LEFT HEART CATHETERIZATION WITH CORONARY ANGIOGRAM N/A 03/06/2014   Procedure: LEFT HEART CATHETERIZATION WITH CORONARY ANGIOGRAM;  Surgeon: Peter M Swaziland, MD;  Location: [facility] CATH LAB;  Service: Cardiovascular;  Laterality: N/A;  midLAD 50-60%; D1 20-30%; mCFX 30%; pRCA 30%;  ef 55-65%   LUMBAR SPINE SURGERY  1980's   REVERSE SHOULDER ARTHROPLASTY Right 03/11/2023   Procedure: RIGHT REVERSE SHOULDER ARTHROPLASTY;  Surgeon: Francena Hanly, MD;  [facility];  Service: Orthopedics;  Laterality: Right;    TRANSURETHRAL RESECTION OF BLADDER TUMOR N/A 11/26/2016   Procedure: TRANSURETHRAL RESECTION OF BLADDER TUMOR (TURBT)/ left ureteroscopy;  Surgeon: Bjorn Pippin, MD;  Location: [facility];  Service: Urology;  Laterality: N/A;   TRANSURETHRAL RESECTION OF BLADDER TUMOR WITH MITOMYCIN-C N/A 10/31/2019   Procedure: TRANSURETHRAL RESECTION OF BLADDER TUMOR WITH GEMCITABINE;  Surgeon: Bjorn Pippin, MD;  [facility];  Service: Urology;  Laterality: N/A;   Patient Active Problem List   Diagnosis Date Noted   COPD GOLD II  11/12/2017   Primary osteoarthritis, left shoulder 01/06/2017   CAD in native artery 03/28/2014   Hyperlipidemia LDL goal <70 03/28/2014   Essential hypertension    REFERRING PROVIDER: Francena Hanly MD  REFERRING DIAG: S/p right total shoulder  replacement  THERAPY DIAG:  Chronic right shoulder pain  Stiffness of right shoulder, not elsewhere classified  Rationale for Evaluation and Treatment: Rehabilitation  ONSET DATE: 03/11/23 (surgery date).  SUBJECTIVE:                                                                                                                                                                                      SUBJECTIVE STATEMENT: Pt denies any pain today.  PERTINENT HISTORY: See above.  PAIN:  Are you having pain? No  PRECAUTIONS: Other: Progress per protocol.   No ultrasound.   WEIGHT BEARING RESTRICTIONS:  No right UE weight bearing.  FALLS:  Has patient fallen in last 6 months? No  LIVING ENVIRONMENT: Lives with: lives with their spouse Lives in: House/apartment Has following equipment at home: None  OCCUPATION: Retired.  PLOF: Independent  PATIENT GOALS:Use right UE without pain.  NEXT MD VISIT:   OBJECTIVE:  Note: Objective measures were completed at Evaluation unless otherwise noted.  PATIENT SURVEYS:  FOTO 49.29.  UPPER EXTREMITY ROM:   In supine:  Gentle right shoulder passive flexion to 75 degrees and ER is 10 degrees.  PALPATION:  Minimal c/o right anterior shoulder pain.  His incisional site appears to be healing well.   TODAY'S TREATMENT:  DATE:       05/13/23:                                  EXERCISE LOG    RT shldr  Exercise Repetitions and Resistance Comments  UBE 10 mins (5 forward/backward)   Pulleys 5 minutes Flexion  UE ranger (standing)  5 minutes AP and circles  Overhead Press 2# x 25 reps Max #30   Ball/wall Up/down x 3 mins; Vs x 3 mins   Punches, Rows, ext, and IR Green x 25 reps Flexion,  ER, IR   ER Green t-band x 25   Function IR Stretch 4 reps x 30 sec   Therabar  Green Up/down 2 mins each   Wall  Push Ups 30 reps   3-way Bicep Curls 6# 25 reps each   Carry Weight 30# and 50#    Blank cell = exercise not performed today                                       EXERCISE LOG     Exercise Repetitions and Resistance Comments  Pulleys 5 minutes   UE Ranger on wall 5 minutes   Wall ladder 5 minutes           In supine:  PROM x 15 minutes to patient's right shoulder per protocol guidelines.                                   PATIENT EDUCATION: Education details: healing, prognosis, expectation for soreness, HEP, and surgical condition/ activity modification  Person educated: Patient Education method: Explanation Education comprehension: verbalized understanding  HOME EXERCISE PROGRAM: [id number]  ASSESSMENT:  CLINICAL IMPRESSION:   Pt arrives for today's treatment session denying any pain.  Pt able to demonstrate real life, functional activities by carrying 30# and 50# box around the gym without issue or complaint of pain.  Pt requiring min cues for proper body mechanics with lifting.  Pt able to tolerate increased time or reps with all exercises performed today.  Pt ready for discharge at next treatment session.  Pt denied any pain at completion of today's treatment session.  OBJECTIVE IMPAIRMENTS: decreased activity tolerance, decreased ROM, and pain.   ACTIVITY LIMITATIONS: carrying, lifting, and reach over head  PARTICIPATION LIMITATIONS: meal prep, cleaning, laundry, and yard work  [facility] POTENTIAL: Excellent  CLINICAL DECISION MAKING: Stable/uncomplicated  EVALUATION COMPLEXITY: Low   GOALS:  SHORT TERM GOALS: Target date: 04/27/23  Ind with an initial HEP. Goal status: MET  2.  Passive right shoulder ER to 45 degrees.  Goal status: MET  3.  Passive right shoulder flexion to 115 degrees.  Goal status: MET   LONG TERM GOALS: Target date: 05/25/23.  Ind with an advanced HEP.  Goal status: MET  2.  Active right shoulder flexion to 135 degrees so the patient can  easily reach overhead.   1/14: 135 degrees Goal status:  MET  3.  Active ER to 60 degrees+ to allow for easily donning/doffing of apparel.   1/14: 65 degrees Goal status: MET  4.  Perform ADL's with right shoulder pain not > 2-3/10.  Goal status: MET  5.  Increase right shoulder strength to a solid 4/5 to increase stability  for performance of functional activities.   1/14: 4/5 global right shoulder strength Goal status: MET  6.  Patient able to put left hand in back pocket. PLAN:  PT FREQUENCY: 2x/week  PT DURATION: 4 weeks  PLANNED INTERVENTIONS: 97110-Therapeutic exercises, 97530- Therapeutic activity, [id number]- Neuromuscular re-education, 97535- Self Care, 13086- Manual therapy, 97014- Electrical stimulation (unattended), 97016- Vasopneumatic device, Patient/Family education, Cryotherapy, and Moist heat  PLAN FOR NEXT SESSION:     Progress per protocol.  Please review HEP.   Newman Pies, PTA 05/13/2023, 10:56 AM

## 2023-05-14 DIAGNOSIS — C678 Malignant neoplasm of overlapping sites of bladder: Secondary | ICD-10-CM | POA: Diagnosis not present

## 2023-05-18 ENCOUNTER — Ambulatory Visit: Payer: No Typology Code available for payment source

## 2023-05-18 DIAGNOSIS — G8929 Other chronic pain: Secondary | ICD-10-CM

## 2023-05-18 DIAGNOSIS — M25611 Stiffness of right shoulder, not elsewhere classified: Secondary | ICD-10-CM

## 2023-05-18 NOTE — Therapy (Signed)
OUTPATIENT PHYSICAL THERAPY SHOULDER TREATMENT   Patient Name: Manuel Barrett MRN: [id number] DOB:12/04/1945, 78 y.o., male Today's Date: 05/18/2023  END OF SESSION:  PT End of Session - 05/18/23 0848     Visit Number 14    Number of Visits 14    Date for PT Re-Evaluation 05/25/23    Authorization Type FOTO.    PT Start Time 0845    PT Stop Time 0927    PT Time Calculation (min) 42 min               Past Medical History:  Diagnosis Date   Arthritis    Bladder tumor    CAD in native artery followed by pcp at the [place] in [place] 11-13-2016 pt denies S&S   per cardiac cath 03-06-2014  by dr Swaziland--  moderate non-obstructive cad   Cancer [facility])    bladder   Chronic kidney disease    Emphysema/COPD (HCC)    GERD (gastroesophageal reflux disease)    Gout    per pt currently stable 11-13-2016   History of kidney stones    History of MRSA infection    right hand abscess 09/ 2016   Hypertension    Left inguinal hernia    PAD (peripheral artery disease) (HCC) first dx 1994---  was followed by vascular dr Madilyn Fireman   s/p  fem-pop bypass graft 1994 and 2001/  [facility] [place] [facility]2016 denies s&s   Rotator cuff syndrome of left shoulder    Wears hearing aid in both ears    Past Surgical History:  Procedure Laterality Date   ANTERIOR CERVICAL DECOMP/DISCECTOMY FUSION  02/1999   AORTA - BILATERAL FEMORAL ARTERY BYPASS GRAFT  2001  dr Madilyn Fireman   APPENDECTOMY  1959   BLADDER SURGERY  11/2022   COLONOSCOPY N/A 02/06/2021   Procedure: COLONOSCOPY;  Surgeon: Malissa Hippo, MD;  [facility];  Service: Endoscopy;  Laterality: N/A;  10:30   CYSTOSCOPY W/ RETROGRADES Bilateral 10/31/2019   Procedure: CYSTOSCOPY WITH BILATERAL RETROGRADE PYELOGRAM;  Surgeon: Bjorn Pippin, MD;  [facility];  Service: Urology;  Laterality: Bilateral;   CYSTOSCOPY WITH BIOPSY Bilateral 12/02/2021   Procedure: CYSTOSCOPY WITH BLADDER BIOPSY AND FULGURATION  BILATERAL  RETROGRADES LEFT URETEROSCOPY  left stent placement;  Surgeon: Bjorn Pippin, MD;  [facility];  Service: Urology;  Laterality: Bilateral;  1HR FOR CASE   CYSTOSCOPY WITH STENT PLACEMENT Left 11/26/2016   Procedure: CYSTOSCOPY WITH left double J STENT PLACEMENT;  Surgeon: Bjorn Pippin, MD;  Location: [facility];  Service: Urology;  Laterality: Left;   FEMORAL-POPLITEAL BYPASS GRAFT  1994   dr Madilyn Fireman   I & D EXTREMITY Right 01/09/2015   Procedure: IRRIGATION AND DEBRIDEMENT RIGHT THUMB/HAND ;  Surgeon: Dominica Severin, MD;  [facility];  Service: Orthopedics;  Laterality: Right;   LEFT HEART CATHETERIZATION WITH CORONARY ANGIOGRAM N/A 03/06/2014   Procedure: LEFT HEART CATHETERIZATION WITH CORONARY ANGIOGRAM;  Surgeon: Peter M Swaziland, MD;  Location: [facility] CATH LAB;  Service: Cardiovascular;  Laterality: N/A;  midLAD 50-60%; D1 20-30%; mCFX 30%; pRCA 30%;  ef 55-65%   LUMBAR SPINE SURGERY  1980's   REVERSE SHOULDER ARTHROPLASTY Right 03/11/2023   Procedure: RIGHT REVERSE SHOULDER ARTHROPLASTY;  Surgeon: Francena Hanly, MD;  [facility];  Service: Orthopedics;  Laterality: Right;    TRANSURETHRAL RESECTION OF BLADDER TUMOR N/A 11/26/2016   Procedure: TRANSURETHRAL RESECTION OF BLADDER TUMOR (TURBT)/ left ureteroscopy;  Surgeon: Bjorn Pippin, MD;  Location: [facility];  Service: Urology;  Laterality: N/A;   TRANSURETHRAL RESECTION OF BLADDER TUMOR WITH MITOMYCIN-C N/A 10/31/2019   Procedure: TRANSURETHRAL RESECTION OF BLADDER TUMOR WITH GEMCITABINE;  Surgeon: Bjorn Pippin, MD;  [facility];  Service: Urology;  Laterality: N/A;   Patient Active Problem List   Diagnosis Date Noted   COPD GOLD II  11/12/2017   Primary osteoarthritis, left shoulder 01/06/2017   CAD in native artery 03/28/2014   Hyperlipidemia LDL goal <70 03/28/2014   Essential hypertension    REFERRING PROVIDER: Francena Hanly MD  REFERRING DIAG: S/p right total shoulder  replacement  THERAPY DIAG:  Chronic right shoulder pain  Stiffness of right shoulder, not elsewhere classified  Rationale for Evaluation and Treatment: Rehabilitation  ONSET DATE: 03/11/23 (surgery date).  SUBJECTIVE:                                                                                                                                                                                      SUBJECTIVE STATEMENT: Pt denies any pain today.  PERTINENT HISTORY: See above.  PAIN:  Are you having pain? No  PRECAUTIONS: Other: Progress per protocol.   No ultrasound.   WEIGHT BEARING RESTRICTIONS:  No right UE weight bearing.  FALLS:  Has patient fallen in last 6 months? No  LIVING ENVIRONMENT: Lives with: lives with their spouse Lives in: House/apartment Has following equipment at home: None  OCCUPATION: Retired.  PLOF: Independent  PATIENT GOALS:Use right UE without pain.  NEXT MD VISIT:   OBJECTIVE:  Note: Objective measures were completed at Evaluation unless otherwise noted.  PATIENT SURVEYS:  FOTO 49.29.  UPPER EXTREMITY ROM:   In supine:  Gentle right shoulder passive flexion to 75 degrees and ER is 10 degrees.  PALPATION:  Minimal c/o right anterior shoulder pain.  His incisional site appears to be healing well.   TODAY'S TREATMENT:  DATE:       05/18/23:                                  EXERCISE LOG    RT shldr  Exercise Repetitions and Resistance Comments  UBE 10 mins (5 forward/5 backward 30 RPM)   Pulleys 5 minutes   UE ranger (standing)  5 minutes   Overhead Press 3# x 25 reps   Ball/wall Up/down x 3 mins; Vs x 3 mins   Punches, Rows, ext, and IR Green x 25 reps   ER Green t-band x 25   Function IR Stretch 4 reps x 30 sec   Therabar  Green Up/down 2 mins each   Wall Push Ups 30 reps   3-way Bicep  Curls 6# 30 reps each   Carry Weight     Blank cell = exercise not performed today                                       EXERCISE LOG     Exercise Repetitions and Resistance Comments  Pulleys 5 minutes   UE Ranger on wall 5 minutes   Wall ladder 5 minutes           In supine:  PROM x 15 minutes to patient's right shoulder per protocol guidelines.                                   PATIENT EDUCATION: Education details: healing, prognosis, expectation for soreness, HEP, and surgical condition/ activity modification  Person educated: Patient Education method: Explanation Education comprehension: verbalized understanding  HOME EXERCISE PROGRAM: [id number]  ASSESSMENT:  CLINICAL IMPRESSION:   Pt arrives for today's treatment session denying any pain.  Pt able to get wallet to back pocket, but unable to get wallet into pocket, partially meeting his LTG.  Pt able tolerate increased weight with overhead press and increased reps with 3-way bicep curls.  Pt encouraged to call the facility with any questions or concerns.  Pt denied any pain at completion of today's treatment session.  Pt ready for discharge at this time.  OBJECTIVE IMPAIRMENTS: decreased activity tolerance, decreased ROM, and pain.   ACTIVITY LIMITATIONS: carrying, lifting, and reach over head  PARTICIPATION LIMITATIONS: meal prep, cleaning, laundry, and yard work  [facility] POTENTIAL: Excellent  CLINICAL DECISION MAKING: Stable/uncomplicated  EVALUATION COMPLEXITY: Low   GOALS:  SHORT TERM GOALS: Target date: 04/27/23  Ind with an initial HEP. Goal status: MET  2.  Passive right shoulder ER to 45 degrees.  Goal status: MET  3.  Passive right shoulder flexion to 115 degrees.  Goal status: MET   LONG TERM GOALS: Target date: 05/25/23.  Ind with an advanced HEP.  Goal status: MET  2.  Active right shoulder flexion to 135 degrees so the patient can easily reach overhead.   1/14: 135 degrees Goal status:   MET  3.  Active ER to 60 degrees+ to allow for easily donning/doffing of apparel.   1/14: 65 degrees Goal status: MET  4.  Perform ADL's with right shoulder pain not > 2-3/10.  Goal status: MET  5.  Increase right shoulder strength to a solid 4/5 to increase stability for performance of functional activities.   1/14: 4/5  global right shoulder strength Goal status: MET  6.  Patient able to put left hand in back pocket.  Goal status: PARTIALLY MET PLAN:  PT FREQUENCY: 2x/week  PT DURATION: 4 weeks  PLANNED INTERVENTIONS: 97110-Therapeutic exercises, 97530- Therapeutic activity, [id number]- Neuromuscular re-education, 97535- Self Care, 16109- Manual therapy, 97014- Electrical stimulation (unattended), 97016- Vasopneumatic device, Patient/Family education, Cryotherapy, and Moist heat  PLAN FOR NEXT SESSION:     Progress per protocol.  Please review HEP.   Newman Pies, PTA 05/18/2023, 9:27 AM

## 2023-05-21 DIAGNOSIS — C678 Malignant neoplasm of overlapping sites of bladder: Secondary | ICD-10-CM | POA: Diagnosis not present

## 2023-05-21 DIAGNOSIS — Z5111 Encounter for antineoplastic chemotherapy: Secondary | ICD-10-CM | POA: Diagnosis not present

## 2023-06-04 ENCOUNTER — Other Ambulatory Visit: Payer: Self-pay | Admitting: Cardiology

## 2023-06-09 DIAGNOSIS — Z96611 Presence of right artificial shoulder joint: Secondary | ICD-10-CM | POA: Diagnosis not present

## 2023-06-09 DIAGNOSIS — Z471 Aftercare following joint replacement surgery: Secondary | ICD-10-CM | POA: Diagnosis not present

## 2023-07-01 ENCOUNTER — Other Ambulatory Visit: Payer: Self-pay | Admitting: Cardiology

## 2023-08-03 ENCOUNTER — Other Ambulatory Visit: Payer: Self-pay | Admitting: Cardiology

## 2023-08-09 ENCOUNTER — Other Ambulatory Visit (HOSPITAL_COMMUNITY): Payer: Self-pay | Admitting: Internal Medicine

## 2023-08-09 DIAGNOSIS — N62 Hypertrophy of breast: Secondary | ICD-10-CM

## 2023-08-26 ENCOUNTER — Ambulatory Visit (HOSPITAL_COMMUNITY)
Admission: RE | Admit: 2023-08-26 | Discharge: 2023-08-26 | Disposition: A | Discharge status: Home / Self Care | Source: Ambulatory Visit | Attending: Internal Medicine | Admitting: Internal Medicine

## 2023-08-26 DIAGNOSIS — N62 Hypertrophy of breast: Secondary | ICD-10-CM | POA: Diagnosis present

## 2023-08-31 ENCOUNTER — Ambulatory Visit (HOSPITAL_COMMUNITY)

## 2023-08-31 ENCOUNTER — Encounter (HOSPITAL_COMMUNITY)

## 2023-10-06 DIAGNOSIS — Z85828 Personal history of other malignant neoplasm of skin: Secondary | ICD-10-CM | POA: Diagnosis not present

## 2023-10-06 DIAGNOSIS — L814 Other melanin hyperpigmentation: Secondary | ICD-10-CM | POA: Diagnosis not present

## 2023-10-06 DIAGNOSIS — L821 Other seborrheic keratosis: Secondary | ICD-10-CM | POA: Diagnosis not present

## 2023-10-06 DIAGNOSIS — L570 Actinic keratosis: Secondary | ICD-10-CM | POA: Diagnosis not present

## 2023-10-11 DIAGNOSIS — Z8551 Personal history of malignant neoplasm of bladder: Secondary | ICD-10-CM | POA: Diagnosis not present

## 2023-10-29 ENCOUNTER — Emergency Department (HOSPITAL_COMMUNITY)

## 2023-10-29 ENCOUNTER — Encounter (HOSPITAL_COMMUNITY): Payer: Self-pay | Admitting: Internal Medicine

## 2023-10-29 ENCOUNTER — Observation Stay (HOSPITAL_COMMUNITY)
Admission: EM | Admit: 2023-10-29 | Discharge: 2023-10-30 | Disposition: A | Discharge status: Home / Self Care | Attending: Internal Medicine | Admitting: Internal Medicine

## 2023-10-29 ENCOUNTER — Other Ambulatory Visit: Payer: Self-pay

## 2023-10-29 DIAGNOSIS — D631 Anemia in chronic kidney disease: Secondary | ICD-10-CM | POA: Diagnosis not present

## 2023-10-29 DIAGNOSIS — Z8739 Personal history of other diseases of the musculoskeletal system and connective tissue: Secondary | ICD-10-CM | POA: Diagnosis not present

## 2023-10-29 DIAGNOSIS — K219 Gastro-esophageal reflux disease without esophagitis: Secondary | ICD-10-CM | POA: Insufficient documentation

## 2023-10-29 DIAGNOSIS — Z79899 Other long term (current) drug therapy: Secondary | ICD-10-CM | POA: Diagnosis not present

## 2023-10-29 DIAGNOSIS — D72829 Elevated white blood cell count, unspecified: Secondary | ICD-10-CM | POA: Diagnosis not present

## 2023-10-29 DIAGNOSIS — M79602 Pain in left arm: Secondary | ICD-10-CM | POA: Diagnosis not present

## 2023-10-29 DIAGNOSIS — I2510 Atherosclerotic heart disease of native coronary artery without angina pectoris: Secondary | ICD-10-CM | POA: Insufficient documentation

## 2023-10-29 DIAGNOSIS — N183 Chronic kidney disease, stage 3 unspecified: Secondary | ICD-10-CM | POA: Insufficient documentation

## 2023-10-29 DIAGNOSIS — R911 Solitary pulmonary nodule: Secondary | ICD-10-CM | POA: Insufficient documentation

## 2023-10-29 DIAGNOSIS — R079 Chest pain, unspecified: Principal | ICD-10-CM | POA: Diagnosis present

## 2023-10-29 DIAGNOSIS — E785 Hyperlipidemia, unspecified: Secondary | ICD-10-CM | POA: Diagnosis present

## 2023-10-29 DIAGNOSIS — Z87891 Personal history of nicotine dependence: Secondary | ICD-10-CM | POA: Insufficient documentation

## 2023-10-29 DIAGNOSIS — R0789 Other chest pain: Principal | ICD-10-CM | POA: Insufficient documentation

## 2023-10-29 DIAGNOSIS — I739 Peripheral vascular disease, unspecified: Secondary | ICD-10-CM | POA: Insufficient documentation

## 2023-10-29 DIAGNOSIS — D649 Anemia, unspecified: Secondary | ICD-10-CM | POA: Insufficient documentation

## 2023-10-29 DIAGNOSIS — Z8551 Personal history of malignant neoplasm of bladder: Secondary | ICD-10-CM | POA: Diagnosis not present

## 2023-10-29 DIAGNOSIS — Z7982 Long term (current) use of aspirin: Secondary | ICD-10-CM | POA: Diagnosis not present

## 2023-10-29 DIAGNOSIS — C678 Malignant neoplasm of overlapping sites of bladder: Secondary | ICD-10-CM | POA: Diagnosis not present

## 2023-10-29 DIAGNOSIS — I129 Hypertensive chronic kidney disease with stage 1 through stage 4 chronic kidney disease, or unspecified chronic kidney disease: Secondary | ICD-10-CM | POA: Diagnosis not present

## 2023-10-29 DIAGNOSIS — J449 Chronic obstructive pulmonary disease, unspecified: Secondary | ICD-10-CM | POA: Diagnosis present

## 2023-10-29 DIAGNOSIS — I10 Essential (primary) hypertension: Secondary | ICD-10-CM

## 2023-10-29 DIAGNOSIS — J439 Emphysema, unspecified: Secondary | ICD-10-CM | POA: Insufficient documentation

## 2023-10-29 LAB — CBC
HCT: 38.4 % — ABNORMAL LOW (ref 39.0–52.0)
HCT: 36.1 % — ABNORMAL LOW (ref 39.0–52.0)
Hemoglobin: 12.5 g/dL — ABNORMAL LOW (ref 13.0–17.0)
Hemoglobin: 11.9 g/dL — ABNORMAL LOW (ref 13.0–17.0)
MCH: 29.6 pg (ref 26.0–34.0)
MCH: 29.8 pg (ref 26.0–34.0)
MCHC: 32.6 g/dL (ref 30.0–36.0)
MCHC: 33 g/dL (ref 30.0–36.0)
MCV: 91 fL (ref 80.0–100.0)
MCV: 90.3 fL (ref 80.0–100.0)
Platelets: 224 K/uL (ref 150–400)
Platelets: 195 K/uL (ref 150–400)
RBC: 4.22 MIL/uL (ref 4.22–5.81)
RBC: 4 MIL/uL — ABNORMAL LOW (ref 4.22–5.81)
RDW: 13.8 % (ref 11.5–15.5)
RDW: 13.8 % (ref 11.5–15.5)
WBC: 11 K/uL — ABNORMAL HIGH (ref 4.0–10.5)
WBC: 9.5 K/uL (ref 4.0–10.5)
nRBC: 0 % (ref 0.0–0.2)
nRBC: 0 % (ref 0.0–0.2)

## 2023-10-29 LAB — BASIC METABOLIC PANEL WITH GFR
Anion gap: 3 — ABNORMAL LOW (ref 5–15)
BUN: 21 mg/dL (ref 8–23)
CO2: 31 mmol/L (ref 22–32)
Calcium: 9 mg/dL (ref 8.9–10.3)
Chloride: 105 mmol/L (ref 98–111)
Creatinine, Ser: 1.87 mg/dL — ABNORMAL HIGH (ref 0.61–1.24)
GFR, Estimated: 37 mL/min — ABNORMAL LOW (ref >60)
Glucose, Bld: 95 mg/dL (ref 70–99)
Potassium: 4.1 mmol/L (ref 3.5–5.1)
Sodium: 139 mmol/L (ref 135–145)

## 2023-10-29 LAB — TROPONIN I (HIGH SENSITIVITY)
Troponin I (High Sensitivity): 9 ng/L (ref <18)
Troponin I (High Sensitivity): 10 ng/L (ref <18)

## 2023-10-29 LAB — CREATININE, SERUM
Creatinine, Ser: 1.83 mg/dL — ABNORMAL HIGH (ref 0.61–1.24)
GFR, Estimated: 38 mL/min — ABNORMAL LOW (ref >60)

## 2023-10-29 MED ORDER — SODIUM CHLORIDE 0.9 % IV BOLUS
500.0000 mL | Freq: Once | INTRAVENOUS | Status: AC
  Administered 2023-10-29: 500 mL via INTRAVENOUS

## 2023-10-29 MED ORDER — ENOXAPARIN SODIUM 40 MG/0.4ML IJ SOSY
40.0000 mg | PREFILLED_SYRINGE | INTRAMUSCULAR | Status: DC
  Filled 2023-10-29: qty 0.4

## 2023-10-29 MED ORDER — IOHEXOL 350 MG/ML SOLN
75.0000 mL | Freq: Once | INTRAVENOUS | Status: AC | PRN
  Administered 2023-10-29: 75 mL via INTRAVENOUS

## 2023-10-29 NOTE — ED Notes (Signed)
 Patient back from CT.

## 2023-10-29 NOTE — ED Provider Triage Note (Signed)
 Emergency Medicine Provider Triage Evaluation Note  Manuel Barrett , a 78 y.o. male  was evaluated in triage.  Pt complains of history of aortofemoral bypass, no other known coronary disease, presents with chest discomfort that started this morning when he was outside, got better when he went and rested.  He then had some discomfort in his left arm which is still present but no chest pain or shortness of breath at this time.  Noted to be hypertensive at 190/80.  Prior heart catheterization from long enough ago that we do not have records in the chart, he reports that he had a 65% obstruction in some artery that was not intervened upon at that time..  Review of Systems  Positive: Chest pain Negative: Shortness of breath coughing or fever.  He does have some shortness of breath with exertion over the last year  Physical Exam  BP (!) 190/80 (BP Location: Right Arm)   Pulse 60   Temp 98.2 F (36.8 C) (Oral)   Resp 18   Ht 1.778 m (5' 10)   Wt 80.7 kg   SpO2 99%   BMI 25.53 kg/m  Gen:   Awake, no distress   Resp:  Normal effort  MSK:   Moves extremities without difficulty  Other:  Mild edema at the ankles, symmetrical, chronic.  Cardiac and pulmonary exams unremarkable  Medical Decision Making  Medically screening exam initiated at 5:34 PM.  Appropriate orders placed.  Lynwood JONETTA Fair was informed that the remainder of the evaluation will be completed by another provider, this initial triage assessment does not replace that evaluation, and the importance of remaining in the ED until their evaluation is complete.  EKG does not show any acute ischemia but the patient has symptoms that are somewhat concerning with exertional chest pain and left arm pain, may need admission to the hospital evaluation with a provocative test, troponins ordered, request made that patient be brought back to an acute care room and not go back to the lobby   Cleotilde Rogue, MD 10/29/23 1735

## 2023-10-29 NOTE — ED Notes (Signed)
 CCMD called.

## 2023-10-29 NOTE — ED Triage Notes (Signed)
 The pt was having  chest pain approx one hour ago and painful in his lt arm  he had a bladder wash as chemo this am

## 2023-10-29 NOTE — H&P (Signed)
 History and Physical    Manuel Barrett FMW:[id number] DOB: July 15, 1945 DOA: 10/29/2023  Patient coming from: Home.  Chief Complaint: Chest pain.  HPI: Manuel Barrett is a 78 y.o. male with history of nonobstructive CAD per cardiac cath in 2015, history of PAD had unremarkable Myoview  in 2019, hypertension, chronic kidney disease stage III, GERD presents to the ER when patient started experiencing chest pain.  Patient states he was driving his car when he started develop left anterior chest pain.  Lasted for around 15 to 20 minutes.  Denies any associated shortness of breath productive cough fever chills.  ED Course: In the ER CT angiogram was negative for anything acute.  Labs show troponins were 9.  Creatinine 1.8 hemoglobin 12.5.  Patient admitted for further chest pain workup.  Review of Systems: As per HPI, rest all negative.   Past Medical History:  Diagnosis Date   Arthritis    Bladder tumor    CAD in native artery followed by pcp at the [facility] in [place] 11-13-2016 pt denies S&S   per cardiac cath 03-06-2014  by dr swaziland--  moderate non-obstructive cad   Cancer [facility])    bladder   Chronic kidney disease    Emphysema/COPD (HCC)    GERD (gastroesophageal reflux disease)    Gout    per pt currently stable 11-13-2016   History of kidney stones    History of MRSA infection    right hand abscess 09/ 2016   Hypertension    Left inguinal hernia    PAD (peripheral artery disease) (HCC) first dx 1994---  was followed by vascular dr dyane   s/p  fem-pop bypass graft 1994 and 2001/  [facility] [place] [facility]13-2016 denies s&s   Rotator cuff syndrome of left shoulder    Wears hearing aid in both ears     Past Surgical History:  Procedure Laterality Date   ANTERIOR CERVICAL DECOMP/DISCECTOMY FUSION  02/1999   AORTA - BILATERAL FEMORAL ARTERY BYPASS GRAFT  2001  dr dyane   APPENDECTOMY  1959   BLADDER SURGERY  11/2022   COLONOSCOPY N/A 02/06/2021   Procedure:  COLONOSCOPY;  Surgeon: Golda Claudis PENNER, MD;  Location: [facility];  Service: Endoscopy;  Laterality: N/A;  10:30   CYSTOSCOPY W/ RETROGRADES Bilateral 10/31/2019   Procedure: CYSTOSCOPY WITH BILATERAL RETROGRADE PYELOGRAM;  Surgeon: Watt Rush, MD;  Location: [facility];  Service: Urology;  Laterality: Bilateral;   CYSTOSCOPY WITH BIOPSY Bilateral 12/02/2021   Procedure: CYSTOSCOPY WITH BLADDER BIOPSY AND FULGURATION BILATERAL  RETROGRADES LEFT URETEROSCOPY  left stent placement;  Surgeon: Watt Rush, MD;  Location: [facility];  Service: Urology;  Laterality: Bilateral;  1HR FOR CASE   CYSTOSCOPY WITH STENT PLACEMENT Left 11/26/2016   Procedure: CYSTOSCOPY WITH left double J STENT PLACEMENT;  Surgeon: Watt Rush, MD;  Location: [facility];  Service: Urology;  Laterality: Left;   FEMORAL-POPLITEAL BYPASS GRAFT  1994   dr dyane   I & D EXTREMITY Right 01/09/2015   Procedure: IRRIGATION AND DEBRIDEMENT RIGHT THUMB/HAND ;  Surgeon: Elsie Mussel, MD;  Location: [facility] OR;  Service: Orthopedics;  Laterality: Right;   LEFT HEART CATHETERIZATION WITH CORONARY ANGIOGRAM N/A 03/06/2014   Procedure: LEFT HEART CATHETERIZATION WITH CORONARY ANGIOGRAM;  Surgeon: Peter M Swaziland, MD;  Location: [facility] CATH LAB;  Service: Cardiovascular;  Laterality: N/A;  midLAD 50-60%; D1 20-30%; mCFX 30%; pRCA 30%;  ef 55-65%   LUMBAR SPINE SURGERY  1980's  REVERSE SHOULDER ARTHROPLASTY Right 03/11/2023   Procedure: RIGHT REVERSE SHOULDER ARTHROPLASTY;  Surgeon: Melita Drivers, MD;  Location: [facility];  Service: Orthopedics;  Laterality: Right;    TRANSURETHRAL RESECTION OF BLADDER TUMOR N/A 11/26/2016   Procedure: TRANSURETHRAL RESECTION OF BLADDER TUMOR (TURBT)/ left ureteroscopy;  Surgeon: Watt Rush, MD;  Location: [facility];  Service: Urology;  Laterality: N/A;   TRANSURETHRAL RESECTION OF BLADDER TUMOR WITH MITOMYCIN -C N/A 10/31/2019   Procedure: TRANSURETHRAL RESECTION OF BLADDER TUMOR  WITH GEMCITABINE ;  Surgeon: Watt Rush, MD;  Location: [facility];  Service: Urology;  Laterality: N/A;     reports that he quit smoking about 31 years ago. His smoking use included cigarettes. He started smoking about 61 years ago. He has a 45 pack-year smoking history. He has never used smokeless tobacco. He reports that he does not currently use alcohol. He reports that he does not use drugs.  Allergies  Allergen Reactions   Bee Venom Swelling   Indocin [Indomethacin] Other (See Comments)    Nose bleeds   Lisinopril Hives    Family History  Problem Relation Age of Onset   Dementia Mother    Heart failure Father    COPD Father    Colon cancer Neg Hx     Prior to Admission medications   Medication Sig Start Date End Date Taking? Authorizing Provider  acetaminophen  (TYLENOL ) 500 MG tablet Take 500-1,000 mg by mouth every 6 (six) hours as needed (pain.).    [provider]  allopurinol  (ZYLOPRIM ) 100 MG tablet Take 100 mg by mouth every morning.     [provider]  amLODipine  (NORVASC ) 10 MG tablet Take 1 tablet (10 mg total) by mouth at bedtime. 12/19/19   [place], Peter M, MD  aspirin  EC 81 MG tablet Take 1 tablet (81 mg total) by mouth daily. 11/07/18   [place], Peter M, MD  atorvastatin  (LIPITOR) 80 MG tablet Take 40 mg by mouth at bedtime.    [provider]  bisoprolol  (ZEBETA ) 5 MG tablet TAKE ONE (1) TABLET EACH DAY 07/01/23   [place], Peter M, MD  cloNIDine  (CATAPRES ) 0.1 MG tablet TAKE ONE (1) TABLET THREE (3) TIMES EACH DAY 08/07/22   [place], Peter M, MD  cyclobenzaprine  (FLEXERIL ) 10 MG tablet Take 1 tablet (10 mg total) by mouth 3 (three) times daily as needed for muscle spasms. 03/11/23   Shuford, Randine, PA-C  EPINEPHrine 0.3 mg/0.3 mL IJ SOAJ injection INJECT 0.3 ML INTRAMUSCULARLY ONCE AS NEEDED 06/16/21   [provider]  hydrALAZINE  (APRESOLINE ) 50 MG tablet TAKE ONE (1) TABLET THREE (3) TIMES EACH DAY 06/16/21   [place], Peter M, MD   nitroGLYCERIN  (NITROSTAT ) 0.4 MG SL tablet Place 1 tablet (0.4 mg total) under the tongue every 5 (five) minutes as needed for chest pain. 03/09/14   [place], Peter M, MD  olmesartan  (BENICAR ) 40 MG tablet TAKE ONE (1) TABLET EACH DAY 08/03/23   [place], Peter M, MD  omeprazole  (PRILOSEC) 20 MG capsule Take 1 capsule (20 mg total) by mouth daily as needed (acid reflux). Patient taking differently: Take 20 mg by mouth daily. 08/08/15   Harl Jayson CROME, MD  ondansetron  (ZOFRAN ) 4 MG tablet Take 1 tablet (4 mg total) by mouth every 8 (eight) hours as needed for nausea or vomiting. 03/11/23   Shuford, Randine, PA-C  oxyCODONE -acetaminophen  (PERCOCET) 5-325 MG tablet Take 1 tablet by mouth every 4 (four) hours as needed (max 6 q). 03/11/23   Shuford, Randine, PA-C  Physical Exam: Constitutional: Moderately built and nourished. Vitals:   10/29/23 1932 10/29/23 2000 10/29/23 2030 10/29/23 2130  BP: (!) 156/80 (!) 154/87 (!) 168/69 (!) 169/73  Pulse: 63 63 65 64  Resp: (!) 22 12 19 18   Temp: 98.5 F (36.9 C)     TempSrc: Oral     SpO2: 100% 100% 96% 96%  Weight:      Height:       Eyes: Anicteric no pallor. ENMT: No discharge from the ears eyes nose and mouth. Neck: No mass felt.  No neck rigidity. Respiratory: No rhonchi or crepitations. Cardiovascular: S1 S2 heard. Abdomen: Soft nontender bowel sound present. Musculoskeletal: No edema. Skin: No rash. Neurologic: Alert awake oriented to time place and person.  Moves all extremities. Psychiatric: Appears normal.  Normal affect.   Labs on Admission: I have personally reviewed following labs and imaging studies  CBC: Recent Labs  Lab 10/29/23 1711  WBC 11.0*  HGB 12.5*  HCT 38.4*  MCV 91.0  PLT 224   Basic Metabolic Panel: Recent Labs  Lab 10/29/23 1711  NA 139  K 4.1  CL 105  CO2 31  GLUCOSE 95  BUN 21  CREATININE 1.87*  CALCIUM  9.0   GFR: Estimated Creatinine Clearance: 35.2 mL/min (A) (by C-G formula based on SCr  of 1.87 mg/dL (H)). Liver Function Tests: No results for input(s): AST, ALT, ALKPHOS, BILITOT, PROT, ALBUMIN in the last 168 hours. No results for input(s): LIPASE, AMYLASE in the last 168 hours. No results for input(s): AMMONIA in the last 168 hours. Coagulation Profile: No results for input(s): INR, PROTIME in the last 168 hours. Cardiac Enzymes: No results for input(s): CKTOTAL, CKMB, CKMBINDEX, TROPONINI in the last 168 hours. BNP (last 3 results) No results for input(s): PROBNP in the last 8760 hours. HbA1C: No results for input(s): HGBA1C in the last 72 hours. CBG: No results for input(s): GLUCAP in the last 168 hours. Lipid Profile: No results for input(s): CHOL, HDL, LDLCALC, TRIG, CHOLHDL, LDLDIRECT in the last 72 hours. Thyroid Function Tests: No results for input(s): TSH, T4TOTAL, FREET4, T3FREE, THYROIDAB in the last 72 hours. Anemia Panel: No results for input(s): VITAMINB12, FOLATE, FERRITIN, TIBC, IRON, RETICCTPCT in the last 72 hours. Urine analysis:    Component Value Date/Time   COLORURINE YELLOW 10/11/2016 1003   APPEARANCEUR CLEAR 10/11/2016 1003   APPEARANCEUR Clear 10/06/2016 1601   LABSPEC 1.015 10/11/2016 1003   PHURINE 6.0 10/11/2016 1003   GLUCOSEU NEGATIVE 10/11/2016 1003   HGBUR NEGATIVE 10/11/2016 1003   BILIRUBINUR NEGATIVE 10/11/2016 1003   BILIRUBINUR Negative 10/06/2016 1601   KETONESUR NEGATIVE 10/11/2016 1003   PROTEINUR 100 (A) 10/11/2016 1003   NITRITE NEGATIVE 10/11/2016 1003   LEUKOCYTESUR NEGATIVE 10/11/2016 1003   LEUKOCYTESUR 1+ (A) 10/06/2016 1601   Sepsis Labs: @LABRCNTIP (procalcitonin:4,lacticidven:4) )No results found for this or any previous visit (from the past 240 hours).   Radiological Exams on Admission: CT Angio Chest PE W and/or Wo Contrast Result Date: 10/29/2023 CLINICAL DATA:  Chest and arm pain EXAM: CT ANGIOGRAPHY CHEST WITH CONTRAST TECHNIQUE:  Multidetector CT imaging of the chest was performed using the standard protocol during bolus administration of intravenous contrast. Multiplanar CT image reconstructions and MIPs were obtained to evaluate the vascular anatomy. RADIATION DOSE REDUCTION: This exam was performed according to the departmental dose-optimization program which includes automated exposure control, adjustment of the mA and/or kV according to patient size and/or use of iterative reconstruction technique. CONTRAST:  75mL OMNIPAQUE  IOHEXOL  350  MG/ML SOLN COMPARISON:  Chest x-ray from earlier in the same day, CT from 10/04/2016 FINDINGS: Cardiovascular: Atherosclerotic calcifications of the thoracic aorta are noted. No aneurysmal dilatation or dissection is noted. Mild coronary calcifications are seen. The pulmonary artery shows a normal branching pattern bilaterally. No intraluminal filling defect to suggest pulmonary embolism is noted. Mediastinum/Nodes: Thoracic inlet is within normal limits. No sizable hilar or mediastinal adenopathy is noted. The esophagus is within normal limits. Lungs/Pleura: Lungs are well aerated bilaterally. Areas of scarring are seen bilaterally as well as emphysematous changes. Scattered small less than 5 mm nodules are noted. A more dominant 2 cm nodular area is noted best seen on image number 93 of series 6. It has some central decreased attenuation and some peripheral areas of increased attenuation. This may be related to scarring from prior aspiration. Short-term follow-up is recommended. No sizable effusion is noted. A focal area of subpleural fat is noted on the right slightly decreased in size from the prior exam. Upper Abdomen: Simple renal cyst is noted in the right kidney. No follow-up is recommended. Musculoskeletal: Degenerative changes of the thoracic spine are seen. No acute rib abnormality is noted. Review of the MIP images confirms the above findings. IMPRESSION: No evidence of pulmonary emboli. 2 cm  nodular area within the right lower lobe as described. This has a more inflammatory appearance although follow-up in 3 months is recommended to assess for stability/resolution. No other focal abnormality is noted. Aortic Atherosclerosis (ICD10-I70.0) and Emphysema (ICD10-J43.9). Electronically Signed   By: Oneil Devonshire M.D.   On: 10/29/2023 21:09   DG Chest 2 View Result Date: 10/29/2023 CLINICAL DATA:  Chest and left arm pain for 1 hour, initial encounter EXAM: CHEST - 2 VIEW COMPARISON:  11/12/2017 FINDINGS: Cardiac shadow is within normal limits. Focal eventration of the right hemidiaphragm is again noted. Right basilar atelectasis is seen. No acute bony abnormality is noted. Postsurgical changes in the right shoulder and cervical spine are seen. IMPRESSION: Minimal right basilar atelectasis. Electronically Signed   By: Oneil Devonshire M.D.   On: 10/29/2023 19:18    EKG: Independently reviewed.  Normal sinus rhythm with nonspecific ST changes.  Assessment/Plan Principal Problem:   Chest pain Active Problems:   Essential hypertension   Hyperlipidemia LDL goal <70   COPD GOLD II    PAD (peripheral artery disease) (HCC)    Chest pain with nonobstructive CAD per cardiac cath in 2015 and unremarkable Myoview  2019.  Presently chest pain-free.  Will consult cardiology. Hypertension on hydralazine , bisoprolol , ARB, amlodipine .  Clonidine .  Medications confirmed with patient's wife. Chronic kidney disease stage III creatinine at around baseline. Anemia follow CBC.  Likely could be from renal disease.  Check anemia panel with next blood draw. GERD on PPI. History of gout on allopurinol . Lung nodule seen on the CAT scan need follow-up within 3 months per radiology recommendation. Peripheral artery disease.  Since patient has chest pain will need further workup and more than 2 midnight stay.   DVT prophylaxis: Lovenox .   Code Status: Full code. Family Communication: Discussed with patient's  wife. Disposition Plan: Monitored bed. Consults called: Cardiology through epic. Admission status: Observation.

## 2023-10-29 NOTE — ED Triage Notes (Addendum)
 Pt to ED c/o chest pain and left arm pain and weakness that started aprox 1 hour ago. Pt reports intermittent in nautre. No other neuro deficits noted in triage.

## 2023-10-29 NOTE — ED Notes (Signed)
 Patient transported to CT

## 2023-10-29 NOTE — ED Provider Notes (Signed)
 [place] EMERGENCY DEPARTMENT AT [facility] Provider Note   CSN: [id number] Arrival date & time: 10/29/23  1641     Patient presents with: Chest Pain and Arm Pain (left)   Manuel Barrett is a 78 y.o. male.   Patient here after episode of chest pressure earlier this evening.  Resolved after several minutes and then he developed left arm pain shortly after that.  He has a history of high blood pressure, history of arterial femoral bypass, bladder cancer, CKD, COPD/former smoker.  He has not noticed any exertional symptoms here recently.  Has not noticed any major fatigue.  Denies any cough sputum production.  Denies any fever or chills.  Pain has mostly resolved but still has some discomfort down the arm.  The history is provided by the patient.       Prior to Admission medications   Medication Sig Start Date End Date Taking? Authorizing Provider  acetaminophen  (TYLENOL ) 500 MG tablet Take 500-1,000 mg by mouth every 6 (six) hours as needed (pain.).    [provider]  allopurinol  (ZYLOPRIM ) 100 MG tablet Take 100 mg by mouth every morning.     [provider]  amLODipine  (NORVASC ) 10 MG tablet Take 1 tablet (10 mg total) by mouth at bedtime. 12/19/19   [place], Peter M, MD  aspirin  EC 81 MG tablet Take 1 tablet (81 mg total) by mouth daily. 11/07/18   [place], Peter M, MD  atorvastatin  (LIPITOR) 80 MG tablet Take 40 mg by mouth at bedtime.    [provider]  bisoprolol  (ZEBETA ) 5 MG tablet TAKE ONE (1) TABLET EACH DAY 07/01/23   [place], Peter M, MD  cloNIDine  (CATAPRES ) 0.1 MG tablet TAKE ONE (1) TABLET THREE (3) TIMES EACH DAY 08/07/22   [place], Peter M, MD  cyclobenzaprine  (FLEXERIL ) 10 MG tablet Take 1 tablet (10 mg total) by mouth 3 (three) times daily as needed for muscle spasms. 03/11/23   Shuford, Randine, PA-C  EPINEPHrine 0.3 mg/0.3 mL IJ SOAJ injection INJECT 0.3 ML INTRAMUSCULARLY ONCE AS NEEDED 06/16/21   [provider]  hydrALAZINE   (APRESOLINE ) 50 MG tablet TAKE ONE (1) TABLET THREE (3) TIMES EACH DAY 06/16/21   [place], Peter M, MD  nitroGLYCERIN  (NITROSTAT ) 0.4 MG SL tablet Place 1 tablet (0.4 mg total) under the tongue every 5 (five) minutes as needed for chest pain. 03/09/14   [place], Peter M, MD  olmesartan  (BENICAR ) 40 MG tablet TAKE ONE (1) TABLET EACH DAY 08/03/23   [place], Peter M, MD  omeprazole  (PRILOSEC) 20 MG capsule Take 1 capsule (20 mg total) by mouth daily as needed (acid reflux). Patient taking differently: Take 20 mg by mouth daily. 08/08/15   Harl Jayson CROME, MD  ondansetron  (ZOFRAN ) 4 MG tablet Take 1 tablet (4 mg total) by mouth every 8 (eight) hours as needed for nausea or vomiting. 03/11/23   Shuford, Randine, PA-C  oxyCODONE -acetaminophen  (PERCOCET) 5-325 MG tablet Take 1 tablet by mouth every 4 (four) hours as needed (max 6 q). 03/11/23   Shuford, Randine, PA-C    Allergies: Bee venom, Indocin [indomethacin], and Lisinopril    Review of Systems  Updated Vital Signs BP (!) 154/87   Pulse 63   Temp 98.5 F (36.9 C) (Oral)   Resp 12   Ht 5' 10 (1.778 m)   Wt 80.7 kg   SpO2 100%   BMI 25.53 kg/m   Physical Exam Vitals and nursing note reviewed.  Constitutional:  General: He is not in acute distress.    Appearance: He is well-developed. He is not ill-appearing.  HENT:     Head: Normocephalic and atraumatic.  Eyes:     Extraocular Movements: Extraocular movements intact.     Conjunctiva/sclera: Conjunctivae normal.     Pupils: Pupils are equal, round, and reactive to light.  Cardiovascular:     Rate and Rhythm: Normal rate and regular rhythm.     Pulses:          Radial pulses are 2+ on the right side and 2+ on the left side.     Heart sounds: Normal heart sounds. No murmur heard. Pulmonary:     Effort: Pulmonary effort is normal. No respiratory distress.     Breath sounds: Normal breath sounds. No decreased breath sounds or wheezing.  Abdominal:     Palpations: Abdomen is soft.      Tenderness: There is no abdominal tenderness.  Musculoskeletal:        General: No swelling. Normal range of motion.     Cervical back: Normal range of motion and neck supple.     Right lower leg: No edema.     Left lower leg: No edema.  Skin:    General: Skin is warm and dry.     Capillary Refill: Capillary refill takes less than 2 seconds.  Neurological:     Mental Status: He is alert.  Psychiatric:        Mood and Affect: Mood normal.     (all labs ordered are listed, but only abnormal results are displayed) Labs Reviewed  BASIC METABOLIC PANEL WITH GFR - Abnormal; Notable for the following components:      Result Value   Creatinine, Ser 1.87 (*)    GFR, Estimated 37 (*)    Anion gap 3 (*)    All other components within normal limits  CBC - Abnormal; Notable for the following components:   WBC 11.0 (*)    Hemoglobin 12.5 (*)    HCT 38.4 (*)    All other components within normal limits  TROPONIN I (HIGH SENSITIVITY)  TROPONIN I (HIGH SENSITIVITY)    EKG: EKG Interpretation Date/Time:  Friday October 29 2023 16:52:31 EDT Ventricular Rate:  60 PR Interval:  190 QRS Duration:  90 QT Interval:  436 QTC Calculation: 436 R Axis:   22  Text Interpretation: Normal sinus rhythm When compared with ECG of 25-Feb-2023 09:45, PREVIOUS ECG IS PRESENT Confirmed by Ruthe Cornet [phone]) on 10/29/2023 7:17:58 PM  Radiology: CT Angio Chest PE W and/or Wo Contrast Result Date: 10/29/2023 CLINICAL DATA:  Chest and arm pain EXAM: CT ANGIOGRAPHY CHEST WITH CONTRAST TECHNIQUE: Multidetector CT imaging of the chest was performed using the standard protocol during bolus administration of intravenous contrast. Multiplanar CT image reconstructions and MIPs were obtained to evaluate the vascular anatomy. RADIATION DOSE REDUCTION: This exam was performed according to the departmental dose-optimization program which includes automated exposure control, adjustment of the mA and/or kV according to  patient size and/or use of iterative reconstruction technique. CONTRAST:  75mL OMNIPAQUE  IOHEXOL  350 MG/ML SOLN COMPARISON:  Chest x-ray from earlier in the same day, CT from 10/04/2016 FINDINGS: Cardiovascular: Atherosclerotic calcifications of the thoracic aorta are noted. No aneurysmal dilatation or dissection is noted. Mild coronary calcifications are seen. The pulmonary artery shows a normal branching pattern bilaterally. No intraluminal filling defect to suggest pulmonary embolism is noted. Mediastinum/Nodes: Thoracic inlet is within normal limits. No sizable hilar or mediastinal  adenopathy is noted. The esophagus is within normal limits. Lungs/Pleura: Lungs are well aerated bilaterally. Areas of scarring are seen bilaterally as well as emphysematous changes. Scattered small less than 5 mm nodules are noted. A more dominant 2 cm nodular area is noted best seen on image number 93 of series 6. It has some central decreased attenuation and some peripheral areas of increased attenuation. This may be related to scarring from prior aspiration. Short-term follow-up is recommended. No sizable effusion is noted. A focal area of subpleural fat is noted on the right slightly decreased in size from the prior exam. Upper Abdomen: Simple renal cyst is noted in the right kidney. No follow-up is recommended. Musculoskeletal: Degenerative changes of the thoracic spine are seen. No acute rib abnormality is noted. Review of the MIP images confirms the above findings. IMPRESSION: No evidence of pulmonary emboli. 2 cm nodular area within the right lower lobe as described. This has a more inflammatory appearance although follow-up in 3 months is recommended to assess for stability/resolution. No other focal abnormality is noted. Aortic Atherosclerosis (ICD10-I70.0) and Emphysema (ICD10-J43.9). Electronically Signed   By: Oneil Devonshire M.D.   On: 10/29/2023 21:09   DG Chest 2 View Result Date: 10/29/2023 CLINICAL DATA:  Chest and  left arm pain for 1 hour, initial encounter EXAM: CHEST - 2 VIEW COMPARISON:  11/12/2017 FINDINGS: Cardiac shadow is within normal limits. Focal eventration of the right hemidiaphragm is again noted. Right basilar atelectasis is seen. No acute bony abnormality is noted. Postsurgical changes in the right shoulder and cervical spine are seen. IMPRESSION: Minimal right basilar atelectasis. Electronically Signed   By: Oneil Devonshire M.D.   On: 10/29/2023 19:18     Procedures   Medications Ordered in the ED  sodium chloride  0.9 % bolus 500 mL (500 mLs Intravenous New Bag/Given 10/29/23 2009)  iohexol  (OMNIPAQUE ) 350 MG/ML injection 75 mL (75 mLs Intravenous Contrast Given 10/29/23 2046)                                    Medical Decision Making Amount and/or Complexity of Data Reviewed Radiology: ordered.  Risk Prescription drug management. Decision regarding hospitalization.   Lynwood JONETTA Fair is here with chest pain.  History of hypertension COPD smoking history, bladder cancer.  Patient arrives mildly hypertensive but otherwise normal vitals.  He had episode of chest discomfort today at rest lasting several minutes.  Went away.  Then he developed a little bit after that some pain in the left arm.  Pain mostly resolved now.  He has not noticed any exertional symptoms recently.  He had a stress test about 6 years ago that was unremarkable.  He does have multiple cardiac risk factors.  Differential diagnosis could be MSK related process but I am concerned for ACS PE seems less likely to be GI process.  CBC BMP troponin chest x-ray CT PE scan obtained.  Troponin within normal limits.  No significant leukocytosis.  Creatinine is at baseline.  PE scan unremarkable.  Does have some mild coronary calcifications.  Ultimately given his age and risk factors I think he benefit from observation stay for heart ultrasound and further cardiac workup.  Will admit to hospitalist for further care.  This chart was  dictated using voice recognition software.  Despite best efforts to proofread,  errors can occur which can change the documentation meaning.      Final diagnoses:  Chest pain, unspecified type    ED Discharge Orders     None          Ruthe Cornet, DO 10/29/23 2135

## 2023-10-30 ENCOUNTER — Observation Stay (HOSPITAL_COMMUNITY)

## 2023-10-30 DIAGNOSIS — Z8739 Personal history of other diseases of the musculoskeletal system and connective tissue: Secondary | ICD-10-CM

## 2023-10-30 DIAGNOSIS — K219 Gastro-esophageal reflux disease without esophagitis: Secondary | ICD-10-CM | POA: Insufficient documentation

## 2023-10-30 DIAGNOSIS — N1831 Chronic kidney disease, stage 3a: Secondary | ICD-10-CM

## 2023-10-30 DIAGNOSIS — R072 Precordial pain: Secondary | ICD-10-CM | POA: Diagnosis not present

## 2023-10-30 DIAGNOSIS — D649 Anemia, unspecified: Secondary | ICD-10-CM | POA: Insufficient documentation

## 2023-10-30 DIAGNOSIS — R0781 Pleurodynia: Secondary | ICD-10-CM

## 2023-10-30 DIAGNOSIS — E785 Hyperlipidemia, unspecified: Secondary | ICD-10-CM | POA: Diagnosis not present

## 2023-10-30 DIAGNOSIS — R079 Chest pain, unspecified: Secondary | ICD-10-CM

## 2023-10-30 DIAGNOSIS — N183 Chronic kidney disease, stage 3 unspecified: Secondary | ICD-10-CM | POA: Insufficient documentation

## 2023-10-30 DIAGNOSIS — D631 Anemia in chronic kidney disease: Secondary | ICD-10-CM | POA: Diagnosis not present

## 2023-10-30 DIAGNOSIS — I739 Peripheral vascular disease, unspecified: Secondary | ICD-10-CM | POA: Diagnosis not present

## 2023-10-30 DIAGNOSIS — R911 Solitary pulmonary nodule: Secondary | ICD-10-CM

## 2023-10-30 LAB — BASIC METABOLIC PANEL WITH GFR
Anion gap: 7 (ref 5–15)
BUN: 19 mg/dL (ref 8–23)
CO2: 24 mmol/L (ref 22–32)
Calcium: 8.7 mg/dL — ABNORMAL LOW (ref 8.9–10.3)
Chloride: 108 mmol/L (ref 98–111)
Creatinine, Ser: 1.94 mg/dL — ABNORMAL HIGH (ref 0.61–1.24)
GFR, Estimated: 35 mL/min — ABNORMAL LOW (ref >60)
Glucose, Bld: 94 mg/dL (ref 70–99)
Potassium: 4.2 mmol/L (ref 3.5–5.1)
Sodium: 139 mmol/L (ref 135–145)

## 2023-10-30 LAB — CBC
HCT: 36.2 % — ABNORMAL LOW (ref 39.0–52.0)
Hemoglobin: 12 g/dL — ABNORMAL LOW (ref 13.0–17.0)
MCH: 30.2 pg (ref 26.0–34.0)
MCHC: 33.1 g/dL (ref 30.0–36.0)
MCV: 91 fL (ref 80.0–100.0)
Platelets: 193 K/uL (ref 150–400)
RBC: 3.98 MIL/uL — ABNORMAL LOW (ref 4.22–5.81)
RDW: 13.8 % (ref 11.5–15.5)
WBC: 7.6 K/uL (ref 4.0–10.5)
nRBC: 0 % (ref 0.0–0.2)

## 2023-10-30 LAB — ECHOCARDIOGRAM COMPLETE
Area-P 1/2: 3.03 cm2
Height: 70 in
S' Lateral: 3.2 cm
Weight: 2846.58 [oz_av]

## 2023-10-30 MED ORDER — AMLODIPINE BESYLATE 5 MG PO TABS
10.0000 mg | ORAL_TABLET | Freq: Every day | ORAL | Status: DC
  Administered 2023-10-30: 10 mg via ORAL
  Filled 2023-10-30: qty 2

## 2023-10-30 MED ORDER — BISOPROLOL FUMARATE 5 MG PO TABS
5.0000 mg | ORAL_TABLET | Freq: Every day | ORAL | Status: DC
  Filled 2023-10-30: qty 1

## 2023-10-30 MED ORDER — IRBESARTAN 300 MG PO TABS
300.0000 mg | ORAL_TABLET | Freq: Every day | ORAL | Status: DC
  Filled 2023-10-30: qty 1

## 2023-10-30 MED ORDER — ALLOPURINOL 100 MG PO TABS
100.0000 mg | ORAL_TABLET | Freq: Every day | ORAL | Status: DC
  Administered 2023-10-30: 100 mg via ORAL
  Filled 2023-10-30: qty 1

## 2023-10-30 MED ORDER — CLONIDINE HCL 0.1 MG PO TABS
0.1000 mg | ORAL_TABLET | Freq: Three times a day (TID) | ORAL | Status: DC
  Administered 2023-10-30: 0.1 mg via ORAL
  Filled 2023-10-30: qty 1

## 2023-10-30 MED ORDER — HYDRALAZINE HCL 50 MG PO TABS
50.0000 mg | ORAL_TABLET | Freq: Three times a day (TID) | ORAL | Status: DC
  Administered 2023-10-30: 50 mg via ORAL
  Filled 2023-10-30: qty 1

## 2023-10-30 NOTE — Discharge Summary (Signed)
 Physician Discharge Summary  [name] FMW:[id number] DOB: [birth year] DOA: 10/29/2023  PCP: [facility]  Admit date: 10/29/2023 Discharge date: 10/30/2023  Admitted From: Home Disposition: Home  Recommendations for Outpatient Follow-up:  Follow up with PCP in 1-2 weeks Follow-up with cardiology as scheduled  Home Health: None Equipment/Devices: None  Discharge Condition: Stable CODE STATUS: Full Diet recommendation: Low-salt low-fat diet  Brief/Interim Summary: Manuel Barrett is a 78 y.o. male with history of nonobstructive CAD per cardiac cath in 2015, history of PAD had unremarkable Myoview  in 2019, hypertension, chronic kidney disease stage III, GERD presents to the ER when patient started experiencing chest pain/pressure that lasted about 15 minutes.  Patient admitted as above with concerns for ACS given symptoms however with normal troponin, EKG without overt ST elevations and echo showing grade 1 diastolic dysfunction but otherwise without wall motion abnormality discussed with cardiology and plan for discharge home with outpatient cardiology follow-up, he will need a stress test in the next few weeks.  Discussed with cardiology, appreciate their insight and help with this patient.  Patient otherwise stable and agreeable for discharge home.     Discharge Diagnoses:  Principal Problem:   Chest pain Active Problems:   Essential hypertension   Hyperlipidemia LDL goal <70   PAD (peripheral artery disease) (HCC)   CKD (chronic kidney disease) stage 3, GFR 30-59 ml/min (HCC)   Anemia   GERD (gastroesophageal reflux disease)   History of gout   Lung nodule    Discharge Instructions  Discharge Instructions     Call MD for:  difficulty breathing, headache or visual disturbances   Complete by: As directed    Call MD for:  extreme fatigue   Complete by: As directed    Call MD for:  hives   Complete by: As directed    Call MD for:  persistant dizziness or  light-headedness   Complete by: As directed    Call MD for:  persistant nausea and vomiting   Complete by: As directed    Call MD for:  severe uncontrolled pain   Complete by: As directed    Call MD for:  temperature >100.4   Complete by: As directed    Diet - low sodium heart healthy   Complete by: As directed    Discharge instructions   Complete by: As directed    Return to the hospital if worsening chest pain or pressure   Increase activity slowly   Complete by: As directed       Allergies as of 10/30/2023       Reactions   Bee Venom Swelling   Indocin [indomethacin] Other (See Comments)   Nose bleeds   Lisinopril Hives        Medication List     TAKE these medications    allopurinol  100 MG tablet Commonly known as: ZYLOPRIM  Take 100 mg by mouth every morning.   amLODipine  10 MG tablet Commonly known as: NORVASC  Take 1 tablet (10 mg total) by mouth at bedtime.   aspirin  EC 81 MG tablet Take 1 tablet (81 mg total) by mouth daily.   atorvastatin  40 MG tablet Commonly known as: LIPITOR Take 40 mg by mouth at bedtime.   bisoprolol  5 MG tablet Commonly known as: ZEBETA  TAKE ONE (1) TABLET EACH DAY   cloNIDine  0.1 MG tablet Commonly known as: CATAPRES  TAKE ONE (1) TABLET THREE (3) TIMES EACH DAY   colchicine  0.6 MG tablet Take 1.2 mg by mouth as  needed (gout). Take two tablets (1.2mg ) at onset of gout attack.  May take one tablet (0.6mg ) an hour later if needed.  Maximum of 3 tablets in 3 days.   EPINEPHrine 0.3 mg/0.3 mL Soaj injection Commonly known as: EPI-PEN INJECT 0.3 ML INTRAMUSCULARLY ONCE AS NEEDED   hydrALAZINE  50 MG tablet Commonly known as: APRESOLINE  TAKE ONE (1) TABLET THREE (3) TIMES EACH DAY   hydrochlorothiazide 25 MG tablet Commonly known as: HYDRODIURIL Take 12.5 mg by mouth daily.   nitroGLYCERIN  0.4 MG SL tablet Commonly known as: NITROSTAT  Place 1 tablet (0.4 mg total) under the tongue every 5 (five) minutes as needed for  chest pain.   olmesartan  40 MG tablet Commonly known as: BENICAR  TAKE ONE (1) TABLET EACH DAY   omeprazole  20 MG capsule Commonly known as: PRILOSEC Take 1 capsule (20 mg total) by mouth daily as needed (acid reflux). What changed: when to take this        Allergies  Allergen Reactions   Bee Venom Swelling   Indocin [Indomethacin] Other (See Comments)    Nose bleeds   Lisinopril Hives    Consultations: Cardiology   Procedures/Studies: ECHOCARDIOGRAM COMPLETE Result Date: 10/30/2023    ECHOCARDIOGRAM REPORT   Patient Name:   [name] Date of Exam: 10/30/2023 Medical Rec #:  [id number]     Height:       70.0 in Accession #:    [id number]    Weight:       177.9 lb Date of Birth:  08-Mar-1946    BSA:          1.986 m Patient Age:    77 years      BP:           158/59 mmHg Patient Gender: M             HR:           53 bpm. Exam Location:  Inpatient Procedure: 2D Echo, Cardiac Doppler and Color Doppler (Both Spectral and Color            Flow Doppler were utilized during procedure). Indications:    Chest Pain  History:        Patient has no prior history of Echocardiogram examinations.  Sonographer:    Philomena Daring Referring Phys: ANGELA NICOLE DUKE IMPRESSIONS  1. Left ventricular ejection fraction, by estimation, is 55 to 60%. The left ventricle has normal function. The left ventricle has no regional wall motion abnormalities. Left ventricular diastolic parameters are consistent with Grade I diastolic dysfunction (impaired relaxation).  2. Right ventricular systolic function is normal. The right ventricular size is normal.  3. The mitral valve is abnormal. Trivial mitral valve regurgitation. No evidence of mitral stenosis.  4. The aortic valve is tricuspid. There is moderate calcification of the aortic valve. There is moderate thickening of the aortic valve. Aortic valve regurgitation is not visualized. Aortic valve sclerosis/calcification is present, without any evidence of aortic  stenosis.  5. The inferior vena cava is normal in size with greater than 50% respiratory variability, suggesting right atrial pressure of 3 mmHg. FINDINGS  Left Ventricle: Left ventricular ejection fraction, by estimation, is 55 to 60%. The left ventricle has normal function. The left ventricle has no regional wall motion abnormalities. Strain was performed and the global longitudinal strain is indeterminate. The left ventricular internal cavity size was normal in size. There is no left ventricular hypertrophy. Left ventricular diastolic parameters are consistent with Grade I diastolic dysfunction (  impaired relaxation). Right Ventricle: The right ventricular size is normal. No increase in right ventricular wall thickness. Right ventricular systolic function is normal. Left Atrium: Left atrial size was normal in size. Right Atrium: Right atrial size was normal in size. Pericardium: There is no evidence of pericardial effusion. Mitral Valve: The mitral valve is abnormal. There is mild thickening of the mitral valve leaflet(s). There is mild calcification of the mitral valve leaflet(s). Mild mitral annular calcification. Trivial mitral valve regurgitation. No evidence of mitral valve stenosis. Tricuspid Valve: The tricuspid valve is normal in structure. Tricuspid valve regurgitation is trivial. No evidence of tricuspid stenosis. Aortic Valve: The aortic valve is tricuspid. There is moderate calcification of the aortic valve. There is moderate thickening of the aortic valve. Aortic valve regurgitation is not visualized. Aortic valve sclerosis/calcification is present, without any  evidence of aortic stenosis. Pulmonic Valve: The pulmonic valve was normal in structure. Pulmonic valve regurgitation is not visualized. No evidence of pulmonic stenosis. Aorta: The aortic root is normal in size and structure. Venous: The inferior vena cava is normal in size with greater than 50% respiratory variability, suggesting right  atrial pressure of 3 mmHg. IAS/Shunts: No atrial level shunt detected by color flow Doppler. Additional Comments: 3D was performed not requiring image post processing on an independent workstation and was indeterminate.  LEFT VENTRICLE PLAX 2D LVIDd:         4.90 cm   Diastology LVIDs:         3.20 cm   LV e' medial:    7.94 cm/s LV PW:         1.20 cm   LV E/e' medial:  8.7 LV IVS:        1.10 cm   LV e' lateral:   10.30 cm/s LVOT diam:     2.00 cm   LV E/e' lateral: 6.7 LV SV:         90 LV SV Index:   46 LVOT Area:     3.14 cm  RIGHT VENTRICLE             IVC RV S prime:     11.70 cm/s  IVC diam: 1.60 cm TAPSE (M-mode): 2.1 cm LEFT ATRIUM             Index        RIGHT ATRIUM           Index LA diam:        3.60 cm 1.81 cm/m   RA Area:     16.20 cm LA Vol (A2C):   44.5 ml 22.41 ml/m  RA Volume:   40.50 ml  20.39 ml/m LA Vol (A4C):   38.9 ml 19.59 ml/m LA Biplane Vol: 45.9 ml 23.11 ml/m  AORTIC VALVE LVOT Vmax:   110.00 cm/s LVOT Vmean:  72.800 cm/s LVOT VTI:    0.288 m  AORTA Ao Root diam: 2.90 cm Ao Asc diam:  2.80 cm MITRAL VALVE MV Area (PHT): 3.03 cm     SHUNTS MV Decel Time: 250 msec     Systemic VTI:  0.29 m MV E velocity: 69.20 cm/s   Systemic Diam: 2.00 cm MV A velocity: 104.00 cm/s MV E/A ratio:  0.67 Maude Emmer MD Electronically signed by Maude Emmer MD Signature Date/Time: 10/30/2023/11:04:39 AM    Final    CT Angio Chest PE W and/or Wo Contrast Result Date: 10/29/2023 CLINICAL DATA:  Chest and arm pain EXAM: CT ANGIOGRAPHY CHEST WITH CONTRAST TECHNIQUE: Multidetector  CT imaging of the chest was performed using the standard protocol during bolus administration of intravenous contrast. Multiplanar CT image reconstructions and MIPs were obtained to evaluate the vascular anatomy. RADIATION DOSE REDUCTION: This exam was performed according to the departmental dose-optimization program which includes automated exposure control, adjustment of the mA and/or kV according to patient size and/or use  of iterative reconstruction technique. CONTRAST:  75mL OMNIPAQUE  IOHEXOL  350 MG/ML SOLN COMPARISON:  Chest x-ray from earlier in the same day, CT from 10/04/2016 FINDINGS: Cardiovascular: Atherosclerotic calcifications of the thoracic aorta are noted. No aneurysmal dilatation or dissection is noted. Mild coronary calcifications are seen. The pulmonary artery shows a normal branching pattern bilaterally. No intraluminal filling defect to suggest pulmonary embolism is noted. Mediastinum/Nodes: Thoracic inlet is within normal limits. No sizable hilar or mediastinal adenopathy is noted. The esophagus is within normal limits. Lungs/Pleura: Lungs are well aerated bilaterally. Areas of scarring are seen bilaterally as well as emphysematous changes. Scattered small less than 5 mm nodules are noted. A more dominant 2 cm nodular area is noted best seen on image number 93 of series 6. It has some central decreased attenuation and some peripheral areas of increased attenuation. This may be related to scarring from prior aspiration. Short-term follow-up is recommended. No sizable effusion is noted. A focal area of subpleural fat is noted on the right slightly decreased in size from the prior exam. Upper Abdomen: Simple renal cyst is noted in the right kidney. No follow-up is recommended. Musculoskeletal: Degenerative changes of the thoracic spine are seen. No acute rib abnormality is noted. Review of the MIP images confirms the above findings. IMPRESSION: No evidence of pulmonary emboli. 2 cm nodular area within the right lower lobe as described. This has a more inflammatory appearance although follow-up in 3 months is recommended to assess for stability/resolution. No other focal abnormality is noted. Aortic Atherosclerosis (ICD10-I70.0) and Emphysema (ICD10-J43.9). Electronically Signed   By: Oneil Devonshire M.D.   On: 10/29/2023 21:09   DG Chest 2 View Result Date: 10/29/2023 CLINICAL DATA:  Chest and left arm pain for 1  hour, initial encounter EXAM: CHEST - 2 VIEW COMPARISON:  11/12/2017 FINDINGS: Cardiac shadow is within normal limits. Focal eventration of the right hemidiaphragm is again noted. Right basilar atelectasis is seen. No acute bony abnormality is noted. Postsurgical changes in the right shoulder and cervical spine are seen. IMPRESSION: Minimal right basilar atelectasis. Electronically Signed   By: Oneil Devonshire M.D.   On: 10/29/2023 19:18     Subjective: No acute issues/events overnight   Discharge Exam: Vitals:   10/30/23 0600 10/30/23 0920  BP: (!) 134/49 (!) 159/68  Pulse: (!) 53 (!) 53  Resp: 17 12  Temp:    SpO2: 97% 99%   Vitals:   10/30/23 0400 10/30/23 0538 10/30/23 0600 10/30/23 0920  BP: (!) 134/50 (!) 149/62 (!) 134/49 (!) 159/68  Pulse: (!) 59  (!) 53 (!) 53  Resp: 17  17 12   Temp: 98.3 F (36.8 C)     TempSrc: Oral     SpO2: 95%  97% 99%  Weight:      Height:        General: Pt is alert, awake, not in acute distress Cardiovascular: RRR, S1/S2 +, no rubs, no gallops Respiratory: CTA bilaterally, no wheezing, no rhonchi Abdominal: Soft, NT, ND, bowel sounds + Extremities: no edema, no cyanosis    The results of significant diagnostics from this hospitalization (including imaging, microbiology, ancillary and laboratory)  are listed below for reference.     Microbiology: No results found for this or any previous visit (from the past 240 hours).   Labs: BNP (last 3 results) No results for input(s): BNP in the last 8760 hours. Basic Metabolic Panel: Recent Labs  Lab 10/29/23 1711 10/29/23 2312 10/30/23 0452  NA 139  --  139  K 4.1  --  4.2  CL 105  --  108  CO2 31  --  24  GLUCOSE 95  --  94  BUN 21  --  19  CREATININE 1.87* 1.83* 1.94*  CALCIUM  9.0  --  8.7*   Liver Function Tests: No results for input(s): AST, ALT, ALKPHOS, BILITOT, PROT, ALBUMIN in the last 168 hours. No results for input(s): LIPASE, AMYLASE in the last 168  hours. No results for input(s): AMMONIA in the last 168 hours. CBC: Recent Labs  Lab 10/29/23 1711 10/29/23 2312 10/30/23 0452  WBC 11.0* 9.5 7.6  HGB 12.5* 11.9* 12.0*  HCT 38.4* 36.1* 36.2*  MCV 91.0 90.3 91.0  PLT 224 195 193   Cardiac Enzymes: No results for input(s): CKTOTAL, CKMB, CKMBINDEX, TROPONINI in the last 168 hours. BNP: Invalid input(s): POCBNP CBG: No results for input(s): GLUCAP in the last 168 hours. D-Dimer No results for input(s): DDIMER in the last 72 hours. Hgb A1c No results for input(s): HGBA1C in the last 72 hours. Lipid Profile No results for input(s): CHOL, HDL, LDLCALC, TRIG, CHOLHDL, LDLDIRECT in the last 72 hours. Thyroid function studies No results for input(s): TSH, T4TOTAL, T3FREE, THYROIDAB in the last 72 hours.  Invalid input(s): FREET3 Anemia work up No results for input(s): VITAMINB12, FOLATE, FERRITIN, TIBC, IRON, RETICCTPCT in the last 72 hours. Urinalysis    Component Value Date/Time   COLORURINE YELLOW 10/11/2016 1003   APPEARANCEUR CLEAR 10/11/2016 1003   APPEARANCEUR Clear 10/06/2016 1601   LABSPEC 1.015 10/11/2016 1003   PHURINE 6.0 10/11/2016 1003   GLUCOSEU NEGATIVE 10/11/2016 1003   HGBUR NEGATIVE 10/11/2016 1003   BILIRUBINUR NEGATIVE 10/11/2016 1003   BILIRUBINUR Negative 10/06/2016 1601   KETONESUR NEGATIVE 10/11/2016 1003   PROTEINUR 100 (A) 10/11/2016 1003   NITRITE NEGATIVE 10/11/2016 1003   LEUKOCYTESUR NEGATIVE 10/11/2016 1003   LEUKOCYTESUR 1+ (A) 10/06/2016 1601   Sepsis Labs Recent Labs  Lab 10/29/23 1711 10/29/23 2312 10/30/23 0452  WBC 11.0* 9.5 7.6   Microbiology No results found for this or any previous visit (from the past 240 hours).   Time coordinating discharge: Over 30 minutes  SIGNED:   Elsie JAYSON [place], DO [facility] 10/30/2023, 12:01 PM Pager   If 7PM-7AM, please contact night-coverage [email or web address]

## 2023-10-30 NOTE — Consult Note (Addendum)
 Cardiology Consultation   Patient ID: Manuel Barrett MRN: [id number]; DOB: 1945-06-15  Admit date: 10/29/2023 Date of Consult: 10/30/2023  PCP:  Clinic, [facility] Providers Cardiologist:  Peter Swaziland, MD      Patient Profile: Manuel Barrett is a 78 y.o. male with a hx of nonobstructive CAD, HTN, CKD III, PAD, bladder cancer, and GERD who is being seen 10/30/2023 for the evaluation of chest pain at the request of Dr. Lue.  History of Present Illness:  Manuel Barrett has a history of nonobstructive CAD on heart cath in 2015. He also has PAD and is s/p fem-fem bypass graft. Last myoview  08/2017 was reassuring, no ischemia, and LVEF 55-65%.   He was last seen by cardiology 02/2023 and cleared for shoulder surgery.   He was diagnosed with bladder cancer and underwent chemotherapy in 2018-2019. Recently found to have a recurrence with cystoscopy. He was advised to restart bladder washes with chemotherapy and had his first session yesterday, 10/29/23.   He presented to [facility] with chest pain on 10/29/23. CP started yesterday when he was driving his car and lasted 15-20 min. CP was on his left anterior chest without radiation, diaphoresis, or SOB. Chest pain resolved and he drove home. Later at home, he developed radiating pain down his left arm into his hand. Both episodes lasted about 15 min and rated as a 5/10. He has been chest pain free since being in the ER.    HS troponin x 2 negative, initial EKG with nonspecific ST changes, repeat EKG pending.   sCr 1.94 (1.87) - up from baseline of probably 1.6.  Cardiology consulted for further evaluation.   He is presently chest pain free. He has not had this chest pain before. Interesting that this developed on his first chemo session for recurrent bladder cancer.   His wife is bedside and helps with history. At baseline, he remains active and is able to complete more than 4.0 METS without angina (just weed-eated the  cemetery for 1 hr).    Past Medical History:  Diagnosis Date   Arthritis    Bladder tumor    CAD in native artery followed by pcp at the [facility] in [place] 11-13-2016 pt denies S&S   per cardiac cath 03-06-2014  by dr swaziland--  moderate non-obstructive cad   Cancer [facility])    bladder   Chronic kidney disease    Emphysema/COPD (HCC)    GERD (gastroesophageal reflux disease)    Gout    per pt currently stable 11-13-2016   History of kidney stones    History of MRSA infection    right hand abscess 09/ 2016   Hypertension    Left inguinal hernia    PAD (peripheral artery disease) (HCC) first dx 1994---  was followed by vascular dr dyane   s/p  fem-pop bypass graft 1994 and 2001/  [facility] [place] 11-13-2016 denies s&s   Rotator cuff syndrome of left shoulder    Wears hearing aid in both ears     Past Surgical History:  Procedure Laterality Date   ANTERIOR CERVICAL DECOMP/DISCECTOMY FUSION  02/1999   AORTA - BILATERAL FEMORAL ARTERY BYPASS GRAFT  2001  dr dyane   APPENDECTOMY  1959   BLADDER SURGERY  11/2022   COLONOSCOPY N/A 02/06/2021   Procedure: COLONOSCOPY;  Surgeon: Golda Claudis PENNER, MD;  [facility];  Service: Endoscopy;  Laterality: N/A;  10:30   CYSTOSCOPY  W/ RETROGRADES Bilateral 10/31/2019   Procedure: CYSTOSCOPY WITH BILATERAL RETROGRADE PYELOGRAM;  Surgeon: Watt Rush, MD;  Location: [facility];  Service: Urology;  Laterality: Bilateral;   CYSTOSCOPY WITH BIOPSY Bilateral 12/02/2021   Procedure: CYSTOSCOPY WITH BLADDER BIOPSY AND FULGURATION BILATERAL  RETROGRADES LEFT URETEROSCOPY  left stent placement;  Surgeon: Watt Rush, MD;  Location: [facility];  Service: Urology;  Laterality: Bilateral;  1HR FOR CASE   CYSTOSCOPY WITH STENT PLACEMENT Left 11/26/2016   Procedure: CYSTOSCOPY WITH left double J STENT PLACEMENT;  Surgeon: Watt Rush, MD;  [facility];  Service: Urology;  Laterality: Left;   FEMORAL-POPLITEAL  BYPASS GRAFT  1994   dr dyane   I & D EXTREMITY Right 01/09/2015   Procedure: IRRIGATION AND DEBRIDEMENT RIGHT THUMB/HAND ;  Surgeon: Elsie Mussel, MD;  Location: [facility];  Service: Orthopedics;  Laterality: Right;   LEFT HEART CATHETERIZATION WITH CORONARY ANGIOGRAM N/A 03/06/2014   Procedure: LEFT HEART CATHETERIZATION WITH CORONARY ANGIOGRAM;  Surgeon: Peter M Swaziland, MD;  Location: [facility] CATH LAB;  Service: Cardiovascular;  Laterality: N/A;  midLAD 50-60%; D1 20-30%; mCFX 30%; pRCA 30%;  ef 55-65%   LUMBAR SPINE SURGERY  1980's   REVERSE SHOULDER ARTHROPLASTY Right 03/11/2023   Procedure: RIGHT REVERSE SHOULDER ARTHROPLASTY;  Surgeon: Melita Drivers, MD;  Location: [facility];  Service: Orthopedics;  Laterality: Right;    TRANSURETHRAL RESECTION OF BLADDER TUMOR N/A 11/26/2016   Procedure: TRANSURETHRAL RESECTION OF BLADDER TUMOR (TURBT)/ left ureteroscopy;  Surgeon: Watt Rush, MD;  Location: [facility];  Service: Urology;  Laterality: N/A;   TRANSURETHRAL RESECTION OF BLADDER TUMOR WITH MITOMYCIN -C N/A 10/31/2019   Procedure: TRANSURETHRAL RESECTION OF BLADDER TUMOR WITH GEMCITABINE ;  Surgeon: Watt Rush, MD;  Location: [facility];  Service: Urology;  Laterality: N/A;     Home Medications:  Prior to Admission medications   Medication Sig Start Date End Date Taking? Authorizing Provider  allopurinol  (ZYLOPRIM ) 100 MG tablet Take 100 mg by mouth every morning.    Yes [provider]  amLODipine  (NORVASC ) 10 MG tablet Take 1 tablet (10 mg total) by mouth at bedtime. 12/19/19  Yes Swaziland, Peter M, MD  aspirin  EC 81 MG tablet Take 1 tablet (81 mg total) by mouth daily. 11/07/18  Yes Swaziland, Peter M, MD  atorvastatin  (LIPITOR) 40 MG tablet Take 40 mg by mouth at bedtime.   Yes [provider]  bisoprolol  (ZEBETA ) 5 MG tablet TAKE ONE (1) TABLET EACH DAY 07/01/23  Yes Swaziland, Peter M, MD  cloNIDine  (CATAPRES ) 0.1 MG tablet TAKE ONE (1) TABLET THREE (3) TIMES EACH DAY  08/07/22  Yes Swaziland, Peter M, MD  colchicine  0.6 MG tablet Take 1.2 mg by mouth as needed (gout). Take two tablets (1.2mg ) at onset of gout attack.  May take one tablet (0.6mg ) an hour later if needed.  Maximum of 3 tablets in 3 days. 07/19/23  Yes [provider]  EPINEPHrine 0.3 mg/0.3 mL IJ SOAJ injection INJECT 0.3 ML INTRAMUSCULARLY ONCE AS NEEDED 06/16/21  Yes [provider]  hydrALAZINE  (APRESOLINE ) 50 MG tablet TAKE ONE (1) TABLET THREE (3) TIMES EACH DAY 06/16/21  Yes Swaziland, Peter M, MD  hydrochlorothiazide (HYDRODIURIL) 25 MG tablet Take 12.5 mg by mouth daily. 07/19/23  Yes [provider]  nitroGLYCERIN  (NITROSTAT ) 0.4 MG SL tablet Place 1 tablet (0.4 mg total) under the tongue every 5 (five) minutes as needed for chest pain. 03/09/14  Yes Swaziland, Peter M, MD  olmesartan  (BENICAR )  40 MG tablet TAKE ONE (1) TABLET EACH DAY 08/03/23  Yes Swaziland, Peter M, MD  omeprazole  (PRILOSEC) 20 MG capsule Take 1 capsule (20 mg total) by mouth daily as needed (acid reflux). Patient taking differently: Take 20 mg by mouth daily. 08/08/15  Yes Harl Jayson CROME, MD    Scheduled Meds:  allopurinol   100 mg Oral Daily   amLODipine   10 mg Oral Daily   bisoprolol   5 mg Oral Daily   cloNIDine   0.1 mg Oral Q8H   enoxaparin  (LOVENOX ) injection  40 mg Subcutaneous Q24H   hydrALAZINE   50 mg Oral Q8H   irbesartan   300 mg Oral Daily   Continuous Infusions:  PRN Meds:   Allergies:    Allergies  Allergen Reactions   Bee Venom Swelling   Indocin [Indomethacin] Other (See Comments)    Nose bleeds   Lisinopril Hives    Social History:   Social History   Socioeconomic History   Marital status: Married    Spouse name: Tilton   Number of children: 3   Years of education: Not on file   Highest education level: Not on file  Occupational History   Occupation: Chief Strategy Officer    Employer: [employer]   Occupation: Cut Sales promotion account executive    Comment: Worked with dad and with other  companies for 30+ years  Tobacco Use   Smoking status: Former    Current packs/day: 0.00    Average packs/day: 1.5 packs/day for 30.0 years (45.0 ttl pk-yrs)    Types: Cigarettes    Start date: 04/20/1962    Quit date: 04/20/1992    Years since quitting: 31.5   Smokeless tobacco: Never  Vaping Use   Vaping status: Never Used  Substance and Sexual Activity   Alcohol use: Not Currently    Alcohol/week: 0.0 standard drinks of alcohol    Comment: occasional   Drug use: No   Sexual activity: Not Currently  Other Topics Concern   Not on file  Social History Narrative   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   Social Drivers of Corporate investment banker Strain: Low Risk  (07/02/2017)   Overall Financial Resource Strain (CARDIA)    Difficulty of Paying Living Expenses: Not hard at all  Food Insecurity: No Food Insecurity (07/02/2017)   Hunger Vital Sign    Worried About Running Out of Food in the Last Year: Never true    Ran Out of Food in the Last Year: Never true  Transportation Needs: No Transportation Needs (07/02/2017)   PRAPARE - Administrator, Civil Service (Medical): No    Lack of Transportation (Non-Medical): No  Physical Activity: Insufficiently Active (07/02/2017)   Exercise Vital Sign    Days of Exercise per Week: 3 days    Minutes of Exercise per Session: 30 min  Stress: No Stress Concern Present (07/02/2017)   [employer] of Occupational Health - Occupational Stress Questionnaire    Feeling of Stress : Not at all  Social Connections: Moderately Integrated (07/02/2017)   Social Connection and Isolation Panel    Frequency of Communication with Friends and Family: Three times a week    Frequency of Social Gatherings with Friends and Family: Three times a week    Attends Religious Services: More than 4 times per year    Active Member of Clubs or Organizations: No    Attends Banker Meetings: Never    Marital Status: Married   Catering manager  Violence: Not At Risk (07/02/2017)   Humiliation, Afraid, Rape, and Kick questionnaire    Fear of Current or Ex-Partner: No    Emotionally Abused: No    Physically Abused: No    Sexually Abused: No    Family History:    Family History  Problem Relation Age of Onset   Dementia Mother    Heart failure Father    COPD Father    Colon cancer Neg Hx      ROS:  Please see the history of present illness.   All other ROS reviewed and negative.     Physical Exam/Data: Vitals:   10/30/23 0300 10/30/23 0400 10/30/23 0538 10/30/23 0600  BP: 127/81 (!) 134/50 (!) 149/62 (!) 134/49  Pulse: (!) 56 (!) 59  (!) 53  Resp: 20 17  17   Temp:  98.3 F (36.8 C)    TempSrc:  Oral    SpO2: 97% 95%  97%  Weight:      Height:       No intake or output data in the 24 hours ending 10/30/23 0917    10/29/2023    5:02 PM 03/11/2023    8:06 AM 03/09/2023    9:10 AM  Last 3 Weights  Weight (lbs) 177 lb 14.6 oz 178 lb 178 lb  Weight (kg) 80.7 kg 80.74 kg 80.74 kg     Body mass index is 25.53 kg/m.  General:  Well nourished, well developed, in no acute distress HEENT: normal Neck: no JVD Vascular: No carotid bruits; Distal pulses 2+ bilaterally Cardiac:  normal S1, S2; RRR; no murmur  Lungs:  clear to auscultation bilaterally, no wheezing, rhonchi or rales  Abd: soft, nontender, no hepatomegaly  Ext: no edema Musculoskeletal:  No deformities, BUE and BLE strength normal and equal Skin: warm and dry  Neuro:  CNs 2-12 intact, no focal abnormalities noted Psych:  Normal affect   EKG:  The EKG was personally reviewed and demonstrates:  sinus rhythm with HR 60, nonspecific ST changes Telemetry:  Telemetry was personally reviewed and demonstrates:  sinus bradycardia in the 50s, PVC  Relevant CV Studies:  Echo pending  Laboratory Data: High Sensitivity Troponin:   Recent Labs  Lab 10/29/23 1711 10/29/23 2008  TROPONINIHS 9 10     Chemistry Recent Labs  Lab  10/29/23 1711 10/29/23 2312 10/30/23 0452  NA 139  --  139  K 4.1  --  4.2  CL 105  --  108  CO2 31  --  24  GLUCOSE 95  --  94  BUN 21  --  19  CREATININE 1.87* 1.83* 1.94*  CALCIUM  9.0  --  8.7*  GFRNONAA 37* 38* 35*  ANIONGAP 3*  --  7    No results for input(s): PROT, ALBUMIN, AST, ALT, ALKPHOS, BILITOT in the last 168 hours. Lipids No results for input(s): CHOL, TRIG, HDL, LABVLDL, LDLCALC, CHOLHDL in the last 168 hours.  Hematology Recent Labs  Lab 10/29/23 1711 10/29/23 2312 10/30/23 0452  WBC 11.0* 9.5 7.6  RBC 4.22 4.00* 3.98*  HGB 12.5* 11.9* 12.0*  HCT 38.4* 36.1* 36.2*  MCV 91.0 90.3 91.0  MCH 29.6 29.8 30.2  MCHC 32.6 33.0 33.1  RDW 13.8 13.8 13.8  PLT 224 195 193   Thyroid No results for input(s): TSH, FREET4 in the last 168 hours.  BNPNo results for input(s): BNP, PROBNP in the last 168 hours.  DDimer No results for input(s): DDIMER in the last 168 hours.  Radiology/Studies:  CT Angio Chest PE W and/or Wo Contrast Result Date: 10/29/2023 CLINICAL DATA:  Chest and arm pain EXAM: CT ANGIOGRAPHY CHEST WITH CONTRAST TECHNIQUE: Multidetector CT imaging of the chest was performed using the standard protocol during bolus administration of intravenous contrast. Multiplanar CT image reconstructions and MIPs were obtained to evaluate the vascular anatomy. RADIATION DOSE REDUCTION: This exam was performed according to the departmental dose-optimization program which includes automated exposure control, adjustment of the mA and/or kV according to patient size and/or use of iterative reconstruction technique. CONTRAST:  75mL OMNIPAQUE  IOHEXOL  350 MG/ML SOLN COMPARISON:  Chest x-ray from earlier in the same day, CT from 10/04/2016 FINDINGS: Cardiovascular: Atherosclerotic calcifications of the thoracic aorta are noted. No aneurysmal dilatation or dissection is noted. Mild coronary calcifications are seen. The pulmonary artery shows a normal  branching pattern bilaterally. No intraluminal filling defect to suggest pulmonary embolism is noted. Mediastinum/Nodes: Thoracic inlet is within normal limits. No sizable hilar or mediastinal adenopathy is noted. The esophagus is within normal limits. Lungs/Pleura: Lungs are well aerated bilaterally. Areas of scarring are seen bilaterally as well as emphysematous changes. Scattered small less than 5 mm nodules are noted. A more dominant 2 cm nodular area is noted best seen on image number 93 of series 6. It has some central decreased attenuation and some peripheral areas of increased attenuation. This may be related to scarring from prior aspiration. Short-term follow-up is recommended. No sizable effusion is noted. A focal area of subpleural fat is noted on the right slightly decreased in size from the prior exam. Upper Abdomen: Simple renal cyst is noted in the right kidney. No follow-up is recommended. Musculoskeletal: Degenerative changes of the thoracic spine are seen. No acute rib abnormality is noted. Review of the MIP images confirms the above findings. IMPRESSION: No evidence of pulmonary emboli. 2 cm nodular area within the right lower lobe as described. This has a more inflammatory appearance although follow-up in 3 months is recommended to assess for stability/resolution. No other focal abnormality is noted. Aortic Atherosclerosis (ICD10-I70.0) and Emphysema (ICD10-J43.9). Electronically Signed   By: Oneil Devonshire M.D.   On: 10/29/2023 21:09   DG Chest 2 View Result Date: 10/29/2023 CLINICAL DATA:  Chest and left arm pain for 1 hour, initial encounter EXAM: CHEST - 2 VIEW COMPARISON:  11/12/2017 FINDINGS: Cardiac shadow is within normal limits. Focal eventration of the right hemidiaphragm is again noted. Right basilar atelectasis is seen. No acute bony abnormality is noted. Postsurgical changes in the right shoulder and cervical spine are seen. IMPRESSION: Minimal right basilar atelectasis.  Electronically Signed   By: Oneil Devonshire M.D.   On: 10/29/2023 19:18     Assessment and Plan:  Chest pain - hs troponin x 2 negative - repeat EKG pending - chest pain description concerning for unstable angina, but also on the day he started bladder washes with chemotherapy - rising creatinine, suspect also dehydration  - will first obtain and echocardiogram - if echo largely unremarkable, could consider CT coronary inpatient vs outpatient (unclear if this can be done on the weekend).  - if echo abnormal, will need to stay for likely definitive angiography on Monday pending renal function - continue home 10 mg amlodipine , ASA, 40 mg lipitor, 5 mg bisoprolol  - also on 0.1 mg clonidine  TID, 50 mg hydralazine  TID, and 12.5 mg hydrochlorothiazide  - will add 30 mg imdur - hold 40 mg olmesartan  for now given renal function   A on CKD stage III -  sCr 1.9  today - baseline likely 1.6 - can provide gentle hydration while NPO and obtaining echo - holding ARB, but hydrochlorothiazide still ordered   Hypertension - now managed in the context of increasing antianginal regimen - holding ARB, will add low does imdur   Hyperlipidemia with LDL goal < 70 - continue 40 mg lipitor - will repeat lipid panel   Leukocytosis - WBC 11 --> 7.6 - likely reactive   Bladder cancer - had first chemo bladder washout yesterday    Risk Assessment/Risk Scores:    TIMI Risk Score for Unstable Angina or Non-ST Elevation MI:   The patient's TIMI risk score is 3, which indicates a 13% risk of all cause mortality, new or recurrent myocardial infarction or need for urgent revascularization in the next 14 days.         For questions or updates, please contact [facility] Please consult [email or web address] for contact info under    Signed, Jon Nat Hails, PA  10/30/2023 9:17 AM

## 2023-10-30 NOTE — ED Notes (Signed)
 Patient provided with discharge paperwork. Pt demonstrated understanding of material. All questions, comments, and concerns addressed. Pt ambulated in hallway towards exit with no complications

## 2023-11-02 ENCOUNTER — Telehealth: Payer: Self-pay | Admitting: Cardiology

## 2023-11-02 NOTE — Telephone Encounter (Signed)
 Wife says patient had a bladder wash on 7/11 then ended up in the hospital the same day. Wife would like to know if this may have caused cardiac symptoms. She would like to have ED notes reviewed if possible.

## 2023-11-02 NOTE — Telephone Encounter (Signed)
Spoke to patient's wife Dr.Jordan's advice given. 

## 2023-11-02 NOTE — Telephone Encounter (Signed)
 Spoke to patient's wife.She stated husband had a bladder wash at Dr.Wrenn's office last Friday at 8:00 am.Stated that afternoon after he mowed lawn in hot weather he had chest tightness that lasted appox 15 min.followed by left arm pain that lasted off and on all day.She took patient to [facility] ED and he stayed over night.All test were normal.He has appointment with Jon Hails PA 7/25.Patient is scheduled for another bladder wash this Friday.She wanted to make sure ok to have.She is concerned bladder wash caused chest tightness.Message sent to Dr.Jordan for advice.

## 2023-11-05 DIAGNOSIS — C678 Malignant neoplasm of overlapping sites of bladder: Secondary | ICD-10-CM | POA: Diagnosis not present

## 2023-11-10 NOTE — Progress Notes (Unsigned)
 Cardiology Office Note:    Date:  11/12/2023   ID:  Manuel Barrett, DOB 30-Jul-1945, MRN [id number]  PCP:  [facility] Providers Cardiologist:  Peter Swaziland, MD Cardiology APP:  Madie Jon Garre, PA     Referring MD: [facility]   Chief Complaint  Patient presents with   Follow-up    ER fu for chest pain    History of Present Illness:    Manuel Barrett is a 78 y.o. male with a hx of nonobstructive CAD, HTN, CKD III, PAD, bladder cancer, and GERD.  Mr. Manuel Barrett has a history of nonobstructive CAD on heart cath in 2015. He also has PAD and is s/p fem-fem bypass graft. Last myoview  08/2017 was reassuring, no ischemia, and LVEF 55-65%.    He was last seen by cardiology 02/2023 and cleared for shoulder surgery.    He was diagnosed with bladder cancer and underwent chemotherapy in 2018-2019. Recently found to have a recurrence with cystoscopy. He was advised to restart bladder washes with chemotherapy and had his first session yesterday, 10/29/23.    He presented to [facility] with chest pain on 10/29/23.  he ruled out with negative enzymes.  Of note he has bladder cancer and had his first chemo bladder washout on 10/28/2023.  Echocardiogram was largely unremarkable and he was discharged without further ischemic evaluation with close cardiology follow-up.  He presents today for follow-up. He has had no further chest pain and at least 2 additional treatment on his bladder, last one was this morning. Does not need another for 6 months. Feels well, no cardiac complaints.   Past Medical History:  Diagnosis Date   Arthritis    Bladder tumor    CAD in native artery followed by pcp at the [facility] in [place] 11-13-2016 pt denies S&S   per cardiac cath 03-06-2014  by dr swaziland--  moderate non-obstructive cad   Cancer [facility])    bladder   Chronic kidney disease    Emphysema/COPD (HCC)    GERD (gastroesophageal reflux disease)    Gout    per pt currently  stable 11-13-2016   History of kidney stones    History of MRSA infection    right hand abscess 09/ 2016   Hypertension    Left inguinal hernia    PAD (peripheral artery disease) (HCC) first dx 1994---  was followed by vascular dr dyane   s/p  fem-pop bypass graft 1994 and 2001/  [facility] [place] 11[facility]13-2016 denies s&s   Rotator cuff syndrome of left shoulder    Wears hearing aid in both ears     Past Surgical History:  Procedure Laterality Date   ANTERIOR CERVICAL DECOMP/DISCECTOMY FUSION  02/1999   AORTA - BILATERAL FEMORAL ARTERY BYPASS GRAFT  2001  dr dyane   APPENDECTOMY  1959   BLADDER SURGERY  11/2022   COLONOSCOPY N/A 02/06/2021   Procedure: COLONOSCOPY;  Surgeon: Golda Claudis PENNER, MD;  [facility]: [facility];  Service: Endoscopy;  Laterality: N/A;  10:30   CYSTOSCOPY W/ RETROGRADES Bilateral 10/31/2019   Procedure: CYSTOSCOPY WITH BILATERAL RETROGRADE PYELOGRAM;  Surgeon: Watt Rush, MD;  Location: [facility];  Service: Urology;  Laterality: Bilateral;   CYSTOSCOPY WITH BIOPSY Bilateral 12/02/2021   Procedure: CYSTOSCOPY WITH BLADDER BIOPSY AND FULGURATION BILATERAL  RETROGRADES LEFT URETEROSCOPY  left stent placement;  Surgeon: Watt Rush, MD;  Location: [facility];  Service: Urology;  Laterality: Bilateral;  1HR  FOR CASE   CYSTOSCOPY WITH STENT PLACEMENT Left 11/26/2016   Procedure: CYSTOSCOPY WITH left double J STENT PLACEMENT;  Surgeon: Watt Rush, MD;  Location: [facility];  Service: Urology;  Laterality: Left;   FEMORAL-POPLITEAL BYPASS GRAFT  1994   dr dyane   I & D EXTREMITY Right 01/09/2015   Procedure: IRRIGATION AND DEBRIDEMENT RIGHT THUMB/HAND ;  Surgeon: Elsie Mussel, MD;  Location: [facility] OR;  Service: Orthopedics;  Laterality: Right;   LEFT HEART CATHETERIZATION WITH CORONARY ANGIOGRAM N/A 03/06/2014   Procedure: LEFT HEART CATHETERIZATION WITH CORONARY ANGIOGRAM;  Surgeon: Peter M Swaziland, MD;  Location: [facility] CATH LAB;  Service:  Cardiovascular;  Laterality: N/A;  midLAD 50-60%; D1 20-30%; mCFX 30%; pRCA 30%;  ef 55-65%   LUMBAR SPINE SURGERY  1980's   REVERSE SHOULDER ARTHROPLASTY Right 03/11/2023   Procedure: RIGHT REVERSE SHOULDER ARTHROPLASTY;  Surgeon: Melita Drivers, MD;  Location: [facility];  Service: Orthopedics;  Laterality: Right;    TRANSURETHRAL RESECTION OF BLADDER TUMOR N/A 11/26/2016   Procedure: TRANSURETHRAL RESECTION OF BLADDER TUMOR (TURBT)/ left ureteroscopy;  Surgeon: Watt Rush, MD;  Location: [facility];  Service: Urology;  Laterality: N/A;   TRANSURETHRAL RESECTION OF BLADDER TUMOR WITH MITOMYCIN -C N/A 10/31/2019   Procedure: TRANSURETHRAL RESECTION OF BLADDER TUMOR WITH GEMCITABINE ;  Surgeon: Watt Rush, MD;  Location: [facility];  Service: Urology;  Laterality: N/A;    Current Medications: Current Meds  Medication Sig   allopurinol  (ZYLOPRIM ) 100 MG tablet Take 100 mg by mouth every morning.    amLODipine  (NORVASC ) 10 MG tablet Take 1 tablet (10 mg total) by mouth at bedtime.   aspirin  EC 81 MG tablet Take 1 tablet (81 mg total) by mouth daily.   atorvastatin  (LIPITOR) 40 MG tablet Take 40 mg by mouth at bedtime.   bisoprolol  (ZEBETA ) 5 MG tablet TAKE ONE (1) TABLET EACH DAY   cloNIDine  (CATAPRES ) 0.1 MG tablet TAKE ONE (1) TABLET THREE (3) TIMES EACH DAY   colchicine  0.6 MG tablet Take 1.2 mg by mouth as needed (gout). Take two tablets (1.2mg ) at onset of gout attack.  May take one tablet (0.6mg ) an hour later if needed.  Maximum of 3 tablets in 3 days.   EPINEPHrine 0.3 mg/0.3 mL IJ SOAJ injection INJECT 0.3 ML INTRAMUSCULARLY ONCE AS NEEDED   hydrALAZINE  (APRESOLINE ) 50 MG tablet TAKE ONE (1) TABLET THREE (3) TIMES EACH DAY   nitroGLYCERIN  (NITROSTAT ) 0.4 MG SL tablet Place 1 tablet (0.4 mg total) under the tongue every 5 (five) minutes as needed for chest pain.   olmesartan  (BENICAR ) 40 MG tablet TAKE ONE (1) TABLET EACH DAY   omeprazole  (PRILOSEC) 20 MG capsule Take 1  capsule (20 mg total) by mouth daily as needed (acid reflux). (Patient taking differently: Take 20 mg by mouth daily.)     Allergies:   Bee venom, Indocin [indomethacin], and Lisinopril   Social History   Socioeconomic History   Marital status: Married    Spouse name: Tilton   Number of children: 3   Years of education: Not on file   Highest education level: Not on file  Occupational History   Occupation: Chief Strategy Officer    Employer: [employer]   Occupation: Cut Sales promotion account executive    Comment: Worked with dad and with other companies for 30+ years  Tobacco Use   Smoking status: Former    Current packs/day: 0.00    Average packs/day: 1.5 packs/day for 30.0 years (45.0 ttl pk-yrs)  Types: Cigarettes    Start date: 04/20/1962    Quit date: 04/20/1992    Years since quitting: 31.5   Smokeless tobacco: Never  Vaping Use   Vaping status: Never Used  Substance and Sexual Activity   Alcohol use: Not Currently    Alcohol/week: 0.0 standard drinks of alcohol    Comment: occasional   Drug use: No   Sexual activity: Not Currently  Other Topics Concern   Not on file  Social History Narrative   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   Social Drivers of Corporate investment banker Strain: Low Risk  (07/02/2017)   Overall Financial Resource Strain (CARDIA)    Difficulty of Paying Living Expenses: Not hard at all  Food Insecurity: No Food Insecurity (07/02/2017)   Hunger Vital Sign    Worried About Running Out of Food in the Last Year: Never true    Ran Out of Food in the Last Year: Never true  Transportation Needs: No Transportation Needs (07/02/2017)   PRAPARE - Administrator, Civil Service (Medical): No    Lack of Transportation (Non-Medical): No  Physical Activity: Insufficiently Active (07/02/2017)   Exercise Vital Sign    Days of Exercise per Week: 3 days    Minutes of Exercise per Session: 30 min  Stress: No Stress Concern Present (07/02/2017)   [employer] of Occupational Health - Occupational Stress Questionnaire    Feeling of Stress : Not at all  Social Connections: Moderately Integrated (07/02/2017)   Social Connection and Isolation Panel    Frequency of Communication with Friends and Family: Three times a week    Frequency of Social Gatherings with Friends and Family: Three times a week    Attends Religious Services: More than 4 times per year    Active Member of Clubs or Organizations: No    Attends Banker Meetings: Never    Marital Status: Married     Family History: The patient's family history includes COPD in his father; Dementia in his mother; Heart failure in his father. There is no history of Colon cancer.  ROS:   Please see the history of present illness.     All other systems reviewed and are negative.  EKGs/Labs/Other Studies Reviewed:    The following studies were reviewed today:       Recent Labs: 10/30/2023: BUN 19; Creatinine, Ser 1.94; Hemoglobin 12.0; Platelets 193; Potassium 4.2; Sodium 139  Recent Lipid Panel    Component Value Date/Time   CHOL 138 08/12/2015 0852   TRIG 121 08/12/2015 0852   HDL 30 (L) 08/12/2015 0852   CHOLHDL 4.6 08/12/2015 0852   CHOLHDL 5.4 05/08/2014 0837   VLDL 31 05/08/2014 0837   LDLCALC 84 08/12/2015 0852     Risk Assessment/Calculations:                Physical Exam:    VS:  BP 124/70   Pulse (!) 53   Ht 5' 10 (1.778 m)   Wt 173 lb 12.8 oz (78.8 kg)   SpO2 96%   BMI 24.94 kg/m     Wt Readings from Last 3 Encounters:  11/12/23 173 lb 12.8 oz (78.8 kg)  10/29/23 177 lb 14.6 oz (80.7 kg)  03/11/23 178 lb (80.7 kg)     GEN:  Well nourished, well developed in no acute distress HEENT: Normal NECK: No JVD; No carotid bruits LYMPHATICS: No lymphadenopathy CARDIAC: RRR, no murmurs, rubs, gallops  RESPIRATORY:  Clear to auscultation without rales, wheezing or rhonchi  ABDOMEN: Soft, non-tender, non-distended MUSCULOSKELETAL:  No edema;  No deformity  SKIN: Warm and dry NEUROLOGIC:  Alert and oriented x 3 PSYCHIATRIC:  Normal affect   ASSESSMENT:    1. Coronary artery disease involving native coronary artery of native heart without angina pectoris   2. Essential hypertension   3. Hyperlipidemia with target LDL less than 70   4. Stage 3a chronic kidney disease (HCC)    PLAN:    In order of problems listed above:   CAD  CKD baseline sCr 1.6 -- Nonobstructive disease on prior angiography - Recent admission 10/2023 with chest pain concerning for angina - He ruled out with negative cardiac enzymes and reassuring echocardiogram - He presents today and reports absence of chest pain since his ER visit, likely due to bladder treatment that morning -- he prefers to hold off on additional stress testing and I agree with this -- continue 81 mg ASA and 40 mg lipitor   Hypertension - continue 10 mg amlodipine , 5 mg bisoprolol , 0.1 mg clonidine  TID, 12.5 mg hydrochlorothiazide, 40 mg olmesartan    Bladder cancer - Undergoing bladder washout treatment - last treatment this morning   Follow up as scheduled in Nov.       Medication Adjustments/Labs and Tests Ordered: Current medicines are reviewed at length with the patient today.  Concerns regarding medicines are outlined above.  No orders of the defined types were placed in this encounter.  No orders of the defined types were placed in this encounter.   Patient Instructions  Medication Instructions:  No medication changes were made during today's visit.  *If you need a refill on your cardiac medications before your next appointment, please call your pharmacy*   Lab Work: No labs were ordered during today's visit.  If you have labs (blood work) drawn today and your tests are completely normal, you will receive your results only by: MyChart Message (if you have MyChart) OR A paper copy in the mail If you have any lab test that is abnormal or we need to change  your treatment, we will call you to review the results.   Testing/Procedures: No procedures were ordered during today's visit.    Follow-Up: At [facility], you and your health needs are our priority.  As part of our continuing mission to provide you with exceptional heart care, we have created designated Provider Care Teams.  These Care Teams include your primary Cardiologist (physician) and Advanced Practice Providers (APPs -  Physician Assistants and Nurse Practitioners) who all work together to provide you with the care you need, when you need it.  We recommend signing up for the patient portal called MyChart.  Sign up information is provided on this After Visit Summary.  MyChart is used to connect with patients for Virtual Visits (Telemedicine).  Patients are able to view lab/test results, encounter notes, upcoming appointments, etc.  Non-urgent messages can be sent to your provider as well.   To learn more about what you can do with MyChart, go to [email or web address].       Other Instructions Thank you for choosing [facility]!       Signed, Jon Nat Hails, [place]  11/12/2023 10:21 AM    [facility]

## 2023-11-12 ENCOUNTER — Encounter: Payer: Self-pay | Admitting: Physician Assistant

## 2023-11-12 ENCOUNTER — Ambulatory Visit: Admitting: Physician Assistant

## 2023-11-12 ENCOUNTER — Ambulatory Visit: Attending: Physician Assistant | Admitting: Physician Assistant

## 2023-11-12 VITALS — BP 124/70 | HR 53 | Ht 70.0 in | Wt 173.8 lb

## 2023-11-12 DIAGNOSIS — I2510 Atherosclerotic heart disease of native coronary artery without angina pectoris: Secondary | ICD-10-CM

## 2023-11-12 DIAGNOSIS — E785 Hyperlipidemia, unspecified: Secondary | ICD-10-CM

## 2023-11-12 DIAGNOSIS — Z5111 Encounter for antineoplastic chemotherapy: Secondary | ICD-10-CM | POA: Diagnosis not present

## 2023-11-12 DIAGNOSIS — C678 Malignant neoplasm of overlapping sites of bladder: Secondary | ICD-10-CM | POA: Diagnosis not present

## 2023-11-12 DIAGNOSIS — N1831 Chronic kidney disease, stage 3a: Secondary | ICD-10-CM | POA: Diagnosis not present

## 2023-11-12 DIAGNOSIS — I10 Essential (primary) hypertension: Secondary | ICD-10-CM | POA: Diagnosis not present

## 2023-11-12 NOTE — Patient Instructions (Signed)
 Medication Instructions:  No medication changes were made during today's visit.  *If you need a refill on your cardiac medications before your next appointment, please call your pharmacy*   Lab Work: No labs were ordered during today's visit.  If you have labs (blood work) drawn today and your tests are completely normal, you will receive your results only by: MyChart Message (if you have MyChart) OR A paper copy in the mail If you have any lab test that is abnormal or we need to change your treatment, we will call you to review the results.   Testing/Procedures: No procedures were ordered during today's visit.    Follow-Up: At [facility], you and your health needs are our priority.  As part of our continuing mission to provide you with exceptional heart care, we have created designated Provider Care Teams.  These Care Teams include your primary Cardiologist (physician) and Advanced Practice Providers (APPs -  Physician Assistants and Nurse Practitioners) who all work together to provide you with the care you need, when you need it.  We recommend signing up for the patient portal called MyChart.  Sign up information is provided on this After Visit Summary.  MyChart is used to connect with patients for Virtual Visits (Telemedicine).  Patients are able to view lab/test results, encounter notes, upcoming appointments, etc.  Non-urgent messages can be sent to your provider as well.   To learn more about what you can do with MyChart, go to [email or web address].       Other Instructions Thank you for choosing [facility]!

## 2024-01-19 ENCOUNTER — Other Ambulatory Visit: Payer: Self-pay | Admitting: Cardiology

## 2024-02-07 ENCOUNTER — Other Ambulatory Visit (HOSPITAL_COMMUNITY): Payer: Self-pay | Admitting: Urology

## 2024-02-07 DIAGNOSIS — N281 Cyst of kidney, acquired: Secondary | ICD-10-CM

## 2024-02-07 DIAGNOSIS — K862 Cyst of pancreas: Secondary | ICD-10-CM

## 2024-02-23 NOTE — Progress Notes (Unsigned)
 02/23/2024   [facility] [place].  No chief complaint on file.    Primary Cardiologist: Dr. Helvi Royals   HPI:  78 y.o. Male seen for follow up CAD. 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 Admitted with chest pain on 03/05/14. No MI and cardiac cath with non obstructive disease.  He does have 50-60% in LAD.  He was started on Statins.  Repeat Myoview  in May 2019 was normal. He complained of dyspnea on his last evaluation. PFTs c/w COPD. Now followed by Dr. Darlean. Metoprolol  switched to bisoprolol .  He has been followed at the [facility]. He gets most of his care here. He does note some dyspnea if he walks up hill with some tightness. This has not changed. He has resistant HTN but reports good control with reading 130-136/50-56 at home. Renal duplex in Feb 2022 was negative for RAS but stenosis of SMA was noted- incidental finding and asymptomatic.   On follow up today he is doing well. He does get SOB with exertion- relieved with rest. Saw Dr Darlean last in 2019 - dx with COPD stage 2. Mild chest pain at times. Rare claudication. Not smoking. Active mowing lawns and gardening. Overall feels well. Labs followed at [facility].  He did undergo cystoscopy with TUR of bladder tumor by Dr Watt in August. He is currently having  chemotherapy with bladder washes.  He was referred to nephrology for CKD. States US  is planned. Had extensive lab work there last week.   He presented to [facility] with chest pain on 10/29/23.  he ruled out with negative enzymes.  Of note he has bladder cancer and had his first chemo bladder washout on 10/28/2023.  Echocardiogram was largely unremarkable and he was discharged without further ischemic evaluation with close cardiology follow-up.    Allergies  Allergen Reactions   Bee Venom Swelling   Indocin [Indomethacin] Other (See Comments)    Nose bleeds   Lisinopril Hives    Current Outpatient Medications  Medication  Sig Dispense Refill   allopurinol  (ZYLOPRIM ) 100 MG tablet Take 100 mg by mouth every morning.      amLODipine  (NORVASC ) 10 MG tablet Take 1 tablet (10 mg total) by mouth at bedtime. 90 tablet 1   aspirin  EC 81 MG tablet Take 1 tablet (81 mg total) by mouth daily. 90 tablet 3   atorvastatin  (LIPITOR) 40 MG tablet Take 40 mg by mouth at bedtime.     bisoprolol  (ZEBETA ) 5 MG tablet TAKE ONE (1) TABLET EACH DAY 90 tablet 3   cloNIDine  (CATAPRES ) 0.1 MG tablet TAKE ONE (1) TABLET THREE (3) TIMES EACH DAY 270 tablet 3   colchicine  0.6 MG tablet Take 1.2 mg by mouth as needed (gout). Take two tablets (1.2mg ) at onset of gout attack.  May take one tablet (0.6mg ) an hour later if needed.  Maximum of 3 tablets in 3 days.     EPINEPHrine 0.3 mg/0.3 mL IJ SOAJ injection INJECT 0.3 ML INTRAMUSCULARLY ONCE AS NEEDED     hydrALAZINE  (APRESOLINE ) 50 MG tablet TAKE ONE (1) TABLET THREE (3) TIMES EACH DAY 90 tablet 2   hydrochlorothiazide (HYDRODIURIL) 25 MG tablet Take 12.5 mg by mouth daily. (Patient not taking: Reported on 11/12/2023)     nitroGLYCERIN  (NITROSTAT ) 0.4 MG SL tablet Place 1 tablet (0.4 mg total) under the tongue every 5 (five) minutes as needed for chest pain. 25  tablet 11   olmesartan  (BENICAR ) 40 MG tablet Take 1 tablet (40 mg total) by mouth daily. 90 tablet 1   omeprazole  (PRILOSEC) 20 MG capsule Take 1 capsule (20 mg total) by mouth daily as needed (acid reflux). (Patient taking differently: Take 20 mg by mouth daily.) 90 capsule 1   No current facility-administered medications for this visit.    Past Medical History:  Diagnosis Date   Arthritis    Bladder tumor    CAD in native artery followed by pcp at the [facility] in [place] 11-13-2016 pt denies S&S   per cardiac cath 03-06-2014  by dr Tyjah Hai--  moderate non-obstructive cad   Cancer [facility])    bladder   Chronic kidney disease    Emphysema/COPD    GERD (gastroesophageal reflux disease)    Gout    per pt currently stable 11-13-2016    History of kidney stones    History of MRSA infection    right hand abscess 09/ 2016   Hypertension    Left inguinal hernia    PAD (peripheral artery disease) first dx 1994---  was followed by vascular dr dyane   s/p  fem-pop bypass graft 1994 and 2001/  [facility] [place] [facility]13-2016 denies s&s   Rotator cuff syndrome of left shoulder    Wears hearing aid in both ears     Past Surgical History:  Procedure Laterality Date   ANTERIOR CERVICAL DECOMP/DISCECTOMY FUSION  02/1999   AORTA - BILATERAL FEMORAL ARTERY BYPASS GRAFT  2001  dr dyane   APPENDECTOMY  1959   BLADDER SURGERY  11/2022   COLONOSCOPY N/A 02/06/2021   Procedure: COLONOSCOPY;  Surgeon: Golda Claudis PENNER, MD;  [facility]: [facility];  Service: Endoscopy;  Laterality: N/A;  10:30   CYSTOSCOPY W/ RETROGRADES Bilateral 10/31/2019   Procedure: CYSTOSCOPY WITH BILATERAL RETROGRADE PYELOGRAM;  Surgeon: Watt Rush, MD;  Location: [facility];  Service: Urology;  Laterality: Bilateral;   CYSTOSCOPY WITH BIOPSY Bilateral 12/02/2021   Procedure: CYSTOSCOPY WITH BLADDER BIOPSY AND FULGURATION BILATERAL  RETROGRADES LEFT URETEROSCOPY  left stent placement;  Surgeon: Watt Rush, MD;  Location: [facility];  Service: Urology;  Laterality: Bilateral;  1HR FOR CASE   CYSTOSCOPY WITH STENT PLACEMENT Left 11/26/2016   Procedure: CYSTOSCOPY WITH left double J STENT PLACEMENT;  Surgeon: Watt Rush, MD;  Location: [facility];  Service: Urology;  Laterality: Left;   FEMORAL-POPLITEAL BYPASS GRAFT  1994   dr dyane   I & D EXTREMITY Right 01/09/2015   Procedure: IRRIGATION AND DEBRIDEMENT RIGHT THUMB/HAND ;  Surgeon: Elsie Mussel, MD;  Location: [facility];  Service: Orthopedics;  Laterality: Right;   LEFT HEART CATHETERIZATION WITH CORONARY ANGIOGRAM N/A 03/06/2014   Procedure: LEFT HEART CATHETERIZATION WITH CORONARY ANGIOGRAM;  Surgeon: Camya Haydon M Andra Heslin, MD;  Location: [facility] CATH LAB;  Service: Cardiovascular;  Laterality:  N/A;  midLAD 50-60%; D1 20-30%; mCFX 30%; pRCA 30%;  ef 55-65%   LUMBAR SPINE SURGERY  1980's   REVERSE SHOULDER ARTHROPLASTY Right 03/11/2023   Procedure: RIGHT REVERSE SHOULDER ARTHROPLASTY;  Surgeon: Melita Drivers, MD;  Location: [facility];  Service: Orthopedics;  Laterality: Right;    TRANSURETHRAL RESECTION OF BLADDER TUMOR N/A 11/26/2016   Procedure: TRANSURETHRAL RESECTION OF BLADDER TUMOR (TURBT)/ left ureteroscopy;  Surgeon: Watt Rush, MD;  [facility];  Service: Urology;  Laterality: N/A;   TRANSURETHRAL RESECTION OF BLADDER TUMOR WITH MITOMYCIN -C N/A 10/31/2019   Procedure: TRANSURETHRAL RESECTION OF  BLADDER TUMOR WITH GEMCITABINE ;  Surgeon: Watt Rush, MD;  Location: [facility];  Service: Urology;  Laterality: N/A;    ROS:As noted in HPI. Otherwise ROS is negative.   Wt Readings from Last 3 Encounters:  11/12/23 173 lb 12.8 oz (78.8 kg)  10/29/23 177 lb 14.6 oz (80.7 kg)  03/11/23 178 lb (80.7 kg)    PHYSICAL EXAM There were no vitals taken for this visit. GENERAL:  Well appearing WM in NAD HEENT:  PERRL, EOMI, sclera are clear. Oropharynx is clear. NECK:  No jugular venous distention, carotid upstroke brisk and symmetric, no bruits, no thyromegaly or adenopathy LUNGS:  Clear to auscultation bilaterally CHEST:  Unremarkable HEART:  RRR,  PMI not displaced or sustained,S1 and S2 within normal limits, no S3, no S4: no clicks, no rubs, no murmurs ABD:  Soft, nontender. BS +, no masses or bruits. No hepatomegaly, no splenomegaly EXT:  2 + pulses throughout, no edema, no cyanosis no clubbing SKIN:  Warm and dry.  No rashes NEURO:  Alert and oriented x 3. Cranial nerves II through XII intact. PSYCH:  Cognitively intact   Laboratory data:  Lab Results  Component Value Date   WBC 7.6 10/30/2023   HGB 12.0 (L) 10/30/2023   HCT 36.2 (L) 10/30/2023   PLT 193 10/30/2023   GLUCOSE 94 10/30/2023   CHOL 138 08/12/2015   TRIG 121 08/12/2015   HDL 30  (L) 08/12/2015   LDLCALC 84 08/12/2015   ALT 16 02/22/2020   AST 23 02/22/2020   NA 139 10/30/2023   K 4.2 10/30/2023   CL 108 10/30/2023   CREATININE 1.94 (H) 10/30/2023   BUN 19 10/30/2023   CO2 24 10/30/2023   TSH 1.040 03/05/2014   INR 1.15 03/05/2014   HGBA1C 6.4 (H) 03/05/2014   Labs from the [facility] 08/08/19: cholesterol 140, triglycerides 173, HDL 34, LDL 71. PSA 1.02, A1c 5.8%. CBC normal. Creatinine 1.6 BUN 19. Otherwise CMET normal.  Dated 06/16/21: cholesterol 131, triglycerides 179, HDL 35, LDL 60. CBC normal. Creatinine 1.64. otherwise CMET normal. TSH normal.  Ecg is done today- NSR rate 56. Normal. I have personally reviewed and interpreted this study.    Lexiscan  Myoview  09/01/17: Study Highlights   The left ventricular ejection fraction is normal (55-65%). Nuclear stress EF: 55%. There was no ST segment deviation noted during stress. The study is normal. This is a low risk study.   Echo 10/30/23: IMPRESSIONS     1. Left ventricular ejection fraction, by estimation, is 55 to 60%. The  left ventricle has normal function. The left ventricle has no regional  wall motion abnormalities. Left ventricular diastolic parameters are  consistent with Grade I diastolic  dysfunction (impaired relaxation).   2. Right ventricular systolic function is normal. The right ventricular  size is normal.   3. The mitral valve is abnormal. Trivial mitral valve regurgitation. No  evidence of mitral stenosis.   4. The aortic valve is tricuspid. There is moderate calcification of the  aortic valve. There is moderate thickening of the aortic valve. Aortic  valve regurgitation is not visualized. Aortic valve  sclerosis/calcification is present, without any evidence  of aortic stenosis.   5. The inferior vena cava is normal in size with greater than 50%  respiratory variability, suggesting right atrial pressure of 3 mmHg.   ASSESSMENT AND PLAN 1. 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 Last  Myoveiw in 2019 was normal.  On good antianginal therapy with high dose amlodipine  and bisoprolol . Class  1 symptoms    2. HTN resistant. No RAS. On 5 medications but now with reasonably  good control.   3. Hypercholesterolemia.  On high dose  lipitor. Labs followed at the [facility].LDL at goal 60.  4. PAD s/p bifemoral BPG in 2001. Stable. ABIs in 2019 normal. Good pedal pulses.  5. COPD    6. CKD stage 3b. Now followed by Nephrology.   FU 12  months.

## 2024-02-25 ENCOUNTER — Encounter: Payer: Self-pay | Admitting: Cardiology

## 2024-02-25 ENCOUNTER — Ambulatory Visit: Attending: Cardiology | Admitting: Cardiology

## 2024-02-25 VITALS — BP 150/80 | HR 52 | Ht 70.0 in | Wt 175.0 lb

## 2024-02-25 DIAGNOSIS — I2510 Atherosclerotic heart disease of native coronary artery without angina pectoris: Secondary | ICD-10-CM | POA: Diagnosis not present

## 2024-02-25 DIAGNOSIS — N1831 Chronic kidney disease, stage 3a: Secondary | ICD-10-CM | POA: Diagnosis not present

## 2024-02-25 DIAGNOSIS — I10 Essential (primary) hypertension: Secondary | ICD-10-CM | POA: Diagnosis not present

## 2024-02-25 DIAGNOSIS — E785 Hyperlipidemia, unspecified: Secondary | ICD-10-CM | POA: Diagnosis not present

## 2024-02-25 MED ORDER — NITROGLYCERIN 0.4 MG SL SUBL: 0.4000 mg | SUBLINGUAL_TABLET | SUBLINGUAL | 11 refills | Status: AC | PRN

## 2024-02-25 NOTE — Patient Instructions (Signed)
 Medication Instructions:  Continue same medications *If you need a refill on your cardiac medications before your next appointment, please call your pharmacy*  Lab Work: None ordered  Testing/Procedures: None ordered  Follow-Up: At [facility], you and your health needs are our priority.  As part of our continuing mission to provide you with exceptional heart care, our providers are all part of one team.  This team includes your primary Cardiologist (physician) and Advanced Practice Providers or APPs (Physician Assistants and Nurse Practitioners) who all work together to provide you with the care you need, when you need it.  Your next appointment:  1 year    Call in July to schedule Nov appointment     Provider:  Dr.Jordan   We recommend signing up for the patient portal called MyChart.  Sign up information is provided on this After Visit Summary.  MyChart is used to connect with patients for Virtual Visits (Telemedicine).  Patients are able to view lab/test results, encounter notes, upcoming appointments, etc.  Non-urgent messages can be sent to your provider as well.   To learn more about what you can do with MyChart, go to [email or web address].

## 2024-02-28 ENCOUNTER — Other Ambulatory Visit (HOSPITAL_BASED_OUTPATIENT_CLINIC_OR_DEPARTMENT_OTHER): Payer: Self-pay

## 2024-02-28 MED ORDER — FLUZONE HIGH-DOSE 0.5 ML IM SUSY
0.5000 mL | PREFILLED_SYRINGE | Freq: Once | INTRAMUSCULAR | 0 refills | Status: AC
  Filled 2024-02-28: qty 0.5, 1d supply, fill #0

## 2024-03-24 ENCOUNTER — Ambulatory Visit (HOSPITAL_COMMUNITY)
Admission: RE | Admit: 2024-03-24 | Discharge: 2024-03-24 | Disposition: A | Source: Ambulatory Visit | Attending: Urology | Admitting: Urology

## 2024-03-24 ENCOUNTER — Other Ambulatory Visit (HOSPITAL_COMMUNITY): Payer: Self-pay | Admitting: Urology

## 2024-03-24 DIAGNOSIS — K862 Cyst of pancreas: Secondary | ICD-10-CM

## 2024-03-24 DIAGNOSIS — N281 Cyst of kidney, acquired: Secondary | ICD-10-CM

## 2024-03-24 MED ORDER — GADOBUTROL 1 MMOL/ML IV SOLN
8.0000 mL | Freq: Once | INTRAVENOUS | Status: AC | PRN
  Administered 2024-03-24: 8 mL via INTRAVENOUS
